# Patient Record
Sex: Male | Born: 1955 | Race: Black or African American | Hispanic: No | Marital: Married | Smoking: Former smoker
Health system: Southern US, Community
[De-identification: ages and names within clinical notes are randomized; demographics above are authoritative.]

## PROBLEM LIST (undated history)

## (undated) DIAGNOSIS — N400 Enlarged prostate without lower urinary tract symptoms: Secondary | ICD-10-CM

## (undated) DIAGNOSIS — E7800 Pure hypercholesterolemia, unspecified: Secondary | ICD-10-CM

## (undated) DIAGNOSIS — I10 Essential (primary) hypertension: Secondary | ICD-10-CM

## (undated) DIAGNOSIS — E119 Type 2 diabetes mellitus without complications: Secondary | ICD-10-CM

## (undated) DIAGNOSIS — I340 Nonrheumatic mitral (valve) insufficiency: Secondary | ICD-10-CM

## (undated) DIAGNOSIS — G709 Myoneural disorder, unspecified: Secondary | ICD-10-CM

## (undated) HISTORY — PX: APPENDECTOMY: SHX54

## (undated) HISTORY — DX: Type 2 diabetes mellitus without complications: E11.9

## (undated) HISTORY — DX: Myoneural disorder, unspecified: G70.9

## (undated) HISTORY — PX: GLAUCOMA SURGERY: SHX656

## (undated) HISTORY — PX: LEG SURGERY: SHX1003

---

## 2017-12-09 ENCOUNTER — Encounter: Payer: Self-pay | Admitting: Family Medicine

## 2017-12-16 ENCOUNTER — Other Ambulatory Visit: Payer: Self-pay

## 2017-12-16 ENCOUNTER — Encounter: Payer: Self-pay | Admitting: Nurse Practitioner

## 2017-12-16 ENCOUNTER — Ambulatory Visit (INDEPENDENT_AMBULATORY_CARE_PROVIDER_SITE_OTHER): Payer: Medicare HMO | Admitting: Nurse Practitioner

## 2017-12-16 VITALS — BP 139/88 | HR 70 | Temp 98.1°F | Ht 68.0 in | Wt 227.5 lb

## 2017-12-16 DIAGNOSIS — I10 Essential (primary) hypertension: Secondary | ICD-10-CM | POA: Diagnosis not present

## 2017-12-16 DIAGNOSIS — I152 Hypertension secondary to endocrine disorders: Secondary | ICD-10-CM | POA: Insufficient documentation

## 2017-12-16 DIAGNOSIS — E6609 Other obesity due to excess calories: Secondary | ICD-10-CM | POA: Diagnosis not present

## 2017-12-16 DIAGNOSIS — N401 Enlarged prostate with lower urinary tract symptoms: Secondary | ICD-10-CM

## 2017-12-16 DIAGNOSIS — E669 Obesity, unspecified: Secondary | ICD-10-CM | POA: Insufficient documentation

## 2017-12-16 DIAGNOSIS — E1159 Type 2 diabetes mellitus with other circulatory complications: Secondary | ICD-10-CM | POA: Insufficient documentation

## 2017-12-16 DIAGNOSIS — G709 Myoneural disorder, unspecified: Secondary | ICD-10-CM | POA: Diagnosis not present

## 2017-12-16 DIAGNOSIS — Z23 Encounter for immunization: Secondary | ICD-10-CM

## 2017-12-16 DIAGNOSIS — R351 Nocturia: Secondary | ICD-10-CM

## 2017-12-16 DIAGNOSIS — Z6834 Body mass index (BMI) 34.0-34.9, adult: Secondary | ICD-10-CM

## 2017-12-16 DIAGNOSIS — N400 Enlarged prostate without lower urinary tract symptoms: Secondary | ICD-10-CM | POA: Insufficient documentation

## 2017-12-16 MED ORDER — BACLOFEN 10 MG PO TABS: 10.0000 mg | ORAL_TABLET | Freq: Three times a day (TID) | ORAL | 3 refills | Status: DC

## 2017-12-16 MED ORDER — FINASTERIDE 5 MG PO TABS: 5.0000 mg | ORAL_TABLET | Freq: Every day | ORAL | 3 refills | Status: DC

## 2017-12-16 MED ORDER — TAMSULOSIN HCL 0.4 MG PO CAPS: 0.4000 mg | ORAL_CAPSULE | Freq: Every day | ORAL | 3 refills | Status: DC

## 2017-12-16 NOTE — Assessment & Plan Note (Signed)
Refills sent to pharmacy on Finasteride and Tamsulosin, which pt reports good symptom control with.  Has been out of medication for 4 weeks.  Monitor symptoms and refer to urology as needed.  Labs next visit.

## 2017-12-16 NOTE — Assessment & Plan Note (Signed)
Will have records sent form [place].  Continue Baclofen TID as needed for muscle spasms.  Return in 2-4 weeks for physical with lab work.

## 2017-12-16 NOTE — Assessment & Plan Note (Signed)
Discussed focus on exercise and diet regimen.  Exercise for 30 minutes 5 days a week.

## 2017-12-16 NOTE — Assessment & Plan Note (Signed)
Reports h/o BP medication use.  Not currently.  BP slightly elevated today.   Continue to monitor and initiate medication if continues above goal (<130/90).  Lab work at next visit.

## 2017-12-16 NOTE — Progress Notes (Signed)
Ryan Schmidt   New Patient Office Visit  Subjective:  Patient ID: Ryan Schmidt, male    DOB: [birth year]  Age: 62 y.o. MRN: [id number]  CC:  Chief Complaint  Patient presents with  . Establish Care    pt states he has lost all his medications on his way here from [place] about 4 weeks ago    HPI Ryan Schmidt is a 62yo African American male who presents for new patient visit to establish care.  Introduced to Publishing rights manager role and practice setting.  He reports he lost all of his medications on way here from [place] 4 weeks ago.  Of note he was on Tramadol and discussed policy of office with opioid use, he reports this was an old medication on his list and he has not taken Tramadol in "years".  Obtained care at [facility] clinic in [place], [place].  He reports his wife's aunt got sick and she came to assist her, then informed him she was going to stay here. He moved down here to be with his wife and left all his belongings behind.  BPH Has been off his BPH medications for 4 weeks, due to losing them in [place].  Has had increased symptoms at this time and requests refill.  Reports good control with medication regimen.  He reports h/o HTN, but states his provider in [place] took him of BP medications and initiated BPH meds which have helped his BP.  BPH status: stable Satisfied with current treatment?: yes Medication side effects: no Medication compliance: good compliance Duration: chronic Nocturia: 1-2x per night Urinary frequency:yes Incomplete voiding: no Urgency: yes Weak urinary stream: yes Straining to start stream: yes Dysuria: no Onset: gradual Severity: mild Alleviating factors:  Aggravating factors:  Treatments attempted:  AUA symptom score:   ACUTE MUSCLE DISORDER: He reports he has an acute muscle disorder, which his son also has.  His provider in [place] had him on Baclofen 10 MG three times, which he reports he did not always take three times a day as he does not like taking  pills.  He denies pain at this time.  Has signed form to have records sent to provider from [place].  Was diagnosed 20 or more years ago per his report.  Reports he is retired, continues to exercise a few times a week, and has no issues with function and ADL's.  Functional Status Survey: Is the patient deaf or have difficulty hearing?: No Does the patient have difficulty seeing, even when wearing glasses/contacts?: No Does the patient have difficulty concentrating, remembering, or making decisions?: No Does the patient have difficulty walking or climbing stairs?: No Does the patient have difficulty dressing or bathing?: No Does the patient have difficulty doing errands alone such as visiting a doctor's office or shopping?: No   Depression screen PHQ 2/9 12/16/2017  Decreased Interest 0  Down, Depressed, Hopeless 0  PHQ - 2 Score 0  Altered sleeping 2  Tired, decreased energy 1  Change in appetite 0  Feeling bad or failure about yourself  0  Trouble concentrating 0  Moving slowly or fidgety/restless 0  Suicidal thoughts 0  PHQ-9 Score 3  Difficult doing work/chores Not difficult at all   GAD 7 : Generalized Anxiety Score 12/16/2017  Nervous, Anxious, on Edge 1  Control/stop worrying 1  Worry too much - different things 2  Trouble relaxing 2  Restless 0  Easily annoyed or irritable 1  Afraid - awful might happen 0  Total GAD  7 Score 7  Anxiety Difficulty Not difficult at all    Past Medical History:  Diagnosis Date  . Neuromuscular disorder [facility](HCC)     Past Surgical History:  Procedure Laterality Date  . APPENDECTOMY    . LEG SURGERY     at age 62    Family History  Problem Relation Age of Onset  . Cancer Mother     Social History   Socioeconomic History  . Marital status: Married    Spouse name: Not on file  . Number of children: Not on file  . Years of education: Not on file  . Highest education level: Not on file  Occupational History  . Not on file  Social  Needs  . Financial resource strain: Not very hard  . Food insecurity:    Worry: Never true    Inability: Never true  . Transportation needs:    Medical: No    Non-medical: No  Tobacco Use  . Smoking status: Former Games developermoker  . Smokeless tobacco: Never Used  Substance and Sexual Activity  . Alcohol use: Not Currently  . Drug use: Not Currently  . Sexual activity: Yes  Lifestyle  . Physical activity:    Days per week: Not on file    Minutes per session: Not on file  . Stress: Not on file  Relationships  . Social connections:    Talks on phone: Not on file    Gets together: Not on file    Attends religious service: Not on file    Active member of club or organization: Not on file    Attends meetings of clubs or organizations: Not on file    Relationship status: Not on file  . Intimate partner violence:    Fear of current or ex partner: Not on file    Emotionally abused: Not on file    Physically abused: Not on file    Forced sexual activity: Not on file  Other Topics Concern  . Not on file  Social History Narrative  . Not on file    ROS Review of Systems  Constitutional: Negative for activity change, diaphoresis, fatigue and fever.  Respiratory: Negative for cough, chest tightness, shortness of breath and wheezing.   Cardiovascular: Negative for chest pain, palpitations and leg swelling.  Gastrointestinal: Negative for abdominal distention, abdominal pain, constipation, diarrhea, nausea and vomiting.  Endocrine: Negative for cold intolerance, heat intolerance, polydipsia, polyphagia and polyuria.  Musculoskeletal: Negative.   Skin: Negative.   Neurological: Negative for dizziness, syncope, weakness, light-headedness, numbness and headaches.  Psychiatric/Behavioral: Negative.     Objective:   Today's Vitals: BP 139/88   Pulse 70   Temp 98.1 F (36.7 C) (Oral)   Ht 5\' 8"  (1.727 m)   Wt 227 lb 8 oz (103.2 kg)   SpO2 97%   BMI 34.59 kg/m   Physical Exam    Constitutional: He is oriented to person, place, and time. He appears well-developed and well-nourished.  HENT:  Head: Normocephalic and atraumatic.  Right Ear: Hearing normal. No drainage.  Left Ear: Hearing normal. No drainage.  Mouth/Throat: Uvula is midline and mucous membranes are normal.  Eyes: Pupils are equal, round, and reactive to light. Conjunctivae, EOM and lids are normal. Right eye exhibits no discharge. Left eye exhibits no discharge.  Neck: Trachea normal and normal range of motion. Neck supple. No JVD present. Carotid bruit is not present. No thyromegaly present.  Cardiovascular: Normal rate, regular rhythm, S1 normal, S2 normal and  normal heart sounds. Exam reveals no gallop.  No murmur heard. Pulmonary/Chest: Effort normal and breath sounds normal.  Abdominal: Soft. Bowel sounds are normal. There is no splenomegaly or hepatomegaly.  Musculoskeletal: Normal range of motion.  Neurological: He is alert and oriented to person, place, and time. He has normal reflexes.  Skin: Skin is warm, dry and intact. Capillary refill takes less than 2 seconds. No rash noted.  Psychiatric: He has a normal mood and affect. His behavior is normal. Judgment and thought content normal.  Nursing note and vitals reviewed.   Assessment & Plan:   Problem List Items Addressed This Visit      Cardiovascular and Mediastinum   Essential hypertension    Reports h/o BP medication use.  Not currently.  BP slightly elevated today.   Continue to monitor and initiate medication if continues above goal (<130/90).  Lab work at next visit.        Nervous and Auditory   Neuromuscular disorder (HCC) - Primary    Will have records sent form [place].  Continue Baclofen TID as needed for muscle spasms.  Return in 2-4 weeks for physical with lab work.      Relevant Medications   baclofen (LIORESAL) 10 MG tablet     Genitourinary   BPH (benign prostatic hyperplasia)    Refills sent to pharmacy on  Finasteride and Tamsulosin, which pt reports good symptom control with.  Has been out of medication for 4 weeks.  Monitor symptoms and refer to urology as needed.  Labs next visit.      Relevant Medications   tamsulosin (FLOMAX) 0.4 MG CAPS capsule   finasteride (PROSCAR) 5 MG tablet     Other   Obesity    Discussed focus on exercise and diet regimen.  Exercise for 30 minutes 5 days a week.         Other Visit Diagnoses    Flu vaccine need          Outpatient Encounter Medications as of 12/16/2017  Medication Sig  . baclofen (LIORESAL) 10 MG tablet Take 1 tablet (10 mg total) by mouth 3 (three) times daily.  . finasteride (PROSCAR) 5 MG tablet Take 1 tablet (5 mg total) by mouth daily.  . tamsulosin (FLOMAX) 0.4 MG CAPS capsule Take 1 capsule (0.4 mg total) by mouth daily after supper.  . [DISCONTINUED] aspirin 81 MG tablet Take 81 mg by mouth daily.  . [DISCONTINUED] atorvastatin (LIPITOR) 10 MG tablet Take 10 mg by mouth daily.  . [DISCONTINUED] baclofen (LIORESAL) 10 MG tablet Take 10 mg by mouth 3 (three) times daily.  . [DISCONTINUED] finasteride (PROSCAR) 5 MG tablet Take 5 mg by mouth daily.  . [DISCONTINUED] tamsulosin (FLOMAX) 0.4 MG CAPS capsule Take 0.4 mg by mouth.  . [DISCONTINUED] traMADol (ULTRAM) 50 MG tablet Take by mouth every 6 (six) hours as needed.  . [DISCONTINUED] traMADol (ULTRAM-ER) 300 MG 24 hr tablet Take 300 mg by mouth daily.   No facility-administered encounter medications on file as of 12/16/2017.     Follow-up: Return in about 4 weeks (around 01/13/2018) for annual physical.   Marjie Skiff, NP

## 2017-12-16 NOTE — Patient Instructions (Signed)
Benign Prostatic Hyperplasia  Benign prostatic hyperplasia (BPH) is an enlarged prostate gland that is caused by the normal aging process and not by cancer. The prostate is a walnut-sized gland that is involved in the production of semen. It is located in front of the rectum and below the bladder. The bladder stores urine and the urethra is the tube that carries the urine out of the body. The prostate may get bigger as a man gets older.  An enlarged prostate can press on the urethra. This can make it harder to pass urine. The build-up of urine in the bladder can cause infection. Back pressure and infection may progress to bladder damage and kidney (renal) failure.  What are the causes?  This condition is part of a normal aging process. However, not all men develop problems from this condition. If the prostate enlarges away from the urethra, urine flow will not be blocked. If it enlarges toward the urethra and compresses it, there will be problems passing urine.  What increases the risk?  This condition is more likely to develop in men over the age of 50 years.  What are the signs or symptoms?  Symptoms of this condition include:  · Getting up often during the night to urinate.  · Needing to urinate frequently during the day.  · Difficulty starting urine flow.  · Decrease in size and strength of your urine stream.  · Leaking (dribbling) after urinating.  · Inability to pass urine. This needs immediate treatment.  · Inability to completely empty your bladder.  · Pain when you pass urine. This is more common if there is also an infection.  · Urinary tract infection (UTI).    How is this diagnosed?  This condition is diagnosed based on your medical history, a physical exam, and your symptoms. Tests will also be done, such as:  · A post-void bladder scan. This measures any amount of urine that may remain in your bladder after you finish urinating.  · A digital rectal exam. In a rectal exam, your health care provider  checks your prostate by putting a lubricated, gloved finger into your rectum to feel the back of your prostate gland. This exam detects the size of your gland and any abnormal lumps or growths.  · An exam of your urine (urinalysis).  · A prostate specific antigen (PSA) screening. This is a blood test used to screen for prostate cancer.  · An ultrasound. This test uses sound waves to electronically produce a picture of your prostate gland.    Your health care provider may refer you to a specialist in kidney and prostate diseases (urologist).  How is this treated?  Once symptoms begin, your health care provider will monitor your condition (active surveillance or watchful waiting). Treatment for this condition will depend on the severity of your condition. Treatment may include:  · Observation and yearly exams. This may be the only treatment needed if your condition and symptoms are mild.  · Medicines to relieve your symptoms, including:  ? Medicines to shrink the prostate.  ? Medicines to relax the muscle of the prostate.  · Surgery in severe cases. Surgery may include:  ? Prostatectomy. In this procedure, the prostate tissue is removed completely through an open incision or with a laparascope or robotics.  ? Transurethral resection of the prostate (TURP). In this procedure, a tool is inserted through the opening at the tip of the penis (urethra). It is used to cut away tissue of   the inner core of the prostate. The pieces are removed through the same opening of the penis. This removes the blockage.  ? Transurethral incision (TUIP). In this procedure, small cuts are made in the prostate. This lessens the prostate's pressure on the urethra.  ? Transurethral microwave thermotherapy (TUMT). This procedure uses microwaves to create heat. The heat destroys and removes a small amount of prostate tissue.  ? Transurethral needle ablation (TUNA). This procedure uses radio frequencies to destroy and remove a small amount of  prostate tissue.  ? Interstitial laser coagulation (ILC). This procedure uses a laser to destroy and remove a small amount of prostate tissue.  ? Transurethral electrovaporization (TUVP). This procedure uses electrodes to destroy and remove a small amount of prostate tissue.  ? Prostatic urethral lift. This procedure inserts an implant to push the lobes of the prostate away from the urethra.    Follow these instructions at home:  · Take over-the-counter and prescription medicines only as told by your health care provider.  · Monitor your symptoms for any changes. Contact your health care provider with any changes.  · Avoid drinking large amounts of liquid before going to bed or out in public.  · Avoid or reduce how much caffeine or alcohol you drink.  · Give yourself time when you urinate.  · Keep all follow-up visits as told by your health care provider. This is important.  Contact a health care provider if:  · You have unexplained back pain.  · Your symptoms do not get better with treatment.  · You develop side effects from the medicine you are taking.  · Your urine becomes very dark or has a bad smell.  · Your lower abdomen becomes distended and you have trouble passing your urine.  Get help right away if:  · You have a fever or chills.  · You suddenly cannot urinate.  · You feel lightheaded, or very dizzy, or you faint.  · There are large amounts of blood or clots in the urine.  · Your urinary problems become hard to manage.  · You develop moderate to severe low back or flank pain. The flank is the side of your body between the ribs and the hip.  These symptoms may represent a serious problem that is an emergency. Do not wait to see if the symptoms will go away. Get medical help right away. Call your local emergency services (911 in the [place] Do not drive yourself to the hospital.  Summary  · Benign prostatic hyperplasia (BPH) is an enlarged prostate that is caused by the normal aging process and not by  cancer.  · An enlarged prostate can press on the urethra. This can make it hard to pass urine.  · This condition is part of a normal aging process and is more likely to develop in men over the age of 50 years.  · Get help right away if you suddenly cannot urinate.  This information is not intended to replace advice given to you by your health care provider. Make sure you discuss any questions you have with your health care provider.  Document Released: 12/23/2004 Document Revised: 01/28/2016 Document Reviewed: 01/28/2016  Elsevier Interactive Patient Education © 2018 [employer]

## 2017-12-21 ENCOUNTER — Ambulatory Visit: Payer: Self-pay | Admitting: Nurse Practitioner

## 2018-01-13 ENCOUNTER — Encounter: Payer: Self-pay | Admitting: Nurse Practitioner

## 2018-01-15 ENCOUNTER — Encounter: Payer: Self-pay | Admitting: Nurse Practitioner

## 2018-01-20 ENCOUNTER — Encounter: Payer: Self-pay | Admitting: Nurse Practitioner

## 2018-01-20 ENCOUNTER — Ambulatory Visit (INDEPENDENT_AMBULATORY_CARE_PROVIDER_SITE_OTHER): Payer: Medicare HMO | Admitting: Nurse Practitioner

## 2018-01-20 ENCOUNTER — Other Ambulatory Visit: Payer: Self-pay

## 2018-01-20 VITALS — BP 138/76 | HR 71 | Temp 98.1°F | Ht 68.0 in | Wt 235.0 lb

## 2018-01-20 DIAGNOSIS — Z0000 Encounter for general adult medical examination without abnormal findings: Secondary | ICD-10-CM

## 2018-01-20 DIAGNOSIS — G709 Myoneural disorder, unspecified: Secondary | ICD-10-CM | POA: Diagnosis not present

## 2018-01-20 DIAGNOSIS — E6609 Other obesity due to excess calories: Secondary | ICD-10-CM

## 2018-01-20 DIAGNOSIS — I10 Essential (primary) hypertension: Secondary | ICD-10-CM | POA: Diagnosis not present

## 2018-01-20 DIAGNOSIS — R351 Nocturia: Secondary | ICD-10-CM

## 2018-01-20 DIAGNOSIS — E785 Hyperlipidemia, unspecified: Secondary | ICD-10-CM | POA: Insufficient documentation

## 2018-01-20 DIAGNOSIS — Z6834 Body mass index (BMI) 34.0-34.9, adult: Secondary | ICD-10-CM

## 2018-01-20 DIAGNOSIS — Z23 Encounter for immunization: Secondary | ICD-10-CM

## 2018-01-20 DIAGNOSIS — Z114 Encounter for screening for human immunodeficiency virus [HIV]: Secondary | ICD-10-CM

## 2018-01-20 DIAGNOSIS — N401 Enlarged prostate with lower urinary tract symptoms: Secondary | ICD-10-CM

## 2018-01-20 DIAGNOSIS — E782 Mixed hyperlipidemia: Secondary | ICD-10-CM | POA: Diagnosis not present

## 2018-01-20 DIAGNOSIS — Z1159 Encounter for screening for other viral diseases: Secondary | ICD-10-CM

## 2018-01-20 DIAGNOSIS — Z125 Encounter for screening for malignant neoplasm of prostate: Secondary | ICD-10-CM

## 2018-01-20 DIAGNOSIS — Z1329 Encounter for screening for other suspected endocrine disorder: Secondary | ICD-10-CM

## 2018-01-20 DIAGNOSIS — E1169 Type 2 diabetes mellitus with other specified complication: Secondary | ICD-10-CM | POA: Insufficient documentation

## 2018-01-20 MED ORDER — ZOSTER VAC RECOMB ADJUVANTED 50 MCG/0.5ML IM SUSR: 0.5000 mL | Freq: Once | INTRAMUSCULAR | 0 refills | Status: AC

## 2018-01-20 MED ORDER — TAMSULOSIN HCL 0.4 MG PO CAPS: 0.4000 mg | ORAL_CAPSULE | Freq: Every day | ORAL | 3 refills | Status: DC

## 2018-01-20 NOTE — Assessment & Plan Note (Signed)
Awaiting records from [place].  Continue Baclofen.  Thyroid screen today.

## 2018-01-20 NOTE — Progress Notes (Signed)
BP 138/76 (BP Location: Left Arm, Patient Position: Sitting)   Pulse 71   Temp 98.1 F (36.7 C) (Oral)   Ht 5\' 8"  (1.727 m)   Wt 235 lb (106.6 kg)   SpO2 96%   BMI 35.73 kg/m    Subjective:    Patient ID: Ryan Schmidt, male    DOB: 11/02/1955, 63 y.o.   MRN: [id number]  HPI: Ryan Schmidt is a 63 y.o. male presenting on 01/20/2018 for comprehensive medical examination. Current medical complaints include:none  He currently lives with: Interim Problems from his last visit: no   HYPERTENSION / HYPERLIPIDEMIA He is going to call [employer] to find out which BP medication he was taking for provider to order, as he does not recall. Satisfied with current treatment? yes Duration of hypertension: years BP monitoring frequency: not checking BP range:  BP medication side effects: no Duration of hyperlipidemia: years Cholesterol medication side effects: no Cholesterol supplements: none Medication compliance: good compliance Aspirin: no Recent stressors: yes Recurrent headaches: no Visual changes: no Palpitations: no Dyspnea: no Chest pain: no Lower extremity edema: no Dizzy/lightheaded: no   BPH BPH status: controlled Satisfied with current treatment?: yes Medication side effects: no Medication compliance: good compliance Duration: years Nocturia: 1/night Urinary frequency:no Incomplete voiding: no Urgency: no Weak urinary stream: no Straining to start stream: no Dysuria: no Severity: mild  NEUROMUSCULAR DISORDER: Chronic, ongoing.  Reports no issues as long as take Baclofen, takes one in morning and one at night which helps.  His son has same muscle disorder.     Functional Status Survey: Is the patient deaf or have difficulty hearing?: No Does the patient have difficulty seeing, even when wearing glasses/contacts?: No Does the patient have difficulty concentrating, remembering, or making decisions?: No Does the patient have difficulty walking or climbing stairs?:  No Does the patient have difficulty dressing or bathing?: No Does the patient have difficulty doing errands alone such as visiting a doctor's office or shopping?: No  Depression Screen Depression screen [facility] 2/9 01/20/2018 12/16/2017  Decreased Interest 1 0  Down, Depressed, Hopeless 0 0  PHQ - 2 Score 1 0  Altered sleeping 2 2  Tired, decreased energy 1 1  Change in appetite 0 0  Feeling bad or failure about yourself  0 0  Trouble concentrating 0 0  Moving slowly or fidgety/restless 0 0  Suicidal thoughts 0 0  PHQ-9 Score 4 3  Difficult doing work/chores Not difficult at all Not difficult at all    Advanced Directives <no information>  Past Medical History:  Past Medical History:  Diagnosis Date  . Neuromuscular disorder [facility])     Surgical History:  Past Surgical History:  Procedure Laterality Date  . APPENDECTOMY    . LEG SURGERY     at age 31    Medications:  Current Outpatient Medications on File Prior to Visit  Medication Sig  . atorvastatin (LIPITOR) 10 MG tablet Take 10 mg by mouth daily.  . baclofen (LIORESAL) 10 MG tablet Take 1 tablet (10 mg total) by mouth 3 (three) times daily.   No current facility-administered medications on file prior to visit.     Allergies:  No Known Allergies  Social History:  Social History   Socioeconomic History  . Marital status: Married    Spouse name: Not on file  . Number of children: Not on file  . Years of education: Not on file  . Highest education level: Not on file  Occupational History  .  Not on file  Social Needs  . Financial resource strain: Not very hard  . Food insecurity:    Worry: Never true    Inability: Never true  . Transportation needs:    Medical: No    Non-medical: No  Tobacco Use  . Smoking status: Former Games developer  . Smokeless tobacco: Never Used  Substance and Sexual Activity  . Alcohol use: Not Currently  . Drug use: Not Currently  . Sexual activity: Yes  Lifestyle  . Physical  activity:    Days per week: 0 days    Minutes per session: 0 min  . Stress: Only a little  Relationships  . Social connections:    Talks on phone: More than three times a week    Gets together: More than three times a week    Attends religious service: More than 4 times per year    Active member of club or organization: No    Attends meetings of clubs or organizations: Never    Relationship status: Married  . Intimate partner violence:    Fear of current or ex partner: No    Emotionally abused: No    Physically abused: No    Forced sexual activity: No  Other Topics Concern  . Not on file  Social History Narrative  . Not on file   Social History   Tobacco Use  Smoking Status Former Smoker  Smokeless Tobacco Never Used   Social History   Substance and Sexual Activity  Alcohol Use Not Currently    Family History:  Family History  Problem Relation Age of Onset  . Cancer Mother     Past medical history, surgical history, medications, allergies, family history and social history reviewed with patient today and changes made to appropriate areas of the chart.   Review of Systems - Negative All other ROS negative except what is listed above and in the HPI.      Objective:    BP 138/76 (BP Location: Left Arm, Patient Position: Sitting)   Pulse 71   Temp 98.1 F (36.7 C) (Oral)   Ht 5\' 8"  (1.727 m)   Wt 235 lb (106.6 kg)   SpO2 96%   BMI 35.73 kg/m   Wt Readings from Last 3 Encounters:  01/20/18 235 lb (106.6 kg)  12/16/17 227 lb 8 oz (103.2 kg)    Physical Exam Vitals signs and nursing note reviewed.  Constitutional:      General: He is awake.     Appearance: He is well-developed.  HENT:     Head: Normocephalic and atraumatic.     Right Ear: Hearing, tympanic membrane, ear canal and external ear normal. No drainage.     Left Ear: Hearing, tympanic membrane, ear canal and external ear normal. No drainage.     Nose: Nose normal.     Right Sinus: No maxillary  sinus tenderness or frontal sinus tenderness.     Left Sinus: No maxillary sinus tenderness or frontal sinus tenderness.     Mouth/Throat:     Lips: Pink.     Mouth: Mucous membranes are moist.     Pharynx: Uvula midline. No oropharyngeal exudate or posterior oropharyngeal erythema.  Eyes:     General: Lids are normal.        Right eye: No discharge.        Left eye: No discharge.     Conjunctiva/sclera: Conjunctivae normal.     Pupils: Pupils are equal, round, and reactive to light.  Neck:     Musculoskeletal: Normal range of motion and neck supple.     Thyroid: No thyromegaly.     Vascular: No carotid bruit or JVD.     Trachea: Trachea normal.  Cardiovascular:     Rate and Rhythm: Normal rate and regular rhythm.     Pulses: Normal pulses.     Heart sounds: Normal heart sounds, S1 normal and S2 normal. No murmur. No gallop.   Pulmonary:     Effort: Pulmonary effort is normal.     Breath sounds: Normal breath sounds.  Abdominal:     General: Bowel sounds are normal.     Palpations: Abdomen is soft. There is no hepatomegaly or splenomegaly.  Genitourinary:    Comments: Prostate exam deferred per patient request, will obtain PSA Musculoskeletal: Normal range of motion.     Comments: Normal strength BUE and BLE.  Skin:    General: Skin is warm and dry.     Capillary Refill: Capillary refill takes less than 2 seconds.     Findings: No rash.  Neurological:     Mental Status: He is alert and oriented to person, place, and time.     Cranial Nerves: Cranial nerves are intact.     Motor: Motor function is intact.     Coordination: Coordination is intact.     Gait: Gait is intact.     Deep Tendon Reflexes: Reflexes are normal and symmetric.  Psychiatric:        Attention and Perception: Attention normal.        Mood and Affect: Mood normal.        Speech: Speech normal.        Behavior: Behavior normal. Behavior is cooperative.        Thought Content: Thought content normal.         Cognition and Memory: Cognition normal.        Judgment: Judgment normal.    No results found for this or any previous visit.    Assessment & Plan:   Problem List Items Addressed This Visit      Cardiovascular and Mediastinum   Essential hypertension    Chronic, ongoing.  Initial BP elevated with improvement on recheck.  Encouraged checking BP at home three times a week.  Adjust dose as needed.  CMP and CBC today.      Relevant Medications   atorvastatin (LIPITOR) 10 MG tablet   Other Relevant Orders   CBC with Differential/Platelet   Comprehensive metabolic panel     Nervous and Auditory   Neuromuscular disorder (HCC)    Awaiting records from [place].  Continue Baclofen.  Thyroid screen today.        Genitourinary   BPH (benign prostatic hyperplasia)    Chronic, stable with Flomax.  Continue current regimen.  PSA today.      Relevant Medications   tamsulosin (FLOMAX) 0.4 MG CAPS capsule     Other   Obesity    Focus on returning to exercise regimen and maintaining healthy diet.      Mixed hyperlipidemia    Chronic, ongoing.  Continue Atorvastatin.  Lipid panel today.      Relevant Medications   atorvastatin (LIPITOR) 10 MG tablet   Other Relevant Orders   Comprehensive metabolic panel   Lipid Panel w/o Chol/HDL Ratio    Other Visit Diagnoses    Encounter for annual physical exam    -  Primary   Need for hepatitis C screening test  Relevant Orders   Hepatitis C antibody   Encounter for screening for HIV       Relevant Orders   HIV Antibody (routine testing w rflx)   Prostate cancer screening       Relevant Orders   PSA   Thyroid disorder screen       Relevant Orders   TSH   Flu vaccine need       Relevant Orders   Flu Vaccine QUAD 36+ mos IM (Completed)       Discussed aspirin prophylaxis for myocardial infarction prevention and decision was it was not indicated  LABORATORY TESTING:  Health maintenance labs ordered today as discussed  above.   The natural history of prostate cancer and ongoing controversy regarding screening and potential treatment outcomes of prostate cancer has been discussed with the patient. The meaning of a false positive PSA and a false negative PSA has been discussed. He indicates understanding of the limitations of this screening test and wishes to proceed with screening PSA testing.   IMMUNIZATIONS:   - Tdap: Tetanus vaccination status reviewed: he thinks he received 2-3 years ago (awaiting records from past PCP) - Influenza: ordered - Pneumovax: Refused - Prevnar: Refused - Zostavax vaccine: ordered  SCREENING: - Colonoscopy: Up to date , has last year Discussed with patient purpose of the colonoscopy is to detect colon cancer at curable precancerous or early stages   - AAA Screening: Not applicable  -Hearing Test: Not applicable  -Spirometry: Not applicable   PATIENT COUNSELING:    Sexuality: Discussed sexually transmitted diseases, partner selection, use of condoms, avoidance of unintended pregnancy  and contraceptive alternatives.   Advised to avoid cigarette smoking.  I discussed with the patient that most people either abstain from alcohol or drink within safe limits (<=14/week and <=4 drinks/occasion for males, <=7/weeks and <= 3 drinks/occasion for females) and that the risk for alcohol disorders and other health effects rises proportionally with the number of drinks per week and how often a drinker exceeds daily limits.  Discussed cessation/primary prevention of drug use and availability of treatment for abuse.   Diet: Encouraged to adjust caloric intake to maintain  or achieve ideal body weight, to reduce intake of dietary saturated fat and total fat, to limit sodium intake by avoiding high sodium foods and not adding table salt, and to maintain adequate dietary potassium and calcium preferably from fresh fruits, vegetables, and low-fat dairy products.    stressed the importance  of regular exercise  Injury prevention: Discussed safety belts, safety helmets, smoke detector, smoking near bedding or upholstery.   Dental health: Discussed importance of regular tooth brushing, flossing, and dental visits.   Follow up plan: NEXT PREVENTATIVE PHYSICAL DUE IN 1 YEAR. Return in about 3 months (around 04/21/2018) for HTN/HLD.

## 2018-01-20 NOTE — Assessment & Plan Note (Signed)
Chronic, ongoing.  Continue Atorvastatin.  Lipid panel today.

## 2018-01-20 NOTE — Patient Instructions (Signed)

## 2018-01-20 NOTE — Assessment & Plan Note (Signed)
Chronic, ongoing.  Initial BP elevated with improvement on recheck.  Encouraged checking BP at home three times a week.  Adjust dose as needed.  CMP and CBC today.

## 2018-01-20 NOTE — Assessment & Plan Note (Signed)
Chronic, stable with Flomax.  Continue current regimen.  PSA today.

## 2018-01-20 NOTE — Assessment & Plan Note (Signed)
Focus on returning to exercise regimen and maintaining healthy diet.

## 2018-01-21 ENCOUNTER — Other Ambulatory Visit: Payer: Self-pay | Admitting: Nurse Practitioner

## 2018-01-21 LAB — PSA: Prostate Specific Ag, Serum: 1 ng/mL (ref 0.0–4.0)

## 2018-01-21 LAB — LIPID PANEL W/O CHOL/HDL RATIO
Cholesterol, Total: 223 mg/dL — ABNORMAL HIGH (ref 100–199)
HDL: 41 mg/dL (ref >39)
LDL CALC: 160 mg/dL — AB (ref 0–99)
Triglycerides: 112 mg/dL (ref 0–149)
VLDL Cholesterol Cal: 22 mg/dL (ref 5–40)

## 2018-01-21 LAB — CBC WITH DIFFERENTIAL/PLATELET
Basophils Absolute: 0.1 10*3/uL (ref 0.0–0.2)
Basos: 1 %
EOS (ABSOLUTE): 0.1 10*3/uL (ref 0.0–0.4)
Eos: 2 %
Hematocrit: 41.6 % (ref 37.5–51.0)
Hemoglobin: 13.7 g/dL (ref 13.0–17.7)
Immature Grans (Abs): 0 10*3/uL (ref 0.0–0.1)
Immature Granulocytes: 0 %
Lymphocytes Absolute: 2.3 10*3/uL (ref 0.7–3.1)
Lymphs: 46 %
MCH: 31 pg (ref 26.6–33.0)
MCHC: 32.9 g/dL (ref 31.5–35.7)
MCV: 94 fL (ref 79–97)
Monocytes Absolute: 0.3 10*3/uL (ref 0.1–0.9)
Monocytes: 7 %
Neutrophils Absolute: 2.2 10*3/uL (ref 1.4–7.0)
Neutrophils: 44 %
Platelets: 213 10*3/uL (ref 150–450)
RBC: 4.42 x10E6/uL (ref 4.14–5.80)
RDW: 13.3 % (ref 11.6–15.4)
WBC: 5 10*3/uL (ref 3.4–10.8)

## 2018-01-21 LAB — TSH: TSH: 1.81 u[IU]/mL (ref 0.450–4.500)

## 2018-01-21 LAB — COMPREHENSIVE METABOLIC PANEL
ALT: 19 IU/L (ref 0–44)
AST: 19 IU/L (ref 0–40)
Albumin/Globulin Ratio: 1.4 (ref 1.2–2.2)
Albumin: 4.5 g/dL (ref 3.6–4.8)
Alkaline Phosphatase: 88 IU/L (ref 39–117)
BUN/Creatinine Ratio: 10 (ref 10–24)
BUN: 9 mg/dL (ref 8–27)
Bilirubin Total: 0.6 mg/dL (ref 0.0–1.2)
CO2: 24 mmol/L (ref 20–29)
Calcium: 9.7 mg/dL (ref 8.6–10.2)
Chloride: 100 mmol/L (ref 96–106)
Creatinine, Ser: 0.92 mg/dL (ref 0.76–1.27)
GFR calc Af Amer: 102 mL/min/{1.73_m2} (ref >59)
GFR calc non Af Amer: 88 mL/min/{1.73_m2} (ref >59)
Globulin, Total: 3.3 g/dL (ref 1.5–4.5)
Glucose: 107 mg/dL — ABNORMAL HIGH (ref 65–99)
POTASSIUM: 4.5 mmol/L (ref 3.5–5.2)
Sodium: 139 mmol/L (ref 134–144)
Total Protein: 7.8 g/dL (ref 6.0–8.5)

## 2018-01-21 LAB — HIV ANTIBODY (ROUTINE TESTING W REFLEX): HIV Screen 4th Generation wRfx: NONREACTIVE

## 2018-01-21 LAB — HEPATITIS C ANTIBODY: Hep C Virus Ab: <0.1 s/co ratio (ref 0.0–0.9)

## 2018-01-21 MED ORDER — ATORVASTATIN CALCIUM 20 MG PO TABS: 20.0000 mg | ORAL_TABLET | Freq: Every day | ORAL | 3 refills | Status: DC

## 2018-01-21 NOTE — Progress Notes (Signed)
Increase Atorvastatin to 20 MG based on recent labs.

## 2018-01-22 ENCOUNTER — Telehealth: Payer: Self-pay

## 2018-01-22 ENCOUNTER — Other Ambulatory Visit: Payer: Self-pay | Admitting: Nurse Practitioner

## 2018-01-22 MED ORDER — ATORVASTATIN CALCIUM 40 MG PO TABS: 40.0000 mg | ORAL_TABLET | Freq: Every day | ORAL | 3 refills | Status: DC

## 2018-01-22 MED ORDER — AMLODIPINE BESYLATE 5 MG PO TABS: 5.0000 mg | ORAL_TABLET | Freq: Every day | ORAL | 3 refills | Status: DC

## 2018-01-22 MED ORDER — BACLOFEN 10 MG PO TABS: 10.0000 mg | ORAL_TABLET | Freq: Three times a day (TID) | ORAL | 3 refills | Status: DC

## 2018-01-22 MED ORDER — TAMSULOSIN HCL 0.4 MG PO CAPS: 0.4000 mg | ORAL_CAPSULE | Freq: Every day | ORAL | 3 refills | Status: DC

## 2018-01-22 NOTE — Telephone Encounter (Signed)
Incoming fax from [facility] refill wanted for the following: Not all on his current med list Hydrochlorothiazide tamsulosin- sent yesterday Amlodipine  Atorvastatin - sent yesterday baclofen

## 2018-01-22 NOTE — Telephone Encounter (Signed)
I don't have doses for these.  Can you call [employer] and find out for detail.  Thanks.

## 2018-01-22 NOTE — Telephone Encounter (Signed)
HCTZ 25mg  Tamsulosin 0.4mg  Amlodipine 5mg  Atorvastatin 40mg   Baclofen 10mg 

## 2018-01-22 NOTE — Telephone Encounter (Signed)
Thank you very much.  All has been sent:)

## 2018-01-22 NOTE — Progress Notes (Signed)
Patient and [employer] called in updated medication list and medications that require refills. Amlodipine, Baclofen, Atorvastatin, and Flomax refills sent.

## 2018-01-28 ENCOUNTER — Other Ambulatory Visit: Payer: Self-pay | Admitting: Nurse Practitioner

## 2018-01-28 MED ORDER — HYDROCHLOROTHIAZIDE 25 MG PO TABS: 25.0000 mg | ORAL_TABLET | Freq: Every day | ORAL | 3 refills | Status: DC

## 2018-01-28 NOTE — Progress Notes (Signed)
[employer] notified office that all scripts were received, except for HCTZ 25 MG.  Will send script at this time.

## 2018-01-28 NOTE — Telephone Encounter (Signed)
[employer] called and said they received all the scripts except the HCTZ 25mg . I do not see this medication on patient's list. Please advise

## 2018-01-28 NOTE — Telephone Encounter (Signed)
Sent script

## 2018-02-01 NOTE — Telephone Encounter (Signed)
Please let him know not to take then, it was on report from the company and they stated he was taking this.

## 2018-02-01 NOTE — Telephone Encounter (Signed)
Medication: hydrochlorothiazide (HYDRODIURIL) 25 MG tablet Pt states his dr in [place] advised him not to take this med. Pt states he received in mailorder and this Rx was included.  Wanted you to let you know he is not supposed to take.

## 2018-02-02 NOTE — Telephone Encounter (Signed)
Message relayed to patient. Verbalized understanding and denied questions.   

## 2018-03-08 ENCOUNTER — Telehealth: Payer: Self-pay | Admitting: Nurse Practitioner

## 2018-03-08 NOTE — Telephone Encounter (Unsigned)
Copied from CRM [id number]. Topic: Medicare AWV >> Mar 08, 2018  4:52 PM Earlyne Iba wrote: Called to schedule Medicare Annual Wellness Visit with the Nurse Health Advisor. No answer, and no machine to leave a message.  If patient returns call, please schedule AWV with [facility] any date.  Patient is due for AWV-I now.  Thank you! For any questions please contact: Trixie Rude at [phone] or Skype [email or web address]

## 2018-04-21 ENCOUNTER — Ambulatory Visit (INDEPENDENT_AMBULATORY_CARE_PROVIDER_SITE_OTHER): Payer: Medicare HMO | Admitting: Nurse Practitioner

## 2018-04-21 ENCOUNTER — Encounter: Payer: Self-pay | Admitting: Nurse Practitioner

## 2018-04-21 ENCOUNTER — Other Ambulatory Visit: Payer: Self-pay

## 2018-04-21 VITALS — BP 154/110 | Ht 68.0 in | Wt 239.0 lb

## 2018-04-21 DIAGNOSIS — I10 Essential (primary) hypertension: Secondary | ICD-10-CM | POA: Diagnosis not present

## 2018-04-21 DIAGNOSIS — E782 Mixed hyperlipidemia: Secondary | ICD-10-CM | POA: Diagnosis not present

## 2018-04-21 MED ORDER — HYDROCHLOROTHIAZIDE 25 MG PO TABS: 25.0000 mg | ORAL_TABLET | Freq: Every day | ORAL | 3 refills | Status: DC

## 2018-04-21 MED ORDER — AMLODIPINE BESYLATE 5 MG PO TABS: 5.0000 mg | ORAL_TABLET | Freq: Every day | ORAL | 3 refills | Status: DC

## 2018-04-21 NOTE — Progress Notes (Signed)
BP (!) 154/110   Ht  (1.727 m)   Wt 239 lb (108.4 kg)   BMI 36.34 kg/m    Subjective:    Patient ID: Ryan Schmidt, male    DOB: 17-Apr-1955, 63 y.o.   MRN: [id number]  HPI: Ryan Schmidt is a 63 y.o. male  Chief Complaint  Patient presents with  . Follow-up  . Hypertension  . Hyperlipidemia    . This visit was completed via telephone due to the restrictions of the COVID-19 pandemic. All issues as above were discussed and addressed but no physical exam was performed. If it was felt that the patient should be evaluated in the office, they were directed there. The patient verbally consented to this visit. Patient was unable to complete an audio/visual visit due to Lack of equipment. Due to the catastrophic nature of the COVID-19 pandemic, this visit was done through audio contact only. . Location of the patient: home . Location of the provider: work . Those involved with this call:  . Provider: Aura Dials, DNP . CMA: Wilhemena Durie, CMA . Front Desk/Registration: Adela Ports  . Time spent on call: 15 minutes on the phone discussing health concerns. 10 minutes total spent in review of patient's record and preparation of their chart.   HYPERTENSION / HYPERLIPIDEMIA Currently taking Amlodipine 5 MG daily and HCTZ 25 MG for HTN (has not been taking, reports he does not have).  For HLD Lipitor.  His BP was elevated this morning, but had not taken medication yet this morning.  Has not been monitoring daily.  He denies CP, SOB, or palpitations. Satisfied with current treatment? yes Duration of hypertension: chronic BP monitoring frequency: rarely BP range: rarely checks BP BP medication side effects: no Past BP meds: none Duration of hyperlipidemia: chronic Cholesterol medication side effects: no Cholesterol supplements: none Medication compliance: good compliance Aspirin: no Recent stressors: no Recurrent headaches: no Visual changes: no Palpitations: no  Dyspnea: no Chest pain: no Lower extremity edema: no Dizzy/lightheaded: no  Relevant past medical, surgical, family and social history reviewed and updated as indicated. Interim medical history since our last visit reviewed. Allergies and medications reviewed and updated.  Review of Systems  Constitutional: Negative for activity change, diaphoresis, fatigue and fever.  Respiratory: Negative for cough, chest tightness, shortness of breath and wheezing.   Cardiovascular: Negative for chest pain, palpitations and leg swelling.  Gastrointestinal: Negative for abdominal distention, abdominal pain, constipation, diarrhea, nausea and vomiting.  Endocrine: Negative for cold intolerance, heat intolerance, polydipsia, polyphagia and polyuria.  Musculoskeletal: Negative.   Skin: Negative.   Neurological: Negative for dizziness, syncope, weakness, light-headedness, numbness and headaches.  Psychiatric/Behavioral: Negative.     Per HPI unless specifically indicated above     Objective:    BP (!) 154/110   Ht  (1.727 m)   Wt 239 lb (108.4 kg)   BMI 36.34 kg/m   Wt Readings from Last 3 Encounters:  04/21/18 239 lb (108.4 kg)  01/20/18 235 lb (106.6 kg)  12/16/17 227 lb 8 oz (103.2 kg)    Physical Exam   Unable to obtain due to no visual access.  Results for orders placed or performed in visit on 01/20/18  CBC with Differential/Platelet  Result Value Ref Range   WBC 5.0 3.4 - 10.8 x10E3/uL   RBC 4.42 4.14 - 5.80 x10E6/uL   Hemoglobin 13.7 13.0 - 17.7 g/dL   Hematocrit 85.0 27.7 - 51.0 %   MCV 94 79 - 97 fL  MCH 31.0 26.6 - 33.0 pg   MCHC 32.9 31.5 - 35.7 g/dL   RDW 17.0 01.7 - 49.4 %   Platelets 213 150 - 450 x10E3/uL   Neutrophils 44 Not Estab. %   Lymphs 46 Not Estab. %   Monocytes 7 Not Estab. %   Eos 2 Not Estab. %   Basos 1 Not Estab. %   Neutrophils Absolute 2.2 1.4 - 7.0 x10E3/uL   Lymphocytes Absolute 2.3 0.7 - 3.1 x10E3/uL   Monocytes Absolute 0.3 0.1 - 0.9  x10E3/uL   EOS (ABSOLUTE) 0.1 0.0 - 0.4 x10E3/uL   Basophils Absolute 0.1 0.0 - 0.2 x10E3/uL   Immature Granulocytes 0 Not Estab. %   Immature Grans (Abs) 0.0 0.0 - 0.1 x10E3/uL  Comprehensive metabolic panel  Result Value Ref Range   Glucose 107 (H) 65 - 99 mg/dL   BUN 9 8 - 27 mg/dL   Creatinine, Ser 4.96 0.76 - 1.27 mg/dL   GFR calc non Af Amer 88 >59 mL/min/1.73   GFR calc Af Amer 102 >59 mL/min/1.73   BUN/Creatinine Ratio 10 10 - 24   Sodium 139 134 - 144 mmol/L   Potassium 4.5 3.5 - 5.2 mmol/L   Chloride 100 96 - 106 mmol/L   CO2 24 20 - 29 mmol/L   Calcium 9.7 8.6 - 10.2 mg/dL   Total Protein 7.8 6.0 - 8.5 g/dL   Albumin 4.5 3.6 - 4.8 g/dL   Globulin, Total 3.3 1.5 - 4.5 g/dL   Albumin/Globulin Ratio 1.4 1.2 - 2.2   Bilirubin Total 0.6 0.0 - 1.2 mg/dL   Alkaline Phosphatase 88 39 - 117 IU/L   AST 19 0 - 40 IU/L   ALT 19 0 - 44 IU/L  PSA  Result Value Ref Range   Prostate Specific Ag, Serum 1.0 0.0 - 4.0 ng/mL  Lipid Panel w/o Chol/HDL Ratio  Result Value Ref Range   Cholesterol, Total 223 (H) 100 - 199 mg/dL   Triglycerides 759 0 - 149 mg/dL   HDL 41 >16 mg/dL   VLDL Cholesterol Cal 22 5 - 40 mg/dL   LDL Calculated 384 (H) 0 - 99 mg/dL  TSH  Result Value Ref Range   TSH 1.810 0.450 - 4.500 uIU/mL  Hepatitis C antibody  Result Value Ref Range   Hep C Virus Ab <0.1 0.0 - 0.9 s/co ratio  HIV Antibody (routine testing w rflx)  Result Value Ref Range   HIV Screen 4th Generation wRfx Non Reactive Non Reactive      Assessment & Plan:   Problem List Items Addressed This Visit      Cardiovascular and Mediastinum   Essential hypertension - Primary    Chronic, not at goal.  Has not been taking HCTZ.  Refill sent on HCTZ and Amlodipine.  Recommend checking BP 3 mornings a week and documenting for next appointment.  Return in 4 weeks with log and will obtain labs prior to visit.  Adjust meds as needed to make BP goal.      Relevant Medications   amLODipine (NORVASC)  5 MG tablet   hydrochlorothiazide (HYDRODIURIL) 25 MG tablet   Other Relevant Orders   Basic Metabolic Panel (BMET)     Other   Mixed hyperlipidemia    Chronic, ongoing.  Continue current medication and recheck lipid panel in 4 weeks.      Relevant Medications   amLODipine (NORVASC) 5 MG tablet   hydrochlorothiazide (HYDRODIURIL) 25 MG tablet   Other Relevant  Orders   Lipid Panel Piccolo, [place]affey[place]ived      I discussed the assessment and treatment plan with the patient. The patient was provided an opportunity to ask questions and all were answered. The patient agreed with the plan and demonstrated an understanding of the instructions.   The patient was advised to call back or seek an in-person evaluation if the symptoms worsen or if the condition fails to improve as anticipated.   Follow up plan: Return in about 4 weeks (around 05/19/2018) for HTN/HLD.

## 2018-04-21 NOTE — Assessment & Plan Note (Signed)
Chronic, ongoing.  Continue current medication and recheck lipid panel in 4 weeks.

## 2018-04-21 NOTE — Assessment & Plan Note (Signed)
Chronic, not at goal.  Has not been taking HCTZ.  Refill sent on HCTZ and Amlodipine.  Recommend checking BP 3 mornings a week and documenting for next appointment.  Return in 4 weeks with log and will obtain labs prior to visit.  Adjust meds as needed to make BP goal.

## 2018-04-21 NOTE — Patient Instructions (Signed)
DASH Eating Plan  DASH stands for "Dietary Approaches to Stop Hypertension." The DASH eating plan is a healthy eating plan that has been shown to reduce high blood pressure (hypertension). It may also reduce your risk for type 2 diabetes, heart disease, and stroke. The DASH eating plan may also help with weight loss.  What are tips for following this plan?    General guidelines   Avoid eating more than 2,300 mg (milligrams) of salt (sodium) a day. If you have hypertension, you may need to reduce your sodium intake to 1,500 mg a day.   Limit alcohol intake to no more than 1 drink a day for nonpregnant women and 2 drinks a day for men. One drink equals 12 oz of beer, 5 oz of wine, or 1 oz of hard liquor.   Work with your health care provider to maintain a healthy body weight or to lose weight. Ask what an ideal weight is for you.   Get at least 30 minutes of exercise that causes your heart to beat faster (aerobic exercise) most days of the week. Activities may include walking, swimming, or biking.   Work with your health care provider or diet and nutrition specialist (dietitian) to adjust your eating plan to your individual calorie needs.  Reading food labels     Check food labels for the amount of sodium per serving. Choose foods with less than 5 percent of the Daily Value of sodium. Generally, foods with less than 300 mg of sodium per serving fit into this eating plan.   To find whole grains, look for the word "whole" as the first word in the ingredient list.  Shopping   Buy products labeled as "low-sodium" or "no salt added."   Buy fresh foods. Avoid canned foods and premade or frozen meals.  Cooking   Avoid adding salt when cooking. Use salt-free seasonings or herbs instead of table salt or sea salt. Check with your health care provider or pharmacist before using salt substitutes.   Do not fry foods. Cook foods using healthy methods such as baking, boiling, grilling, and broiling instead.   Cook with  heart-healthy oils, such as olive, canola, soybean, or sunflower oil.  Meal planning   Eat a balanced diet that includes:  ? 5 or more servings of fruits and vegetables each day. At each meal, try to fill half of your plate with fruits and vegetables.  ? Up to 6-8 servings of whole grains each day.  ? Less than 6 oz of lean meat, poultry, or fish each day. A 3-oz serving of meat is about the same size as a deck of cards. One egg equals 1 oz.  ? 2 servings of low-fat dairy each day.  ? A serving of nuts, seeds, or beans 5 times each week.  ? Heart-healthy fats. Healthy fats called Omega-3 fatty acids are found in foods such as flaxseeds and coldwater fish, like sardines, salmon, and mackerel.   Limit how much you eat of the following:  ? Canned or prepackaged foods.  ? Food that is high in trans fat, such as fried foods.  ? Food that is high in saturated fat, such as fatty meat.  ? Sweets, desserts, sugary drinks, and other foods with added sugar.  ? Full-fat dairy products.   Do not salt foods before eating.   Try to eat at least 2 vegetarian meals each week.   Eat more home-cooked food and less restaurant, buffet, and fast food.     When eating at a restaurant, ask that your food be prepared with less salt or no salt, if possible.  What foods are recommended?  The items listed may not be a complete list. Talk with your dietitian about what dietary choices are best for you.  Grains  Whole-grain or whole-wheat bread. Whole-grain or whole-wheat pasta. Hai rice. Oatmeal. Quinoa. Bulgur. Whole-grain and low-sodium cereals. Pita bread. Low-fat, low-sodium crackers. Whole-wheat flour tortillas.  Vegetables  Fresh or frozen vegetables (raw, steamed, roasted, or grilled). Low-sodium or reduced-sodium tomato and vegetable juice. Low-sodium or reduced-sodium tomato sauce and tomato paste. Low-sodium or reduced-sodium canned vegetables.  Fruits  All fresh, dried, or frozen fruit. Canned fruit in natural juice (without  added sugar).  Meat and other protein foods  Skinless chicken or turkey. Ground chicken or turkey. Pork with fat trimmed off. Fish and seafood. Egg whites. Dried beans, peas, or lentils. Unsalted nuts, nut butters, and seeds. Unsalted canned beans. Lean cuts of beef with fat trimmed off. Low-sodium, lean deli meat.  Dairy  Low-fat (1%) or fat-free (skim) milk. Fat-free, low-fat, or reduced-fat cheeses. Nonfat, low-sodium ricotta or cottage cheese. Low-fat or nonfat yogurt. Low-fat, low-sodium cheese.  Fats and oils  Soft margarine without trans fats. Vegetable oil. Low-fat, reduced-fat, or light mayonnaise and salad dressings (reduced-sodium). Canola, safflower, olive, soybean, and sunflower oils. Avocado.  Seasoning and other foods  Herbs. Spices. Seasoning mixes without salt. Unsalted popcorn and pretzels. Fat-free sweets.  What foods are not recommended?  The items listed may not be a complete list. Talk with your dietitian about what dietary choices are best for you.  Grains  Baked goods made with fat, such as croissants, muffins, or some breads. Dry pasta or rice meal packs.  Vegetables  Creamed or fried vegetables. Vegetables in a cheese sauce. Regular canned vegetables (not low-sodium or reduced-sodium). Regular canned tomato sauce and paste (not low-sodium or reduced-sodium). Regular tomato and vegetable juice (not low-sodium or reduced-sodium). Pickles. Olives.  Fruits  Canned fruit in a light or heavy syrup. Fried fruit. Fruit in cream or butter sauce.  Meat and other protein foods  Fatty cuts of meat. Ribs. Fried meat. Bacon. Sausage. Bologna and other processed lunch meats. Salami. Fatback. Hotdogs. Bratwurst. Salted nuts and seeds. Canned beans with added salt. Canned or smoked fish. Whole eggs or egg yolks. Chicken or turkey with skin.  Dairy  Whole or 2% milk, cream, and half-and-half. Whole or full-fat cream cheese. Whole-fat or sweetened yogurt. Full-fat cheese. Nondairy creamers. Whipped toppings.  Processed cheese and cheese spreads.  Fats and oils  Butter. Stick margarine. Lard. Shortening. Ghee. Bacon fat. Tropical oils, such as coconut, palm kernel, or palm oil.  Seasoning and other foods  Salted popcorn and pretzels. Onion salt, garlic salt, seasoned salt, table salt, and sea salt. Worcestershire sauce. Tartar sauce. Barbecue sauce. Teriyaki sauce. Soy sauce, including reduced-sodium. Steak sauce. Canned and packaged gravies. Fish sauce. Oyster sauce. Cocktail sauce. Horseradish that you find on the shelf. Ketchup. Mustard. Meat flavorings and tenderizers. Bouillon cubes. Hot sauce and Tabasco sauce. Premade or packaged marinades. Premade or packaged taco seasonings. Relishes. Regular salad dressings.  Where to find more information:   National Heart, Lung, and Blood Institute: www.nhlbi.nih.gov   American Heart Association: www.heart.org  Summary   The DASH eating plan is a healthy eating plan that has been shown to reduce high blood pressure (hypertension). It may also reduce your risk for type 2 diabetes, heart disease, and stroke.   With the   DASH eating plan, you should limit salt (sodium) intake to 2,300 mg a day. If you have hypertension, you may need to reduce your sodium intake to 1,500 mg a day.   When on the DASH eating plan, aim to eat more fresh fruits and vegetables, whole grains, lean proteins, low-fat dairy, and heart-healthy fats.   Work with your health care provider or diet and nutrition specialist (dietitian) to adjust your eating plan to your individual calorie needs.  This information is not intended to replace advice given to you by your health care provider. Make sure you discuss any questions you have with your health care provider.  Document Released: 12/12/2010 Document Revised: 12/17/2015 Document Reviewed: 12/17/2015  Elsevier Interactive Patient Education  2019 [employer]

## 2018-05-24 ENCOUNTER — Encounter: Payer: Self-pay | Admitting: Nurse Practitioner

## 2018-05-24 ENCOUNTER — Ambulatory Visit (INDEPENDENT_AMBULATORY_CARE_PROVIDER_SITE_OTHER): Payer: Medicare HMO | Admitting: Nurse Practitioner

## 2018-05-24 ENCOUNTER — Other Ambulatory Visit: Payer: Self-pay

## 2018-05-24 ENCOUNTER — Telehealth: Payer: Self-pay | Admitting: Nurse Practitioner

## 2018-05-24 DIAGNOSIS — I10 Essential (primary) hypertension: Secondary | ICD-10-CM | POA: Diagnosis not present

## 2018-05-24 DIAGNOSIS — E782 Mixed hyperlipidemia: Secondary | ICD-10-CM | POA: Diagnosis not present

## 2018-05-24 DIAGNOSIS — R7301 Impaired fasting glucose: Secondary | ICD-10-CM | POA: Diagnosis not present

## 2018-05-24 NOTE — Patient Instructions (Signed)
DASH Eating Plan  DASH stands for "Dietary Approaches to Stop Hypertension." The DASH eating plan is a healthy eating plan that has been shown to reduce high blood pressure (hypertension). It may also reduce your risk for type 2 diabetes, heart disease, and stroke. The DASH eating plan may also help with weight loss.  What are tips for following this plan?    General guidelines   Avoid eating more than 2,300 mg (milligrams) of salt (sodium) a day. If you have hypertension, you may need to reduce your sodium intake to 1,500 mg a day.   Limit alcohol intake to no more than 1 drink a day for nonpregnant women and 2 drinks a day for men. One drink equals 12 oz of beer, 5 oz of wine, or 1 oz of hard liquor.   Work with your health care provider to maintain a healthy body weight or to lose weight. Ask what an ideal weight is for you.   Get at least 30 minutes of exercise that causes your heart to beat faster (aerobic exercise) most days of the week. Activities may include walking, swimming, or biking.   Work with your health care provider or diet and nutrition specialist (dietitian) to adjust your eating plan to your individual calorie needs.  Reading food labels     Check food labels for the amount of sodium per serving. Choose foods with less than 5 percent of the Daily Value of sodium. Generally, foods with less than 300 mg of sodium per serving fit into this eating plan.   To find whole grains, look for the word "whole" as the first word in the ingredient list.  Shopping   Buy products labeled as "low-sodium" or "no salt added."   Buy fresh foods. Avoid canned foods and premade or frozen meals.  Cooking   Avoid adding salt when cooking. Use salt-free seasonings or herbs instead of table salt or sea salt. Check with your health care provider or pharmacist before using salt substitutes.   Do not fry foods. Cook foods using healthy methods such as baking, boiling, grilling, and broiling instead.   Cook with  heart-healthy oils, such as olive, canola, soybean, or sunflower oil.  Meal planning   Eat a balanced diet that includes:  ? 5 or more servings of fruits and vegetables each day. At each meal, try to fill half of your plate with fruits and vegetables.  ? Up to 6-8 servings of whole grains each day.  ? Less than 6 oz of lean meat, poultry, or fish each day. A 3-oz serving of meat is about the same size as a deck of cards. One egg equals 1 oz.  ? 2 servings of low-fat dairy each day.  ? A serving of nuts, seeds, or beans 5 times each week.  ? Heart-healthy fats. Healthy fats called Omega-3 fatty acids are found in foods such as flaxseeds and coldwater fish, like sardines, salmon, and mackerel.   Limit how much you eat of the following:  ? Canned or prepackaged foods.  ? Food that is high in trans fat, such as fried foods.  ? Food that is high in saturated fat, such as fatty meat.  ? Sweets, desserts, sugary drinks, and other foods with added sugar.  ? Full-fat dairy products.   Do not salt foods before eating.   Try to eat at least 2 vegetarian meals each week.   Eat more home-cooked food and less restaurant, buffet, and fast food.     When eating at a restaurant, ask that your food be prepared with less salt or no salt, if possible.  What foods are recommended?  The items listed may not be a complete list. Talk with your dietitian about what dietary choices are best for you.  Grains  Whole-grain or whole-wheat bread. Whole-grain or whole-wheat pasta. Durflinger rice. Oatmeal. Quinoa. Bulgur. Whole-grain and low-sodium cereals. Pita bread. Low-fat, low-sodium crackers. Whole-wheat flour tortillas.  Vegetables  Fresh or frozen vegetables (raw, steamed, roasted, or grilled). Low-sodium or reduced-sodium tomato and vegetable juice. Low-sodium or reduced-sodium tomato sauce and tomato paste. Low-sodium or reduced-sodium canned vegetables.  Fruits  All fresh, dried, or frozen fruit. Canned fruit in natural juice (without  added sugar).  Meat and other protein foods  Skinless chicken or turkey. Ground chicken or turkey. Pork with fat trimmed off. Fish and seafood. Egg whites. Dried beans, peas, or lentils. Unsalted nuts, nut butters, and seeds. Unsalted canned beans. Lean cuts of beef with fat trimmed off. Low-sodium, lean deli meat.  Dairy  Low-fat (1%) or fat-free (skim) milk. Fat-free, low-fat, or reduced-fat cheeses. Nonfat, low-sodium ricotta or cottage cheese. Low-fat or nonfat yogurt. Low-fat, low-sodium cheese.  Fats and oils  Soft margarine without trans fats. Vegetable oil. Low-fat, reduced-fat, or light mayonnaise and salad dressings (reduced-sodium). Canola, safflower, olive, soybean, and sunflower oils. Avocado.  Seasoning and other foods  Herbs. Spices. Seasoning mixes without salt. Unsalted popcorn and pretzels. Fat-free sweets.  What foods are not recommended?  The items listed may not be a complete list. Talk with your dietitian about what dietary choices are best for you.  Grains  Baked goods made with fat, such as croissants, muffins, or some breads. Dry pasta or rice meal packs.  Vegetables  Creamed or fried vegetables. Vegetables in a cheese sauce. Regular canned vegetables (not low-sodium or reduced-sodium). Regular canned tomato sauce and paste (not low-sodium or reduced-sodium). Regular tomato and vegetable juice (not low-sodium or reduced-sodium). Pickles. Olives.  Fruits  Canned fruit in a light or heavy syrup. Fried fruit. Fruit in cream or butter sauce.  Meat and other protein foods  Fatty cuts of meat. Ribs. Fried meat. Bacon. Sausage. Bologna and other processed lunch meats. Salami. Fatback. Hotdogs. Bratwurst. Salted nuts and seeds. Canned beans with added salt. Canned or smoked fish. Whole eggs or egg yolks. Chicken or turkey with skin.  Dairy  Whole or 2% milk, cream, and half-and-half. Whole or full-fat cream cheese. Whole-fat or sweetened yogurt. Full-fat cheese. Nondairy creamers. Whipped toppings.  Processed cheese and cheese spreads.  Fats and oils  Butter. Stick margarine. Lard. Shortening. Ghee. Bacon fat. Tropical oils, such as coconut, palm kernel, or palm oil.  Seasoning and other foods  Salted popcorn and pretzels. Onion salt, garlic salt, seasoned salt, table salt, and sea salt. Worcestershire sauce. Tartar sauce. Barbecue sauce. Teriyaki sauce. Soy sauce, including reduced-sodium. Steak sauce. Canned and packaged gravies. Fish sauce. Oyster sauce. Cocktail sauce. Horseradish that you find on the shelf. Ketchup. Mustard. Meat flavorings and tenderizers. Bouillon cubes. Hot sauce and Tabasco sauce. Premade or packaged marinades. Premade or packaged taco seasonings. Relishes. Regular salad dressings.  Where to find more information:   National Heart, Lung, and Blood Institute: www.nhlbi.nih.gov   American Heart Association: www.heart.org  Summary   The DASH eating plan is a healthy eating plan that has been shown to reduce high blood pressure (hypertension). It may also reduce your risk for type 2 diabetes, heart disease, and stroke.   With the   DASH eating plan, you should limit salt (sodium) intake to 2,300 mg a day. If you have hypertension, you may need to reduce your sodium intake to 1,500 mg a day.   When on the DASH eating plan, aim to eat more fresh fruits and vegetables, whole grains, lean proteins, low-fat dairy, and heart-healthy fats.   Work with your health care provider or diet and nutrition specialist (dietitian) to adjust your eating plan to your individual calorie needs.  This information is not intended to replace advice given to you by your health care provider. Make sure you discuss any questions you have with your health care provider.  Document Released: 12/12/2010 Document Revised: 12/17/2015 Document Reviewed: 12/17/2015  Elsevier Interactive Patient Education  2019 [employer] Inc[employer]

## 2018-05-24 NOTE — Assessment & Plan Note (Signed)
Chronic, ongoing.  Continue current medication regimen, order for outpatient labs done.

## 2018-05-24 NOTE — Telephone Encounter (Signed)
Spoke with patient. He was concerned because he said he does not have a smart phone or computer only the land line.  I explained they would call on the home line.

## 2018-05-24 NOTE — Assessment & Plan Note (Signed)
Chronic, with borderline goal BPs.  Currently taking HCTZ 25 MG only.  Will continue this.  Recommend continue to monitor BP 3 mornings a week an document.  Return in 3 months for follow-up, if elevations noted or remain borderline will consider Lisinopril.  Obtain outpatient labs.  Patient agrees with plan of care.

## 2018-05-24 NOTE — Progress Notes (Signed)
There were no vitals taken for this visit.   Subjective:    Patient ID: Ryan Schmidt, male    DOB: June 23, 1955, 63 y.o.   MRN: [id number]  HPI: Ryan Schmidt is a 63 y.o. male  Chief Complaint  Patient presents with  . Hypertension    The first 3 BP reading is when he was taking both medications, the last 3 readings is without Amlodipine 04/24/18- BP:132 89 P:88      04/25/18- BP:140/87 P: 85      04/29/18 BP: 142/82 P: 81   05/17/18 BP: 131/112    05/18/18- BP 132/87 P:90   05/23/18- BP: 134/82 P: 75, patient states that when he was taking both medications he was getting dizzy, so he stopped the Amlodipine    . This visit was completed via telephone due to the restrictions of the COVID-19 pandemic. All issues as above were discussed and addressed but no physical exam was performed. If it was felt that the patient should be evaluated in the office, they were directed there. The patient verbally consented to this visit. Patient was unable to complete an audio/visual visit due to Lack of equipment. Due to the catastrophic nature of the COVID-19 pandemic, this visit was done through audio contact only. . Location of the patient: home . Location of the provider: home . Those involved with this call:  . Provider: Aura Dials, DNP . CMA: Tiffany Reel, CMA . Front Desk/Registration: Harriet Pho  . Time spent on call: 15 minutes on the phone discussing health concerns. 10 minutes total spent in review of patient's record and preparation of their chart.  I verified patient identity using two factors (patient name and date of birth). Patient consents verbally to being seen via telemedicine visit today.    HYPERTENSION / HYPERLIPIDEMIA Continues on Hydrochlorothiazide 25 MG daily and Atorvastatin 40 MG.  Reports he stopped taking Amlodipine because of episodes of nausea and dizziness with low BP.  Has taken Lisinopril in past with no ADR per patient report. Satisfied with current treatment? yes  Duration of hypertension: chronic BP monitoring frequency: daily BP range: 132/87 to 140/87 with one 131/112 BP medication side effects: no Duration of hyperlipidemia: chronic Cholesterol medication side effects: no Cholesterol supplements: none Medication compliance: good compliance Aspirin: no Recent stressors: no Recurrent headaches: no Visual changes: no Palpitations: no Dyspnea: no Chest pain: no Lower extremity edema: no Dizzy/lightheaded: yes, is better since stopping Amlodipine  Relevant past medical, surgical, family and social history reviewed and updated as indicated. Interim medical history since our last visit reviewed. Allergies and medications reviewed and updated.  Review of Systems  Constitutional: Negative for activity change, diaphoresis, fatigue and fever.  Respiratory: Negative for cough, chest tightness, shortness of breath and wheezing.   Cardiovascular: Negative for chest pain, palpitations and leg swelling.  Gastrointestinal: Negative for abdominal distention, abdominal pain, constipation, diarrhea, nausea and vomiting.  Endocrine: Negative for cold intolerance, heat intolerance, polydipsia, polyphagia and polyuria.  Musculoskeletal: Negative.   Skin: Negative.   Neurological: Negative for dizziness, syncope, weakness, light-headedness, numbness and headaches.  Psychiatric/Behavioral: Negative.     Per HPI unless specifically indicated above     Objective:    There were no vitals taken for this visit.  Wt Readings from Last 3 Encounters:  04/21/18 239 lb (108.4 kg)  01/20/18 235 lb (106.6 kg)  12/16/17 227 lb 8 oz (103.2 kg)    Physical Exam   Unable to perform physical exam due to telephone  visit only.  Results for orders placed or performed in visit on 01/20/18  CBC with Differential/Platelet  Result Value Ref Range   WBC 5.0 3.4 - 10.8 x10E3/uL   RBC 4.42 4.14 - 5.80 x10E6/uL   Hemoglobin 13.7 13.0 - 17.7 g/dL   Hematocrit 82.941.6 56.237.5 -  51.0 %   MCV 94 79 - 97 fL   MCH 31.0 26.6 - 33.0 pg   MCHC 32.9 31.5 - 35.7 g/dL   RDW 13.013.3 86.511.6 - 78.415.4 %   Platelets 213 150 - 450 x10E3/uL   Neutrophils 44 Not Estab. %   Lymphs 46 Not Estab. %   Monocytes 7 Not Estab. %   Eos 2 Not Estab. %   Basos 1 Not Estab. %   Neutrophils Absolute 2.2 1.4 - 7.0 x10E3/uL   Lymphocytes Absolute 2.3 0.7 - 3.1 x10E3/uL   Monocytes Absolute 0.3 0.1 - 0.9 x10E3/uL   EOS (ABSOLUTE) 0.1 0.0 - 0.4 x10E3/uL   Basophils Absolute 0.1 0.0 - 0.2 x10E3/uL   Immature Granulocytes 0 Not Estab. %   Immature Grans (Abs) 0.0 0.0 - 0.1 x10E3/uL  Comprehensive metabolic panel  Result Value Ref Range   Glucose 107 (H) 65 - 99 mg/dL   BUN 9 8 - 27 mg/dL   Creatinine, Ser 6.960.92 0.76 - 1.27 mg/dL   GFR calc non Af Amer 88 >59 mL/min/1.73   GFR calc Af Amer 102 >59 mL/min/1.73   BUN/Creatinine Ratio 10 10 - 24   Sodium 139 134 - 144 mmol/L   Potassium 4.5 3.5 - 5.2 mmol/L   Chloride 100 96 - 106 mmol/L   CO2 24 20 - 29 mmol/L   Calcium 9.7 8.6 - 10.2 mg/dL   Total Protein 7.8 6.0 - 8.5 g/dL   Albumin 4.5 3.6 - 4.8 g/dL   Globulin, Total 3.3 1.5 - 4.5 g/dL   Albumin/Globulin Ratio 1.4 1.2 - 2.2   Bilirubin Total 0.6 0.0 - 1.2 mg/dL   Alkaline Phosphatase 88 39 - 117 IU/L   AST 19 0 - 40 IU/L   ALT 19 0 - 44 IU/L  PSA  Result Value Ref Range   Prostate Specific Ag, Serum 1.0 0.0 - 4.0 ng/mL  Lipid Panel w/o Chol/HDL Ratio  Result Value Ref Range   Cholesterol, Total 223 (H) 100 - 199 mg/dL   Triglycerides 295112 0 - 149 mg/dL   HDL 41 >28>39 mg/dL   VLDL Cholesterol Cal 22 5 - 40 mg/dL   LDL Calculated 413160 (H) 0 - 99 mg/dL  TSH  Result Value Ref Range   TSH 1.810 0.450 - 4.500 uIU/mL  Hepatitis C antibody  Result Value Ref Range   Hep C Virus Ab <0.1 0.0 - 0.9 s/co ratio  HIV Antibody (routine testing w rflx)  Result Value Ref Range   HIV Screen 4th Generation wRfx Non Reactive Non Reactive      Assessment & Plan:   Problem List Items Addressed  This Visit      Cardiovascular and Mediastinum   Essential hypertension - Primary    Chronic, with borderline goal BPs.  Currently taking HCTZ 25 MG only.  Will continue this.  Recommend continue to monitor BP 3 mornings a week an document.  Return in 3 months for follow-up, if elevations noted or remain borderline will consider Lisinopril.  Obtain outpatient labs.  Patient agrees with plan of care.      Relevant Orders   Basic Metabolic Panel (BMET)  Other   Mixed hyperlipidemia    Chronic, ongoing.  Continue current medication regimen, order for outpatient labs done.      Relevant Orders   Lipid Panel Piccolo, Stanton    Other Visit Diagnoses    Elevated fasting glucose       Elevated on April labs, check A1C   Relevant Orders   Bayer DCA Hb A1c Waived      I discussed the assessment and treatment plan with the patient. The patient was provided an opportunity to ask questions and all were answered. The patient agreed with the plan and demonstrated an understanding of the instructions.   The patient was advised to call back or seek an in-person evaluation if the symptoms worsen or if the condition fails to improve as anticipated.   I provided 15 minutes of time during this encounter.  Follow up plan: Return in about 3 months (around 08/24/2018) for HTN/HLD.

## 2018-05-24 NOTE — Telephone Encounter (Signed)
Copied from CRM [id number]. Topic: General - Other >> May 24, 2018 12:09 PM Richarda Blade wrote: Reason for CRM: The patient has an appointment today but does not have the capabilities to do a virtual visit. The patient had transportation set up but cancelled it due to the appointment type. Please call this patient urgently.

## 2018-06-02 ENCOUNTER — Other Ambulatory Visit: Payer: Medicare HMO

## 2018-06-04 ENCOUNTER — Other Ambulatory Visit: Payer: Self-pay

## 2018-06-04 ENCOUNTER — Other Ambulatory Visit: Payer: Medicare HMO

## 2018-06-04 DIAGNOSIS — R7301 Impaired fasting glucose: Secondary | ICD-10-CM

## 2018-06-04 DIAGNOSIS — I10 Essential (primary) hypertension: Secondary | ICD-10-CM

## 2018-06-04 DIAGNOSIS — E782 Mixed hyperlipidemia: Secondary | ICD-10-CM

## 2018-06-04 LAB — LIPID PANEL PICCOLO, WAIVED
Chol/HDL Ratio Piccolo,Waive: 3.4 mg/dL
Cholesterol Piccolo, Waived: 118 mg/dL (ref <200)
HDL Chol Piccolo, Waived: 35 mg/dL — ABNORMAL LOW (ref >59)
LDL Chol Calc Piccolo Waived: 58 mg/dL (ref <100)
Triglycerides Piccolo,Waived: 126 mg/dL (ref <150)
VLDL Chol Calc Piccolo,Waive: 25 mg/dL (ref <30)

## 2018-06-04 LAB — BAYER DCA HB A1C WAIVED: HB A1C (BAYER DCA - WAIVED): 5.3 % (ref <7.0)

## 2018-06-05 LAB — BASIC METABOLIC PANEL
BUN/Creatinine Ratio: 11 (ref 10–24)
BUN: 11 mg/dL (ref 8–27)
CO2: 27 mmol/L (ref 20–29)
Calcium: 9.3 mg/dL (ref 8.6–10.2)
Chloride: 98 mmol/L (ref 96–106)
Creatinine, Ser: 1.01 mg/dL (ref 0.76–1.27)
GFR calc Af Amer: 90 mL/min/{1.73_m2} (ref >59)
GFR calc non Af Amer: 78 mL/min/{1.73_m2} (ref >59)
Glucose: 137 mg/dL — ABNORMAL HIGH (ref 65–99)
Potassium: 4.5 mmol/L (ref 3.5–5.2)
Sodium: 138 mmol/L (ref 134–144)

## 2018-06-07 NOTE — Progress Notes (Signed)
Normal test results noted.  Please call patient and make them aware of normal results and will continue to monitor at regular visits.  Have a great day.  Look forward to seeing you at your next visit.

## 2018-06-25 ENCOUNTER — Telehealth: Payer: Self-pay | Admitting: Nurse Practitioner

## 2018-06-25 ENCOUNTER — Other Ambulatory Visit: Payer: Self-pay | Admitting: Nurse Practitioner

## 2018-06-25 MED ORDER — HYDROCHLOROTHIAZIDE 25 MG PO TABS: 25.0000 mg | ORAL_TABLET | Freq: Every day | ORAL | 3 refills | Status: DC

## 2018-06-25 MED ORDER — ATORVASTATIN CALCIUM 40 MG PO TABS: 40.0000 mg | ORAL_TABLET | Freq: Every day | ORAL | 3 refills | Status: DC

## 2018-06-25 MED ORDER — TAMSULOSIN HCL 0.4 MG PO CAPS: 0.4000 mg | ORAL_CAPSULE | Freq: Every day | ORAL | 3 refills | Status: DC

## 2018-06-25 NOTE — Progress Notes (Signed)
Refills for HCTZ, Lipitor, and Flomax

## 2018-06-25 NOTE — Telephone Encounter (Signed)
Refills sent

## 2018-06-25 NOTE — Telephone Encounter (Signed)
Medication: atorvastatin (LIPITOR) 40 MG tablet [[id number]] + hydrochlorothiazide (HYDRODIURIL) 25 MG tablet [[id number]] + tamsulosin (FLOMAX) 0.4 MG CAPS capsule [[id number]]   Has the patient contacted their pharmacy? Yes  (Agent: If no, request that the patient contact the pharmacy for the refill.) (Agent: If yes, when and what did the pharmacy advise?)  Preferred Pharmacy (with phone number or street name): [facility], [place] [address] Phone: [phone] Fax: [phone]    Agent: Please be advised that RX refills may take up to 3 business days. We ask that you follow-up with your pharmacy.

## 2018-08-25 ENCOUNTER — Ambulatory Visit: Payer: Medicare HMO | Admitting: Nurse Practitioner

## 2018-08-26 ENCOUNTER — Ambulatory Visit (INDEPENDENT_AMBULATORY_CARE_PROVIDER_SITE_OTHER): Payer: Medicare HMO | Admitting: Nurse Practitioner

## 2018-08-26 ENCOUNTER — Encounter: Payer: Self-pay | Admitting: Nurse Practitioner

## 2018-08-26 ENCOUNTER — Other Ambulatory Visit: Payer: Self-pay

## 2018-08-26 VITALS — BP 127/87 | HR 88

## 2018-08-26 DIAGNOSIS — E782 Mixed hyperlipidemia: Secondary | ICD-10-CM

## 2018-08-26 DIAGNOSIS — I10 Essential (primary) hypertension: Secondary | ICD-10-CM

## 2018-08-26 NOTE — Progress Notes (Signed)
BP 127/87 Comment: pt reported  Pulse 88    Subjective:    Patient ID: Ryan Schmidt, male    DOB: [birth year], 63 y.o.   MRN: [id number]  HPI: Ryan Schmidt is a 63 y.o. male  Chief Complaint  Patient presents with  . Hyperlipidemia  . Hypertension    . This visit was completed via telephone due to the restrictions of the COVID-19 pandemic. All issues as above were discussed and addressed but no physical exam was performed. If it was felt that the patient should be evaluated in the office, they were directed there. The patient verbally consented to this visit. Patient was unable to complete an audio/visual visit due to Lack of equipment. Due to the catastrophic nature of the COVID-19 pandemic, this visit was done through audio contact only. . Location of the patient: home . Location of the provider: home . Those involved with this call:  . Provider: Marnee Guarneri, DNP . CMA: Yvonna Alanis, CMA . Front Desk/Registration: Jill Side  . Time spent on call: 15 minutes on the phone discussing health concerns. 10 minutes total spent in review of patient's record and preparation of their chart.  . I verified patient identity using two factors (patient name and date of birth). Patient consents verbally to being seen via telemedicine visit today.    HYPERTENSION / HYPERLIPIDEMIA Continues on HCTZ 25 MG daily and Atorvastatin 40 MG daily. Satisfied with current treatment? yes Duration of hypertension: chronic BP monitoring frequency: a few times a week BP range: 120-125/80's BP medication side effects: no Duration of hyperlipidemia: chronic Cholesterol medication side effects: no Cholesterol supplements: none Medication compliance: good compliance Aspirin: no Recent stressors: no Recurrent headaches: no Visual changes: no Palpitations: no Dyspnea: no Chest pain: no Lower extremity edema: no Dizzy/lightheaded: no  Relevant past medical, surgical, family and social history  reviewed and updated as indicated. Interim medical history since our last visit reviewed. Allergies and medications reviewed and updated.  Review of Systems  Constitutional: Negative for activity change, diaphoresis, fatigue and fever.  Respiratory: Negative for cough, chest tightness, shortness of breath and wheezing.   Cardiovascular: Negative for chest pain, palpitations and leg swelling.  Gastrointestinal: Negative for abdominal distention, abdominal pain, constipation, diarrhea, nausea and vomiting.  Neurological: Negative for dizziness, syncope, weakness, light-headedness, numbness and headaches.  Psychiatric/Behavioral: Negative.     Per HPI unless specifically indicated above     Objective:    BP 127/87 Comment: pt reported  Pulse 88   Wt Readings from Last 3 Encounters:  04/21/18 239 lb (108.4 kg)  01/20/18 235 lb (106.6 kg)  12/16/17 227 lb 8 oz (103.2 kg)    Physical Exam   Unable to perform due to telephone visit only.  Results for orders placed or performed in visit on 06/04/18  Lipid Panel Piccolo, [employer]  Result Value Ref Range   Cholesterol Piccolo, Waived 118 <200 mg/dL   HDL Chol Piccolo, Waived 35 (L) >59 mg/dL   Triglycerides Piccolo,Waived 126 <150 mg/dL   Chol/HDL Ratio Piccolo,Waive 3.4 mg/dL   LDL Chol Calc Piccolo Waived 58 <100 mg/dL   VLDL Chol Calc Piccolo,Waive 25 <30 mg/dL  Basic Metabolic Panel (BMET)  Result Value Ref Range   Glucose 137 (H) 65 - 99 mg/dL   BUN 11 8 - 27 mg/dL   Creatinine, Ser 1.01 0.76 - 1.27 mg/dL   GFR calc non Af Amer 78 >59 mL/min/1.73   GFR calc Af Amer 90 >59 mL/min/1.73  BUN/Creatinine Ratio 11 10 - 24   Sodium 138 134 - 144 mmol/L   Potassium 4.5 3.5 - 5.2 mmol/L   Chloride 98 96 - 106 mmol/L   CO2 27 20 - 29 mmol/L   Calcium 9.3 8.6 - 10.2 mg/dL  Bayer DCA Hb M5HA1c Waived  Result Value Ref Range   HB A1C (BAYER DCA - WAIVED) 5.3 <7.0 %      Assessment & Plan:   Problem List Items Addressed This Visit       Cardiovascular and Mediastinum   Essential hypertension - Primary    Chronic, stable with BP at goal on home readings. Continue current medication regimen and monitoring BP at home.  DASH diet recommended.  Return in 5 months for annual physical.        Other   Mixed hyperlipidemia    Chronic, stable.  Continue current medication regimen and adjust as needed.  Return in 5 months for annual physical.         I discussed the assessment and treatment plan with the patient. The patient was provided an opportunity to ask questions and all were answered. The patient agreed with the plan and demonstrated an understanding of the instructions.   The patient was advised to call back or seek an in-person evaluation if the symptoms worsen or if the condition fails to improve as anticipated.   I provided 15 minutes of time during this encounter.  Follow up plan: Return in about 5 months (around 01/26/2019) for Annual physical.

## 2018-08-26 NOTE — Patient Instructions (Signed)

## 2018-08-26 NOTE — Assessment & Plan Note (Signed)
Chronic, stable with BP at goal on home readings. Continue current medication regimen and monitoring BP at home.  DASH diet recommended.  Return in 5 months for annual physical.

## 2018-08-26 NOTE — Assessment & Plan Note (Signed)
Chronic, stable.  Continue current medication regimen and adjust as needed.  Return in 5 months for annual physical.

## 2018-09-06 ENCOUNTER — Ambulatory Visit: Payer: Self-pay | Admitting: *Deleted

## 2018-09-06 ENCOUNTER — Emergency Department
Admission: EM | Admit: 2018-09-06 | Discharge: 2018-09-06 | Disposition: A | Discharge status: Home / Self Care | Payer: Medicare HMO | Attending: Student | Admitting: Student

## 2018-09-06 ENCOUNTER — Emergency Department: Payer: Medicare HMO

## 2018-09-06 ENCOUNTER — Other Ambulatory Visit: Payer: Self-pay

## 2018-09-06 DIAGNOSIS — I10 Essential (primary) hypertension: Secondary | ICD-10-CM | POA: Insufficient documentation

## 2018-09-06 DIAGNOSIS — R0602 Shortness of breath: Secondary | ICD-10-CM | POA: Insufficient documentation

## 2018-09-06 DIAGNOSIS — R079 Chest pain, unspecified: Secondary | ICD-10-CM | POA: Diagnosis present

## 2018-09-06 DIAGNOSIS — Z79899 Other long term (current) drug therapy: Secondary | ICD-10-CM | POA: Diagnosis not present

## 2018-09-06 DIAGNOSIS — R0789 Other chest pain: Secondary | ICD-10-CM

## 2018-09-06 DIAGNOSIS — Z87891 Personal history of nicotine dependence: Secondary | ICD-10-CM | POA: Insufficient documentation

## 2018-09-06 HISTORY — DX: Pure hypercholesterolemia, unspecified: E78.00

## 2018-09-06 HISTORY — DX: Essential (primary) hypertension: I10

## 2018-09-06 LAB — TROPONIN I (HIGH SENSITIVITY)
Troponin I (High Sensitivity): 3 ng/L (ref <18)
Troponin I (High Sensitivity): 3 ng/L (ref <18)

## 2018-09-06 LAB — HEPATIC FUNCTION PANEL
ALT: 63 U/L — ABNORMAL HIGH (ref 0–44)
AST: 46 U/L — ABNORMAL HIGH (ref 15–41)
Albumin: 4.4 g/dL (ref 3.5–5.0)
Alkaline Phosphatase: 94 U/L (ref 38–126)
Bilirubin, Direct: <0.1 mg/dL (ref 0.0–0.2)
Total Bilirubin: 0.7 mg/dL (ref 0.3–1.2)
Total Protein: 8 g/dL (ref 6.5–8.1)

## 2018-09-06 LAB — CBC
HCT: 41.2 % (ref 39.0–52.0)
Hemoglobin: 14.3 g/dL (ref 13.0–17.0)
MCH: 32 pg (ref 26.0–34.0)
MCHC: 34.7 g/dL (ref 30.0–36.0)
MCV: 92.2 fL (ref 80.0–100.0)
Platelets: 219 10*3/uL (ref 150–400)
RBC: 4.47 MIL/uL (ref 4.22–5.81)
RDW: 11.9 % (ref 11.5–15.5)
WBC: 7.2 10*3/uL (ref 4.0–10.5)
nRBC: 0 % (ref 0.0–0.2)

## 2018-09-06 LAB — BASIC METABOLIC PANEL
Anion gap: 11 (ref 5–15)
BUN: 18 mg/dL (ref 8–23)
CO2: 27 mmol/L (ref 22–32)
Calcium: 9.2 mg/dL (ref 8.9–10.3)
Chloride: 97 mmol/L — ABNORMAL LOW (ref 98–111)
Creatinine, Ser: 1.02 mg/dL (ref 0.61–1.24)
GFR calc Af Amer: >60 mL/min (ref >60)
GFR calc non Af Amer: >60 mL/min (ref >60)
Glucose, Bld: 154 mg/dL — ABNORMAL HIGH (ref 70–99)
Potassium: 3.5 mmol/L (ref 3.5–5.1)
Sodium: 135 mmol/L (ref 135–145)

## 2018-09-06 LAB — LIPASE, BLOOD: Lipase: 31 U/L (ref 11–51)

## 2018-09-06 NOTE — ED Provider Notes (Signed)
[facility] Emergency Department Provider Note  ____________________________________________   First MD Initiated Contact with Patient 09/06/18 2156     (approximate)  I have reviewed the triage vital signs and the nursing notes.  History  Chief Complaint Chest Pain    HPI Ryan Schmidt is a 63 y.o. male with history of hypertension, hypercholesterolemia who presents to the emergency department for chest discomfort.  He reports mild central chest pressure that has been present and constant for 8 days.  Nothing seems to make it better or worse, but he does state he had 2 episodes on exertion in which he felt like he had some associated shortness of breath and elevation in his heart rate.  He denies any other symptoms currently.  No nausea, diaphoresis.  He has no history of DVT or PE.  No recent travel.  No family history of cardiac disease.  He does not smoke.         Past Medical Hx Past Medical History:  Diagnosis Date  . Hypercholesteremia   . Hypertension   . Neuromuscular disorder [facility])     Problem List Patient Active Problem List   Diagnosis Date Noted  . Mixed hyperlipidemia 01/20/2018  . Neuromuscular disorder ([place]) 12/16/2017  . Essential hypertension 12/16/2017  . BPH (benign prostatic hyperplasia) 12/16/2017  . Obesity 12/16/2017    Past Surgical Hx Past Surgical History:  Procedure Laterality Date  . APPENDECTOMY    . LEG SURGERY     at age 69    Medications Prior to Admission medications   Medication Sig Start Date End Date Taking? Authorizing Provider  atorvastatin (LIPITOR) 40 MG tablet Take 1 tablet (40 mg total) by mouth daily. 06/25/18   Cannady, Henrine Screws T, NP  baclofen (LIORESAL) 10 MG tablet Take 1 tablet (10 mg total) by mouth 3 (three) times daily. 01/22/18   Cannady, Henrine Screws T, NP  hydrochlorothiazide (HYDRODIURIL) 25 MG tablet Take 1 tablet (25 mg total) by mouth daily. 06/25/18   Cannady, Henrine Screws T, NP  tamsulosin  (FLOMAX) 0.4 MG CAPS capsule Take 1 capsule (0.4 mg total) by mouth daily after supper. 06/25/18   Marnee Guarneri T, NP    Allergies Ampicillin, Morphine and related, Penicillins, and Valium [diazepam]  Family Hx Family History  Problem Relation Age of Onset  . Cancer Mother     Social Hx Social History   Tobacco Use  . Smoking status: Former Research scientist (life sciences)  . Smokeless tobacco: Never Used  Substance Use Topics  . Alcohol use: Not Currently  . Drug use: Not Currently     Review of Systems  Constitutional: Negative for fever. Negative for chills. Eyes: Negative for visual changes. ENT: Negative for sore throat. Cardiovascular: + for chest pain. Respiratory: Negative for shortness of breath. Gastrointestinal: Negative for abdominal pain. Negative for nausea. Negative for vomiting. Genitourinary: Negative for dysuria. Musculoskeletal: Negative for leg swelling. Skin: Negative for rash. Neurological: Negative for for headaches.   Physical Exam  Vital Signs: ED Triage Vitals [09/06/18 1658]  Enc Vitals Group     BP 139/82     Pulse Rate 95     Resp 18     Temp 98.8 F (37.1 C)     Temp Source Oral     SpO2 100 %     Weight 260 lb (117.9 kg)     Height 5\' 9"  (1.753 m)     Head Circumference      Peak Flow  Pain Score 4     Pain Loc      Pain Edu?      Excl. in GC?     Constitutional: Alert and oriented.  Eyes: Conjunctivae clear. Sclera anicteric. Head: Normocephalic. Atraumatic. Nose: No congestion. No rhinorrhea. Mouth/Throat: Mucous membranes are moist.  Neck: No stridor.   Cardiovascular: Normal rate, regular rhythm. No murmurs. Extremities well perfused. Respiratory: Normal respiratory effort.  Lungs CTAB. Gastrointestinal: Soft and non-tender. No distention.  Musculoskeletal: No lower extremity edema. Neurologic:  Normal speech and language. No gross focal neurologic deficits are appreciated.  Skin: Skin is warm, dry and intact. No rash noted.  Psychiatric: Mood and affect are appropriate for situation.  EKG  Personally reviewed.   Rate: 98 Rhythm: sinus Axis: normal Intervals: WNL No acute ischemic changes No STEMI    Radiology  CXR: IMPRESSION: No active cardiopulmonary disease.   Procedures  Procedure(s) performed (including critical care):  Procedures   Initial Impression / Assessment and Plan / ED Course  63 y.o. male who presents to the ED for chest discomfort x8 days, as above.  Ddx: ACS, musculoskeletal, gastritis, pancreatitis.  Do not suspect PE given no tachycardia, hypoxia, or other risk factors.  Plan: Labs, EKG, imaging.  Labs without actionable derangements, negative lipase.  Negative high-sensitivity troponin.  EKG without acute ischemic changes.  Low risk HEART score.  Discussed results with patient.  Given negative work-up, will plan for discharge.  Advised PCP and cardiology follow-up, which patient is agreeable to.  Discussed return precautions.    Final Clinical Impression(s) / ED Diagnosis  Final diagnoses:  Chest pressure       Note:  This document was prepared using Dragon voice recognition software and may include unintentional dictation errors.   Miguel AschoffMonks, Mitch Arquette L., MD 09/07/18 [phone]

## 2018-09-06 NOTE — ED Triage Notes (Signed)
Chest pain for last 8 days, worse with exertion and causes pt to become SOB with exertion. Pt called PCP today and instructed to come to ER for workup. Pt alert and oriented X4, cooperative, RR even and unlabored, color WNL. Pt in NAD.

## 2018-09-06 NOTE — Telephone Encounter (Signed)
Patient notified, states that he does no have a ride, that he will call 911. Informed him to let them know he was not in  distress, that he needed to be evaluated but did not have another way to get there.

## 2018-09-06 NOTE — Telephone Encounter (Signed)
In this scenario I would recommend he go to ER today for assessment.  If he aspirated there is concern for aspiration pneumonia.  He needs assessment today to r/o aspiration pneumonia, especially if having intermittent elevations in HR too.  Thank you.

## 2018-09-06 NOTE — Discharge Instructions (Signed)
Thank you for letting us take care of you in the emergency department today.  ° °Please continue to take your regular, prescribed medications.  ° °Please follow up with: °- Your primary care doctor to review your ER visit and follow up on your symptoms.  ° °Please return to the ER for any new or worsening symptoms.  ° °

## 2018-09-06 NOTE — Telephone Encounter (Signed)
Shortness of breath for 8 days, after eating a meal and  something and went down wrong. The shortness of breath has not increased. He denies chest pain, dizziness or fever. No shortness of breath at rest, HR sometimes up to 120's.  His b/p is /normal at 117/87. He stated he has not been around anyone that tested positive for covid-19. No other symptoms of covid-19.  No wheezing, discolored nail beds, swelling in extremities or respiratory illnesses. Requesting an appointment in 3 days. Appointment scheduled per protocol. Advised to call 911 for increase in shortness of breath or any other symptoms mentioned above. Also to get tested for covid-19. He voiced understanding. Routing to [facility] for review and any other recommendation.   Reason for Disposition . [1] MODERATE longstanding difficulty breathing (e.g., speaks in phrases, SOB even at rest, pulse 100-120) AND [2] SAME as normal    Not short of breath at rest only with exertion.  Answer Assessment - Initial Assessment Questions 1. RESPIRATORY STATUS: "Describe your breathing?" (e.g., wheezing, shortness of breath, unable to speak, severe coughing)      Shortness of breath 2. ONSET: "When did this breathing problem begin?"      Started about 8 days 3. PATTERN "Does the difficult breathing come and go, or has it been constant since it started?"      Comes and goes 4. SEVERITY: "How bad is your breathing?" (e.g., mild, moderate, severe)    - MILD: No SOB at rest, mild SOB with walking, speaks normally in sentences, can lay down, no retractions, pulse < 100.    - MODERATE: SOB at rest, SOB with minimal exertion and prefers to sit, cannot lie down flat, speaks in phrases, mild retractions, audible wheezing, pulse 100-120.    - SEVERE: Very SOB at rest, speaks in single words, struggling to breathe, sitting hunched forward, retractions, pulse > 120      Mild to moderate 5. RECURRENT SYMPTOM: "Have you had difficulty breathing before?" If so, ask:  "When was the last time?" and "What happened that time?"      no 6. CARDIAC HISTORY: "Do you have any history of heart disease?" (e.g., heart attack, angina, bypass surgery, angioplasty)      Hx of HTN 7. LUNG HISTORY: "Do you have any history of lung disease?"  (e.g., pulmonary embolus, asthma, emphysema)     no 8. CAUSE: "What do you think is causing the breathing problem?"      Not sure  9. OTHER SYMPTOMS: "Do you have any other symptoms? (e.g., dizziness, runny nose, cough, chest pain, fever)     no 10. PREGNANCY: "Is there any chance you are pregnant?" "When was your last menstrual period?"       n/a 11. TRAVEL: "Have you traveled out of the country in the last month?" (e.g., travel history, exposures)       no  Protocols used: BREATHING DIFFICULTY-A-AH

## 2018-09-06 NOTE — Telephone Encounter (Signed)
FYI

## 2018-09-09 ENCOUNTER — Ambulatory Visit: Payer: Self-pay | Admitting: Nurse Practitioner

## 2018-09-10 ENCOUNTER — Other Ambulatory Visit: Payer: Self-pay

## 2018-09-10 ENCOUNTER — Ambulatory Visit (INDEPENDENT_AMBULATORY_CARE_PROVIDER_SITE_OTHER): Payer: Medicare HMO | Admitting: Nurse Practitioner

## 2018-09-10 ENCOUNTER — Encounter: Payer: Self-pay | Admitting: Nurse Practitioner

## 2018-09-10 DIAGNOSIS — I10 Essential (primary) hypertension: Secondary | ICD-10-CM | POA: Diagnosis not present

## 2018-09-10 NOTE — Patient Instructions (Signed)

## 2018-09-10 NOTE — Assessment & Plan Note (Signed)
Chronic, with stable BP readings at home.  No further chest pressure or complaints.  Continue current medication regimen.  DASH diet recommended at home.  Collaborated with Dr. Ubaldo Glassing, cardiology.  Return in 8 weeks for follow-up.

## 2018-09-10 NOTE — Progress Notes (Signed)
BP 116/76 Comment: pt reported   Pulse 85    Subjective:    Patient ID: Ryan Schmidt, male    DOB: 06/12/1955, 63 y.o.   MRN: [id number]  HPI: Ryan Schmidt is a 63 y.o. male  Chief Complaint  Patient presents with   ER Follow Up     This visit was completed via telephone due to the restrictions of the COVID-19 pandemic. All issues as above were discussed and addressed but no physical exam was performed. If it was felt that the patient should be evaluated in the office, they were directed there. The patient verbally consented to this visit. Patient was unable to complete an audio/visual visit due to telephone only. Due to the catastrophic nature of the COVID-19 pandemic, this visit was done through audio contact only.  Location of the patient: home  Location of the provider: home  Those involved with this call:   Provider: Aura DialsJolene Shelita Steptoe, DNP  CMA: Wilhemena DurieBrittany Russell, CMA  Front Desk/Registration: Harriet PhoJoliza Johnson   Time spent on call: 15 minutes on the phone discussing health concerns. 10 minutes total spent in review of patient's record and preparation of their chart.   I verified patient identity using two factors (patient name and date of birth). Patient consents verbally to being seen via telemedicine visit today.   ER FOLLOW UP Seen in ER on 09/06/2018 for chest pressure that had been present for 8 days, with 2 episodes of SOB on exertion and elevation HR.  Has underlying HTN, takes daily HCTZ for this, and HLD, takes daily Lipitor.  CXR in ER showed no activecardiopulmonary disease.  He had a negative high-sensitivity tropnonin and EKG without ischemic changes.  Is to follow-up with PCP and Dr. Lady GaryFath with cardiology.  He feels he was just nervous and scared, that is why he feels he had chest pressure.  States he was nervous and scared after swallowing a piece of chicken the wrong way, which is why he thinks he had the pressure and anxiety.  Since ER has been going for his walks  and had no SOB.  He states Dr. America BrownFath's office is supposed to call him back and make an appointment, he has called them.  Denies CP, pressure, palpitations, SOB, fever, diaphoresis.   Time since discharge: 4 days Hospital/facility: [facility] Diagnosis: chest pressure Procedures/tests: blood work, EKG, CXR Consultants: none New medications: none Discharge instructions:  Follow-up with PCP and cardiology (Dr. Lady GaryFath) Status: better  Relevant past medical, surgical, family and social history reviewed and updated as indicated. Interim medical history since our last visit reviewed. Allergies and medications reviewed and updated.  Review of Systems  Constitutional: Negative for activity change, diaphoresis, fatigue and fever.  Respiratory: Negative for cough, chest tightness, shortness of breath and wheezing.   Cardiovascular: Negative for chest pain, palpitations and leg swelling.  Gastrointestinal: Negative for abdominal distention, abdominal pain, constipation, diarrhea, nausea and vomiting.  Neurological: Negative for dizziness, syncope, weakness, light-headedness, numbness and headaches.  Psychiatric/Behavioral: Negative.     Per HPI unless specifically indicated above     Objective:    BP 116/76 Comment: pt reported   Pulse 85   Wt Readings from Last 3 Encounters:  09/06/18 260 lb (117.9 kg)  04/21/18 239 lb (108.4 kg)  01/20/18 235 lb (106.6 kg)    Physical Exam   Unable to perform due to telephone visit only.  Results for orders placed or performed during the hospital encounter of 09/06/18  Basic metabolic panel  Result Value Ref Range   Sodium 135 135 - 145 mmol/L   Potassium 3.5 3.5 - 5.1 mmol/L   Chloride 97 (L) 98 - 111 mmol/L   CO2 27 22 - 32 mmol/L   Glucose, Bld 154 (H) 70 - 99 mg/dL   BUN 18 8 - 23 mg/dL   Creatinine, Ser 1.02 0.61 - 1.24 mg/dL   Calcium 9.2 8.9 - 10.3 mg/dL   GFR calc non Af Amer >60 >60 mL/min   GFR calc Af Amer >60 >60 mL/min   Anion gap 11 5 -  15  CBC  Result Value Ref Range   WBC 7.2 4.0 - 10.5 K/uL   RBC 4.47 4.22 - 5.81 MIL/uL   Hemoglobin 14.3 13.0 - 17.0 g/dL   HCT 41.2 39.0 - 52.0 %   MCV 92.2 80.0 - 100.0 fL   MCH 32.0 26.0 - 34.0 pg   MCHC 34.7 30.0 - 36.0 g/dL   RDW 11.9 11.5 - 15.5 %   Platelets 219 150 - 400 K/uL   nRBC 0.0 0.0 - 0.2 %  Hepatic function panel  Result Value Ref Range   Total Protein 8.0 6.5 - 8.1 g/dL   Albumin 4.4 3.5 - 5.0 g/dL   AST 46 (H) 15 - 41 U/L   ALT 63 (H) 0 - 44 U/L   Alkaline Phosphatase 94 38 - 126 U/L   Total Bilirubin 0.7 0.3 - 1.2 mg/dL   Bilirubin, Direct <0.1 0.0 - 0.2 mg/dL   Indirect Bilirubin NOT CALCULATED 0.3 - 0.9 mg/dL  Lipase, blood  Result Value Ref Range   Lipase 31 11 - 51 U/L  Troponin I (High Sensitivity)  Result Value Ref Range   Troponin I (High Sensitivity) 3 <18 ng/L  Troponin I (High Sensitivity)  Result Value Ref Range   Troponin I (High Sensitivity) 3 <18 ng/L      Assessment & Plan:   Problem List Items Addressed This Visit      Cardiovascular and Mediastinum   Essential hypertension    Chronic, with stable BP readings at home.  No further chest pressure or complaints.  Continue current medication regimen.  DASH diet recommended at home.  Collaborated with Dr. Ubaldo Glassing, cardiology.  Return in 8 weeks for follow-up.          Follow up plan: Return in about 8 weeks (around 11/05/2018) for HTN follow-up in office.

## 2018-10-12 ENCOUNTER — Other Ambulatory Visit: Payer: Self-pay | Admitting: Nurse Practitioner

## 2018-10-12 NOTE — Telephone Encounter (Signed)
Requested medication (s) are due for refill today: yes  Requested medication (s) are on the active medication list: yes  Last refill:  01/29/2018  Future visit scheduled: yes  Notes to clinic:  Refill cannot be delegated    Requested Prescriptions  Pending Prescriptions Disp Refills   baclofen (LIORESAL) 10 MG tablet [Pharmacy Med Name: BACLOFEN 10 MG Tablet] 270 tablet     Sig: TAKE 1 TABLET THREE TIMES DAILY     Not Delegated - Analgesics:  Muscle Relaxants Failed - 10/12/2018 12:58 PM      Failed - This refill cannot be delegated      Passed - Valid encounter within last 6 months    Recent Outpatient Visits          1 month ago Essential hypertension   [place], Barbaraann Faster, NP   1 month ago Essential hypertension   [place], [place] T, NP   4 months ago Essential hypertension   [place], [place] T, NP   5 months ago Essential hypertension   [place], Henrine Screws T, NP   8 months ago Encounter for annual physical exam   [place], Barbaraann Faster, NP      Future Appointments            In 3 weeks Cannady, Barbaraann Faster, NP [facility] [place]   In 3 months [place], Barbaraann Faster, NP [facility]

## 2018-10-20 ENCOUNTER — Ambulatory Visit (INDEPENDENT_AMBULATORY_CARE_PROVIDER_SITE_OTHER): Payer: Medicare HMO

## 2018-10-20 VITALS — BP 124/78 | HR 102

## 2018-10-20 DIAGNOSIS — Z0000 Encounter for general adult medical examination without abnormal findings: Secondary | ICD-10-CM

## 2018-10-20 NOTE — Progress Notes (Signed)
Subjective:   Ryan Schmidt is a 63 y.o. male who presents for an Initial Medicare Annual Wellness Visit.  This visit is being conducted via phone call  - after an attmept to do on video chat - due to the COVID-19 pandemic. This patient has given me verbal consent via phone to conduct this visit, patient states they are participating from their home address. Some vital signs may be absent or patient reported.   Patient identification: identified by name, DOB, and current address.    Review of Systems   Cardiac Risk Factors include: advanced age (>5355men, 44>65 women)    Objective:    Today's Vitals   10/20/18 1431  BP: 124/78  Pulse: (!) 102   There is no height or weight on file to calculate BMI.  Advanced Directives 10/20/2018 09/06/2018  Does Patient Have a Medical Advance Directive? No No  Would patient like information on creating a medical advance directive? Yes (MAU/Ambulatory/Procedural Areas - Information given) -    Current Medications (verified) Outpatient Encounter Medications as of 10/20/2018  Medication Sig  . amLODipine (NORVASC) 5 MG tablet   . atorvastatin (LIPITOR) 40 MG tablet Take 1 tablet (40 mg total) by mouth daily.  . baclofen (LIORESAL) 10 MG tablet TAKE 1 TABLET THREE TIMES DAILY  . tamsulosin (FLOMAX) 0.4 MG CAPS capsule Take 1 capsule (0.4 mg total) by mouth daily after supper.  . [DISCONTINUED] finasteride (PROSCAR) 5 MG tablet Take by mouth.  . [DISCONTINUED] hydrochlorothiazide (HYDRODIURIL) 25 MG tablet Take 1 tablet (25 mg total) by mouth daily.   No facility-administered encounter medications on file as of 10/20/2018.     Allergies (verified) Ampicillin, Morphine and related, Penicillins, and Valium [diazepam]   History: Past Medical History:  Diagnosis Date  . Hypercholesteremia   . Hypertension   . Neuromuscular disorder [facility](HCC)    Past Surgical History:  Procedure Laterality Date  . APPENDECTOMY    . LEG SURGERY     at age 63    Family History  Problem Relation Age of Onset  . Cancer Mother    Social History   Socioeconomic History  . Marital status: Married    Spouse name: Not on file  . Number of children: Not on file  . Years of education: Not on file  . Highest education level: Not on file  Occupational History  . Not on file  Social Needs  . Financial resource strain: Not very hard  . Food insecurity    Worry: Never true    Inability: Never true  . Transportation needs    Medical: No    Non-medical: No  Tobacco Use  . Smoking status: Former Games developermoker  . Smokeless tobacco: Never Used  . Tobacco comment: quit 30 years ago   Substance and Sexual Activity  . Alcohol use: Not Currently  . Drug use: Not Currently  . Sexual activity: Yes  Lifestyle  . Physical activity    Days per week: 0 days    Minutes per session: 0 min  . Stress: Only a little  Relationships  . Social connections    Talks on phone: More than three times a week    Gets together: More than three times a week    Attends religious service: More than 4 times per year    Active member of club or organization: No    Attends meetings of clubs or organizations: Never    Relationship status: Married  Other Topics Concern  . Not  on file  Social History Narrative  . Not on file   Tobacco Counseling Counseling given: Not Answered Comment: quit 30 years ago    Clinical Intake:  Pre-visit preparation completed: Yes  Pain : No/denies pain     Nutritional Risks: None Diabetes: No  How often do you need to have someone help you when you read instructions, pamphlets, or other written materials from your doctor or pharmacy?: 1 - Never  Interpreter Needed?: No  Information entered by :: Tiffany Hill,LPN  Activities of Daily Living In your present state of health, do you have any difficulty performing the following activities: 10/20/2018 01/20/2018  Hearing? N N  Comment no hearing aids -  Vision? N N  Comment reading  glasses -  Difficulty concentrating or making decisions? N N  Walking or climbing stairs? Y N  Dressing or bathing? N N  Doing errands, shopping? Y N  Comment friend helps, Pensions consultant and eating ? N -  Using the Toilet? N -  In the past six months, have you accidently leaked urine? N -  Do you have problems with loss of bowel control? N -  Managing your Medications? N -  Managing your Finances? N -  Housekeeping or managing your Housekeeping? N -     Immunizations and Health Maintenance Immunization History  Administered Date(s) Administered  . Influenza,inj,Quad PF,6+ Mos 01/20/2018  . Influenza-Unspecified 01/20/2018   Health Maintenance Due  Topic Date Due  . INFLUENZA VACCINE  08/07/2018    Patient Care Team: Venita Lick, NP as PCP - General (Nurse Practitioner)  Indicate any recent Medical Services you may have received from other than [facility] providers in the past year (date may be approximate).    Assessment:   This is a routine wellness examination for Ryan Schmidt.  Hearing/Vision screen No exam data present  Dietary issues and exercise activities discussed: Current Exercise Habits: The patient does not participate in regular exercise at present;Home exercise routine, Type of exercise: walking(push ups), Time (Minutes): 30, Frequency (Times/Week): 3, Weekly Exercise (Minutes/Week): 90, Intensity: Mild, Exercise limited by: None identified  Goals   None    Depression Screen PHQ 2/9 Scores 10/20/2018 01/20/2018 12/16/2017  PHQ - 2 Score 0 1 0  PHQ- 9 Score - 4 3    Fall Risk Fall Risk  10/20/2018 04/21/2018  Falls in the past year? 0 0  Number falls in past yr: 0 -  Injury with Fall? 0 -  Follow up - Falls evaluation completed    [place]:  Any stairs in or around the home? Yes  If so, are there any without handrails? No   Home free of loose throw rugs in walkways, pet beds, electrical  cords, etc? Yes  Adequate lighting in your home to reduce risk of falls? Yes   ASSISTIVE DEVICES UTILIZED TO PREVENT FALLS:  Life alert? No  Use of a cane, walker or w/c? No  Grab bars in the bathroom? No  Shower chair or bench in shower? No  Elevated toilet seat or a handicapped toilet? No    TIMED UP AND GO:  Unable to perform    Cognitive Function:        Screening Tests Health Maintenance  Topic Date Due  . INFLUENZA VACCINE  08/07/2018  . TETANUS/TDAP  06/02/2019 (Originally 05/12/1974)  . COLONOSCOPY  04/20/2024  . Hepatitis C Screening  Completed  . HIV Screening  Completed  Qualifies for Shingles Vaccine? Yes  Zostavax completed n/a. Due for Shingrix. Education has been provided regarding the importance of this vaccine. Pt has been advised to call insurance company to determine out of pocket expense. Advised may also receive vaccine at local pharmacy or Health Dept. Verbalized acceptance and understanding.  Tdap: Discussed need for TD/TDAP vaccine, patient verbalized understanding that this is not covered as a preventative with there insurance and to call the office if he develops any new skin injuries, ie: cuts, scrapes, bug bites, or open wounds.  Flu Vaccine: declined   Pneumococcal Vaccine: not indicated   Cancer Screenings:  Colorectal Screening: Completed 04/20/2017  Repeat every 8 years; done in [place]  Lung Cancer Screening: (Low Dose CT Chest recommended if Age 72-80 years, 30 pack-year currently smoking OR have quit w/in 15years.) does not qualify.    Additional Screening:  Hepatitis C Screening: does qualify; Completed 01/20/2018  Vision Screening: Recommended annual ophthalmology exams for early detection of glaucoma and other disorders of the eye. Is the patient up to date with their annual eye exam?  No   Dental Screening: Recommended annual dental exams for proper oral hygiene  Community Resource Referral:  CRR required this visit?  No         Plan:  I have personally reviewed and addressed the Medicare Annual Wellness questionnaire and have noted the following in the patient's chart:  A. Medical and social history B. Use of alcohol, tobacco or illicit drugs  C. Current medications and supplements D. Functional ability and status E.  Nutritional status F.  Physical activity G. Advance directives H. List of other physicians I.  Hospitalizations, surgeries, and ER visits in previous 12 months J.  Vitals K. Screenings such as hearing and vision if needed, cognitive and depression L. Referrals and appointments   In addition, I have reviewed and discussed with patient certain preventive protocols, quality metrics, and best practice recommendations. A written personalized care plan for preventive services as well as general preventive health recommendations were provided to patient.   Signed,    Collene Schlichter, LPN   [id number]  Nurse Health Advisor   Nurse Notes: none

## 2018-10-20 NOTE — Patient Instructions (Signed)
Mr. Ryan Schmidt , Thank you for taking time to come for your Medicare Wellness Visit. I appreciate your ongoing commitment to your health goals. Please review the following plan we discussed and let me know if I can assist you in the future.   Screening recommendations/referrals: Colonoscopy: up to date Recommended yearly ophthalmology/optometry visit for glaucoma screening and checkup Recommended yearly dental visit for hygiene and checkup  Vaccinations: Influenza vaccine: to receive at next office visit  Pneumococcal vaccine: due at age 60 Tdap vaccine: due now  Shingles vaccine: shingrix eligible     Advanced directives: please pick up a copy of this information next time you are in the office   Conditions/risks identified: continue exercising at least 3 times a week for about 30 minutes  Next appointment: Follow up in one year for your annual wellness visit.   Preventive Care 40-64 Years, Male Preventive care refers to lifestyle choices and visits with your health care provider that can promote health and wellness. What does preventive care include?  A yearly physical exam. This is also called an annual well check.  Dental exams once or twice a year.  Routine eye exams. Ask your health care provider how often you should have your eyes checked.  Personal lifestyle choices, including:  Daily care of your teeth and gums.  Regular physical activity.  Eating a healthy diet.  Avoiding tobacco and drug use.  Limiting alcohol use.  Practicing safe sex.  Taking low-dose aspirin every day starting at age 73. What happens during an annual well check? The services and screenings done by your health care provider during your annual well check will depend on your age, overall health, lifestyle risk factors, and family history of disease. Counseling  Your health care provider may ask you questions about your:  Alcohol use.  Tobacco use.  Drug use.  Emotional well-being.  Home  and relationship well-being.  Sexual activity.  Eating habits.  Work and work Statistician. Screening  You may have the following tests or measurements:  Height, weight, and BMI.  Blood pressure.  Lipid and cholesterol levels. These may be checked every 5 years, or more frequently if you are over 40 years old.  Skin check.  Lung cancer screening. You may have this screening every year starting at age 27 if you have a 30-pack-year history of smoking and currently smoke or have quit within the past 15 years.  Fecal occult blood test (FOBT) of the stool. You may have this test every year starting at age 7.  Flexible sigmoidoscopy or colonoscopy. You may have a sigmoidoscopy every 5 years or a colonoscopy every 10 years starting at age 20.  Prostate cancer screening. Recommendations will vary depending on your family history and other risks.  Hepatitis C blood test.  Hepatitis B blood test.  Sexually transmitted disease (STD) testing.  Diabetes screening. This is done by checking your blood sugar (glucose) after you have not eaten for a while (fasting). You may have this done every 1-3 years. Discuss your test results, treatment options, and if necessary, the need for more tests with your health care provider. Vaccines  Your health care provider may recommend certain vaccines, such as:  Influenza vaccine. This is recommended every year.  Tetanus, diphtheria, and acellular pertussis (Tdap, Td) vaccine. You may need a Td booster every 10 years.  Zoster vaccine. You may need this after age 66.  Pneumococcal 13-valent conjugate (PCV13) vaccine. You may need this if you have certain conditions and  have not been vaccinated.  Pneumococcal polysaccharide (PPSV23) vaccine. You may need one or two doses if you smoke cigarettes or if you have certain conditions. Talk to your health care provider about which screenings and vaccines you need and how often you need them. This information  is not intended to replace advice given to you by your health care provider. Make sure you discuss any questions you have with your health care provider. Document Released: 01/19/2015 Document Revised: 09/12/2015 Document Reviewed: 10/24/2014 Elsevier Interactive Patient Education  2017 [employer].  Fall Prevention in the Home Falls can cause injuries. They can happen to people of all ages. There are many things you can do to make your home safe and to help prevent falls. What can I do on the outside of my home?  Regularly fix the edges of walkways and driveways and fix any cracks.  Remove anything that might make you trip as you walk through a door, such as a raised step or threshold.  Trim any bushes or trees on the path to your home.  Use bright outdoor lighting.  Clear any walking paths of anything that might make someone trip, such as rocks or tools.  Regularly check to see if handrails are loose or broken. Make sure that both sides of any steps have handrails.  Any raised decks and porches should have guardrails on the edges.  Have any leaves, snow, or ice cleared regularly.  Use sand or salt on walking paths during winter.  Clean up any spills in your garage right away. This includes oil or grease spills. What can I do in the bathroom?  Use night lights.  Install grab bars by the toilet and in the tub and shower. Do not use towel bars as grab bars.  Use non-skid mats or decals in the tub or shower.  If you need to sit down in the shower, use a plastic, non-slip stool.  Keep the floor dry. Clean up any water that spills on the floor as soon as it happens.  Remove soap buildup in the tub or shower regularly.  Attach bath mats securely with double-sided non-slip rug tape.  Do not have throw rugs and other things on the floor that can make you trip. What can I do in the bedroom?  Use night lights.  Make sure that you have a light by your bed that is easy to  reach.  Do not use any sheets or blankets that are too big for your bed. They should not hang down onto the floor.  Have a firm chair that has side arms. You can use this for support while you get dressed.  Do not have throw rugs and other things on the floor that can make you trip. What can I do in the kitchen?  Clean up any spills right away.  Avoid walking on wet floors.  Keep items that you use a lot in easy-to-reach places.  If you need to reach something above you, use a strong step stool that has a grab bar.  Keep electrical cords out of the way.  Do not use floor polish or wax that makes floors slippery. If you must use wax, use non-skid floor wax.  Do not have throw rugs and other things on the floor that can make you trip. What can I do with my stairs?  Do not leave any items on the stairs.  Make sure that there are handrails on both sides of the stairs and  use them. Fix handrails that are broken or loose. Make sure that handrails are as long as the stairways.  Check any carpeting to make sure that it is firmly attached to the stairs. Fix any carpet that is loose or worn.  Avoid having throw rugs at the top or bottom of the stairs. If you do have throw rugs, attach them to the floor with carpet tape.  Make sure that you have a light switch at the top of the stairs and the bottom of the stairs. If you do not have them, ask someone to add them for you. What else can I do to help prevent falls?  Wear shoes that:  Do not have high heels.  Have rubber bottoms.  Are comfortable and fit you well.  Are closed at the toe. Do not wear sandals.  If you use a stepladder:  Make sure that it is fully opened. Do not climb a closed stepladder.  Make sure that both sides of the stepladder are locked into place.  Ask someone to hold it for you, if possible.  Clearly mark and make sure that you can see:  Any grab bars or handrails.  First and last steps.  Where the  edge of each step is.  Use tools that help you move around (mobility aids) if they are needed. These include:  Canes.  Walkers.  Scooters.  Crutches.  Turn on the lights when you go into a dark area. Replace any light bulbs as soon as they burn out.  Set up your furniture so you have a clear path. Avoid moving your furniture around.  If any of your floors are uneven, fix them.  If there are any pets around you, be aware of where they are.  Review your medicines with your doctor. Some medicines can make you feel dizzy. This can increase your chance of falling. Ask your doctor what other things that you can do to help prevent falls. This information is not intended to replace advice given to you by your health care provider. Make sure you discuss any questions you have with your health care provider. Document Released: 10/19/2008 Document Revised: 05/31/2015 Document Reviewed: 01/27/2014 Elsevier Interactive Patient Education  2017 [employer]

## 2018-11-05 ENCOUNTER — Ambulatory Visit (INDEPENDENT_AMBULATORY_CARE_PROVIDER_SITE_OTHER): Payer: Medicare HMO | Admitting: Nurse Practitioner

## 2018-11-05 ENCOUNTER — Other Ambulatory Visit: Payer: Self-pay

## 2018-11-05 ENCOUNTER — Encounter: Payer: Self-pay | Admitting: Nurse Practitioner

## 2018-11-05 VITALS — BP 138/80 | HR 72 | Temp 97.5°F | Ht 69.5 in | Wt 261.0 lb

## 2018-11-05 DIAGNOSIS — N529 Male erectile dysfunction, unspecified: Secondary | ICD-10-CM | POA: Diagnosis not present

## 2018-11-05 DIAGNOSIS — Z23 Encounter for immunization: Secondary | ICD-10-CM

## 2018-11-05 DIAGNOSIS — I10 Essential (primary) hypertension: Secondary | ICD-10-CM | POA: Diagnosis not present

## 2018-11-05 NOTE — Progress Notes (Signed)
BP 138/80   Pulse 72   Temp (!) 97.5 F (36.4 C) (Oral)   Ht 5' 9.5" (1.765 m)   Wt 261 lb (118.4 kg)   SpO2 96%   BMI 37.99 kg/m    Subjective:    Patient ID: Ryan Schmidt, male    DOB: April 06, 1955, 63 y.o.   MRN: [id number]  HPI: Ryan Schmidt is a 63 y.o. male  Chief Complaint  Patient presents with  . Hypertension   HYPERTENSION Continues on Amlodipine and Atorvastatin. Hypertension status: stable  Satisfied with current treatment? yes Duration of hypertension: chronic BP monitoring frequency:  a few times a week BP range: 118-126/76-84 BP medication side effects:  no Medication compliance: good compliance Aspirin: no Recurrent headaches: no Visual changes: no Palpitations: no Dyspnea: no Chest pain: no Lower extremity edema: no Dizzy/lightheaded: no   ERECTILE DYSFUNCTION: Can obtain an erection, but is unable to maintain it.  Is unable to ejaculate as can not maintain erection.  Has been ongoing for 6 months.  His brother took Cialis and patient is worried that this caused his death, is fearful of taking this.  He reports his brother was "sick and could keep nothing down", went into surgery.  Would like testosterone checked, discussed that if low may need to come in for repeat first thing in morning and if continues to be low would send to urology.    Relevant past medical, surgical, family and social history reviewed and updated as indicated. Interim medical history since our last visit reviewed. Allergies and medications reviewed and updated.  Review of Systems  Constitutional: Negative for activity change, diaphoresis, fatigue and fever.  Respiratory: Negative for cough, chest tightness, shortness of breath and wheezing.   Cardiovascular: Negative for chest pain, palpitations and leg swelling.  Gastrointestinal: Negative for abdominal distention, abdominal pain, constipation, diarrhea, nausea and vomiting.  Endocrine: Negative for cold intolerance.   Neurological: Negative for dizziness, syncope, weakness, light-headedness, numbness and headaches.  Psychiatric/Behavioral: Negative.     Per HPI unless specifically indicated above     Objective:    BP 138/80   Pulse 72   Temp (!) 97.5 F (36.4 C) (Oral)   Ht 5' 9.5" (1.765 m)   Wt 261 lb (118.4 kg)   SpO2 96%   BMI 37.99 kg/m   Wt Readings from Last 3 Encounters:  11/05/18 261 lb (118.4 kg)  09/06/18 260 lb (117.9 kg)  04/21/18 239 lb (108.4 kg)    Physical Exam Vitals signs and nursing note reviewed.  Constitutional:      General: He is awake. He is not in acute distress.    Appearance: He is well-developed. He is obese. He is not ill-appearing.  HENT:     Head: Normocephalic and atraumatic.     Right Ear: Hearing normal. No drainage.     Left Ear: Hearing normal. No drainage.  Eyes:     General: Lids are normal.        Right eye: No discharge.        Left eye: No discharge.     Conjunctiva/sclera: Conjunctivae normal.     Pupils: Pupils are equal, round, and reactive to light.  Neck:     Musculoskeletal: Normal range of motion and neck supple.     Vascular: No carotid bruit.  Cardiovascular:     Rate and Rhythm: Normal rate and regular rhythm.     Heart sounds: Normal heart sounds, S1 normal and S2 normal. No murmur. No  gallop.   Pulmonary:     Effort: Pulmonary effort is normal. No accessory muscle usage or respiratory distress.     Breath sounds: Normal breath sounds.  Abdominal:     General: Bowel sounds are normal.     Palpations: Abdomen is soft.  Musculoskeletal: Normal range of motion.     Right lower leg: No edema.     Left lower leg: No edema.  Skin:    General: Skin is warm and dry.  Neurological:     Mental Status: He is alert and oriented to person, place, and time.     Deep Tendon Reflexes: Reflexes are normal and symmetric.  Psychiatric:        Mood and Affect: Mood normal.        Behavior: Behavior normal. Behavior is cooperative.         Thought Content: Thought content normal.        Judgment: Judgment normal.     Results for orders placed or performed during the hospital encounter of 42/70/62  Basic metabolic panel  Result Value Ref Range   Sodium 135 135 - 145 mmol/L   Potassium 3.5 3.5 - 5.1 mmol/L   Chloride 97 (L) 98 - 111 mmol/L   CO2 27 22 - 32 mmol/L   Glucose, Bld 154 (H) 70 - 99 mg/dL   BUN 18 8 - 23 mg/dL   Creatinine, Ser 1.02 0.61 - 1.24 mg/dL   Calcium 9.2 8.9 - 10.3 mg/dL   GFR calc non Af Amer >60 >60 mL/min   GFR calc Af Amer >60 >60 mL/min   Anion gap 11 5 - 15  CBC  Result Value Ref Range   WBC 7.2 4.0 - 10.5 K/uL   RBC 4.47 4.22 - 5.81 MIL/uL   Hemoglobin 14.3 13.0 - 17.0 g/dL   HCT 41.2 39.0 - 52.0 %   MCV 92.2 80.0 - 100.0 fL   MCH 32.0 26.0 - 34.0 pg   MCHC 34.7 30.0 - 36.0 g/dL   RDW 11.9 11.5 - 15.5 %   Platelets 219 150 - 400 K/uL   nRBC 0.0 0.0 - 0.2 %  Hepatic function panel  Result Value Ref Range   Total Protein 8.0 6.5 - 8.1 g/dL   Albumin 4.4 3.5 - 5.0 g/dL   AST 46 (H) 15 - 41 U/L   ALT 63 (H) 0 - 44 U/L   Alkaline Phosphatase 94 38 - 126 U/L   Total Bilirubin 0.7 0.3 - 1.2 mg/dL   Bilirubin, Direct <0.1 0.0 - 0.2 mg/dL   Indirect Bilirubin NOT CALCULATED 0.3 - 0.9 mg/dL  Lipase, blood  Result Value Ref Range   Lipase 31 11 - 51 U/L  Troponin I (High Sensitivity)  Result Value Ref Range   Troponin I (High Sensitivity) 3 <18 ng/L  Troponin I (High Sensitivity)  Result Value Ref Range   Troponin I (High Sensitivity) 3 <18 ng/L      Assessment & Plan:   Problem List Items Addressed This Visit      Cardiovascular and Mediastinum   Essential hypertension - Primary    Chronic, with stable BP readings at home and in office. Continue current medication regimen.  DASH diet recommended at home.  Collaborate with Dr. Ubaldo Glassing, cardiology.  Return in 3 months for annual physical.        Relevant Orders   Comp Met (CMET)     Other   Erectile dysfunction    Will  obtain testosterone level today per patient request and if low plan to repeat with early morning.  If ongoing low levels plan on urology referral.  Patient does not wish to take ED medication at this time due to concerns about side effects.      Relevant Orders   Testosterone, Free, Total, SHBG    Other Visit Diagnoses    Flu vaccine need       Relevant Orders   Flu Vaccine QUAD 36+ mos IM (Completed)       Follow up plan: Return in about 3 months (around 02/05/2019) for Annual physical.

## 2018-11-05 NOTE — Assessment & Plan Note (Signed)
Will obtain testosterone level today per patient request and if low plan to repeat with early morning.  If ongoing low levels plan on urology referral.  Patient does not wish to take ED medication at this time due to concerns about side effects.

## 2018-11-05 NOTE — Patient Instructions (Addendum)
Voltaren cream  Look into these medications: Viagra, Cialis  Arthritis Arthritis means joint pain. It can also mean joint disease. A joint is a place where bones come together. There are more than 100 types of arthritis. What are the causes? This condition may be caused by:  Wear and tear of a joint. This is the most common cause.  A lot of acid in the blood, which leads to pain in the joint (gout).  Pain and swelling (inflammation) in a joint.  Infection of a joint.  Injuries in the joint.  A reaction to medicines (allergy). In some cases, the cause may not be known. What are the signs or symptoms? Symptoms of this condition include:  Redness at a joint.  Swelling at a joint.  Stiffness at a joint.  Warmth coming from the joint.  A fever.  A feeling of being sick. How is this treated? This condition may be treated with:  Treating the cause, if it is known.  Rest.  Raising (elevating) the joint.  Putting cold or hot packs on the joint.  Medicines to treat symptoms and reduce pain and swelling.  Shots of medicines (cortisone) into the joint. You may also be told to make changes in your life, such as doing exercises and losing weight. Follow these instructions at home: Medicines  Take over-the-counter and prescription medicines only as told by your doctor.  Do not take aspirin for pain if your doctor says that you may have gout. Activity  Rest your joint if your doctor tells you to.  Avoid activities that make the pain worse.  Exercise your joint regularly as told by your doctor. Try doing exercises like: ? Swimming. ? Water aerobics. ? Biking. ? Walking. Managing pain, stiffness, and swelling      If told, put ice on the affected area. ? Put ice in a plastic bag. ? Place a towel between your skin and the bag. ? Leave the ice on for 20 minutes, 2-3 times per day.  If your joint is swollen, raise (elevate) it above the level of your heart if  told by your doctor.  If your joint feels stiff in the morning, try taking a warm shower.  If told, put heat on the affected area. Do this as often as told by your doctor. Use the heat source that your doctor recommends, such as a moist heat pack or a heating pad. If you have diabetes, do not apply heat without asking your doctor. To apply heat: ? Place a towel between your skin and the heat source. ? Leave the heat on for 20-30 minutes. ? Remove the heat if your skin turns bright red. This is very important if you are unable to feel pain, heat, or cold. You may have a greater risk of getting burned. General instructions  Do not use any products that contain nicotine or tobacco, such as cigarettes, e-cigarettes, and chewing tobacco. If you need help quitting, ask your doctor.  Keep all follow-up visits as told by your doctor. This is important. Contact a doctor if:  The pain gets worse.  You have a fever. Get help right away if:  You have very bad pain in your joint.  You have swelling in your joint.  Your joint is red.  Many joints become painful and swollen.  You have very bad back pain.  Your leg is very weak.  You cannot control your pee (urine) or poop (stool). Summary  Arthritis means joint pain. It  can also mean joint disease. A joint is a place where bones come together.  The most common cause of this condition is wear and tear of a joint.  Symptoms of this condition include redness, swelling, or stiffness of the joint.  This condition is treated with rest, raising the joint, medicines, and putting cold or hot packs on the joint.  Follow your doctor's instructions about medicines, activity, exercises, and other home care treatments. This information is not intended to replace advice given to you by your health care provider. Make sure you discuss any questions you have with your health care provider. Document Released: 03/19/2009 Document Revised: 11/30/2017  Document Reviewed: 11/30/2017 Elsevier Patient Education  2020 [employer].

## 2018-11-05 NOTE — Assessment & Plan Note (Addendum)
Chronic, with stable BP readings at home and in office. Continue current medication regimen.  DASH diet recommended at home.  Collaborate with Dr. Ubaldo Glassing, cardiology.  Return in 3 months for annual physical.

## 2018-11-07 LAB — COMPREHENSIVE METABOLIC PANEL
ALT: 38 IU/L (ref 0–44)
AST: 33 IU/L (ref 0–40)
Albumin/Globulin Ratio: 1.5 (ref 1.2–2.2)
Albumin: 4.6 g/dL (ref 3.8–4.8)
Alkaline Phosphatase: 110 IU/L (ref 39–117)
BUN/Creatinine Ratio: 18 (ref 10–24)
BUN: 18 mg/dL (ref 8–27)
Bilirubin Total: 0.6 mg/dL (ref 0.0–1.2)
CO2: 22 mmol/L (ref 20–29)
Calcium: 9.4 mg/dL (ref 8.6–10.2)
Chloride: 100 mmol/L (ref 96–106)
Creatinine, Ser: 1 mg/dL (ref 0.76–1.27)
GFR calc Af Amer: 92 mL/min/{1.73_m2} (ref >59)
GFR calc non Af Amer: 79 mL/min/{1.73_m2} (ref >59)
Globulin, Total: 3 g/dL (ref 1.5–4.5)
Glucose: 151 mg/dL — ABNORMAL HIGH (ref 65–99)
Potassium: 4.1 mmol/L (ref 3.5–5.2)
Sodium: 138 mmol/L (ref 134–144)
Total Protein: 7.6 g/dL (ref 6.0–8.5)

## 2018-11-07 LAB — TESTOSTERONE, FREE, TOTAL, SHBG
Sex Hormone Binding: 75.1 nmol/L (ref 19.3–76.4)
Testosterone, Free: 8.8 pg/mL (ref 6.6–18.1)
Testosterone: 472 ng/dL (ref 264–916)

## 2018-12-03 ENCOUNTER — Telehealth: Payer: Self-pay

## 2018-12-03 NOTE — Telephone Encounter (Signed)
Copied from [place] [id number]. Topic: Medical Record Request - Other >> Dec 01, 2018  3:05 PM Lennox Solders wrote: Patient Name/DOB/MRN #: Ryan Schmidt/July 07, 1955 Marchia Bond [id number] Requestor Name/Agency: Rowe Robert Call Back #: [phone] Information Requested: records from 10-20-2018   Route to [place] for [place] clinics. For all other clinics, route to the clinic's PEC Pool.

## 2018-12-03 NOTE — Telephone Encounter (Deleted)
Copied from CRM #[id number]. Topic: Medical Record Request - Other >> Dec 01, 2018  3:05 PM Robinson, Norma J wrote: Patient Name/DOB/MRN #: Ryan Schmidt/11/27/1955 /mrn [id number] Requestor Name/Agency: ryan humana Call Back #: [phone] Information Requested: records from 10-20-2018   Route to [facility] HIM Pool for [place] clinics. For all other clinics, route to the clinic's PEC Pool. 

## 2018-12-04 NOTE — Telephone Encounter (Signed)
Please see not below for record request by [facility] for 10/20/18 Medicate [facility]

## 2018-12-06 ENCOUNTER — Telehealth: Payer: Self-pay

## 2018-12-06 NOTE — Telephone Encounter (Signed)
Copied from CRM #[id number]. Topic: Medical Record Request - Other >> Dec 01, 2018  3:05 PM Robinson, Norma J wrote: Patient Name/DOB/MRN #: Bazil Common/11/07/1955 /mrn [id number] Requestor Name/Agency: ryan humana Call Back #: [phone] Information Requested: records from 10-20-2018   Route to [facility] HIM Pool for [place] clinics. For all other clinics, route to the clinic's PEC Pool. 

## 2018-12-07 NOTE — Telephone Encounter (Signed)
[facility], spoke with a reprsensative, they did not know anything about the record request, could not help me. Will close this message and see if someone else calls.

## 2019-01-13 ENCOUNTER — Telehealth: Payer: Self-pay | Admitting: Nurse Practitioner

## 2019-01-13 NOTE — Telephone Encounter (Signed)
error 

## 2019-01-14 ENCOUNTER — Other Ambulatory Visit: Payer: Self-pay

## 2019-01-14 MED ORDER — TAMSULOSIN HCL 0.4 MG PO CAPS: 0.4000 mg | ORAL_CAPSULE | Freq: Every day | ORAL | 3 refills | Status: DC

## 2019-01-14 MED ORDER — ATORVASTATIN CALCIUM 40 MG PO TABS: 40.0000 mg | ORAL_TABLET | Freq: Every day | ORAL | 3 refills | Status: DC

## 2019-01-14 MED ORDER — BACLOFEN 10 MG PO TABS: 10.0000 mg | ORAL_TABLET | Freq: Three times a day (TID) | ORAL | 2 refills | Status: DC

## 2019-01-14 NOTE — Telephone Encounter (Signed)
New pharmacy, [facility]. Patient last seen 11/05/18 and has a f/up 02/08/19

## 2019-01-24 ENCOUNTER — Other Ambulatory Visit: Payer: Self-pay | Admitting: Nurse Practitioner

## 2019-01-24 ENCOUNTER — Telehealth: Payer: Self-pay

## 2019-01-24 MED ORDER — HYDROCHLOROTHIAZIDE 25 MG PO TABS: 25.0000 mg | ORAL_TABLET | Freq: Every day | ORAL | 3 refills | Status: DC

## 2019-01-24 NOTE — Telephone Encounter (Signed)
Rx sent 

## 2019-01-24 NOTE — Telephone Encounter (Signed)
Fax from pharmacy. New Rx Request for Hydrochlorothiazide.

## 2019-01-25 ENCOUNTER — Encounter: Payer: Self-pay | Admitting: Nurse Practitioner

## 2019-02-07 ENCOUNTER — Telehealth: Payer: Self-pay | Admitting: Nurse Practitioner

## 2019-02-07 MED ORDER — HYDROCHLOROTHIAZIDE 25 MG PO TABS: 25.0000 mg | ORAL_TABLET | Freq: Every day | ORAL | 3 refills | Status: DC

## 2019-02-07 NOTE — Telephone Encounter (Signed)
Patient notified and verbalized understanding. 

## 2019-02-07 NOTE — Telephone Encounter (Signed)
Routing to provider to send medication to [facility].

## 2019-02-07 NOTE — Telephone Encounter (Signed)
Pt is calling into to follow up on refill request for hydrochlorothiazide (HYDRODIURIL) 25 MG tablet, pt says that medication should have went to [facility] instead, pt says that he has been without his medication for 7 days.   Please assist     Pharmacy: [facility] [place] [place] - [phone]

## 2019-02-08 ENCOUNTER — Other Ambulatory Visit: Payer: Self-pay | Admitting: Nurse Practitioner

## 2019-02-08 ENCOUNTER — Other Ambulatory Visit: Payer: Self-pay

## 2019-02-08 ENCOUNTER — Ambulatory Visit (INDEPENDENT_AMBULATORY_CARE_PROVIDER_SITE_OTHER): Payer: Medicare Other | Admitting: Nurse Practitioner

## 2019-02-08 ENCOUNTER — Encounter: Payer: Self-pay | Admitting: Nurse Practitioner

## 2019-02-08 VITALS — BP 129/72 | HR 73 | Temp 97.9°F | Ht 68.5 in | Wt 267.0 lb

## 2019-02-08 DIAGNOSIS — Z23 Encounter for immunization: Secondary | ICD-10-CM | POA: Diagnosis not present

## 2019-02-08 DIAGNOSIS — Z0000 Encounter for general adult medical examination without abnormal findings: Secondary | ICD-10-CM

## 2019-02-08 DIAGNOSIS — E782 Mixed hyperlipidemia: Secondary | ICD-10-CM

## 2019-02-08 DIAGNOSIS — Z6841 Body Mass Index (BMI) 40.0 and over, adult: Secondary | ICD-10-CM

## 2019-02-08 DIAGNOSIS — G709 Myoneural disorder, unspecified: Secondary | ICD-10-CM | POA: Diagnosis not present

## 2019-02-08 DIAGNOSIS — R351 Nocturia: Secondary | ICD-10-CM

## 2019-02-08 DIAGNOSIS — E559 Vitamin D deficiency, unspecified: Secondary | ICD-10-CM

## 2019-02-08 DIAGNOSIS — N401 Enlarged prostate with lower urinary tract symptoms: Secondary | ICD-10-CM

## 2019-02-08 DIAGNOSIS — I10 Essential (primary) hypertension: Secondary | ICD-10-CM | POA: Diagnosis not present

## 2019-02-08 MED ORDER — HYDROCHLOROTHIAZIDE 25 MG PO TABS: 25.0000 mg | ORAL_TABLET | Freq: Every day | ORAL | 0 refills | Status: DC

## 2019-02-08 NOTE — Assessment & Plan Note (Signed)
Chronic, stable with Flomax.  Continue current regimen and adjust as needed. PSA today. 

## 2019-02-08 NOTE — Progress Notes (Signed)
BP 129/72   Pulse 73   Temp 97.9 F (36.6 C) (Oral)   Ht 5' 8.5" (1.74 m)   Wt 267 lb (121.1 kg)   SpO2 95%   BMI 40.01 kg/m    Subjective:    Patient ID: Ryan Schmidt, male    DOB: 06-30-1955, 64 y.o.   MRN: [id number]  HPI: Ryan Schmidt is a 64 y.o. male presenting on 02/08/2019 for comprehensive medical examination. Current medical complaints include:none  He currently lives with: wife Interim Problems from his last visit: no   HYPERTENSION / HYPERLIPIDEMIA Continues on HCTZ.  Has not taken medication in 9 days, ran out of refills.  Followed by cardiology with recent echo showing EF 50% and moderate mitral valve regurgitation and stress test normal.  Last cardiology visit 01/12/2019.  Has not been to eye doctor in 10 years. Satisfied with current treatment? yes Duration of hypertension: years BP monitoring frequency: three times a week BP range: 120/70 range at home BP medication side effects: no Duration of hyperlipidemia: years Cholesterol medication side effects: no Cholesterol supplements: none Medication compliance: good compliance Aspirin: no Recent stressors: yes Recurrent headaches: no Visual changes: no Palpitations: no Dyspnea: no Chest pain: no Lower extremity edema: no Dizzy/lightheaded: no  The ASCVD Risk score Mikey Bussing DC Jr., et al., 2013) failed to calculate for the following reasons:   The valid total cholesterol range is 130 to 320 mg/dL  BPH Continues on Tamsulosin. BPH status: controlled Satisfied with current treatment?: yes Medication side effects: no Medication compliance: good compliance Duration: years Nocturia: 1/night Urinary frequency:no Incomplete voiding: no Urgency: no Weak urinary stream: no Straining to start stream: no Dysuria: no Severity: mild  NEUROMUSCULAR DISORDER: Chronic, ongoing.  Reports no issues as long as take Baclofen, takes these only as needed (often takes night time dose).  His son's have same muscle  disorder. His cardiologist told him to keep walking, has been walking, but gets 2-3 blocks and then legs start to tremor and lock up some.  He has tried taking Baclofen for his prior to walk and it still happens.  Has seen neurology before and reports they "placed electroshock thing".  Does not wish to return physical therapy and or neurology at this time.     Functional Status Survey: Is the patient deaf or have difficulty hearing?: No Does the patient have difficulty seeing, even when wearing glasses/contacts?: No Does the patient have difficulty concentrating, remembering, or making decisions?: No Does the patient have difficulty walking or climbing stairs?: No Does the patient have difficulty dressing or bathing?: No Does the patient have difficulty doing errands alone such as visiting a doctor's office or shopping?: No  FALL RISK: Fall Risk  02/08/2019 10/20/2018 04/21/2018  Falls in the past year? 1 0 0  Number falls in past yr: 0 0 -  Injury with Fall? 0 0 -  Follow up Falls evaluation completed - Falls evaluation completed    Depression Screen Depression screen [facility] 2/9 02/08/2019 10/20/2018 01/20/2018 12/16/2017  Decreased Interest 0 0 1 0  Down, Depressed, Hopeless 1 0 0 0  PHQ - 2 Score 1 0 1 0  Altered sleeping - - 2 2  Tired, decreased energy - - 1 1  Change in appetite - - 0 0  Feeling bad or failure about yourself  - - 0 0  Trouble concentrating - - 0 0  Moving slowly or fidgety/restless - - 0 0  Suicidal thoughts - - 0 0  PHQ-9 Score - - 4 3  Difficult doing work/chores - - Not difficult at all Not difficult at all    Advanced Directives <no information>  Past Medical History:  Past Medical History:  Diagnosis Date  . Hypercholesteremia   . Hypertension   . Neuromuscular disorder [facility])     Surgical History:  Past Surgical History:  Procedure Laterality Date  . APPENDECTOMY    . LEG SURGERY     at age 58    Medications:  Current Outpatient Medications on  File Prior to Visit  Medication Sig  . atorvastatin (LIPITOR) 40 MG tablet Take 1 tablet (40 mg total) by mouth daily.  . baclofen (LIORESAL) 10 MG tablet Take 1 tablet (10 mg total) by mouth 3 (three) times daily.  . hydrochlorothiazide (HYDRODIURIL) 25 MG tablet Take 1 tablet (25 mg total) by mouth daily.  . tamsulosin (FLOMAX) 0.4 MG CAPS capsule Take 1 capsule (0.4 mg total) by mouth daily after supper.   No current facility-administered medications on file prior to visit.    Allergies:  Allergies  Allergen Reactions  . Ampicillin   . Morphine And Related   . Penicillins Other (See Comments)    jittery  . Valium [Diazepam] Other (See Comments)    Nervous, jittery    Social History:  Social History   Socioeconomic History  . Marital status: Married    Spouse name: Not on file  . Number of children: Not on file  . Years of education: Not on file  . Highest education level: Not on file  Occupational History  . Not on file  Tobacco Use  . Smoking status: Former Research scientist (life sciences)  . Smokeless tobacco: Never Used  . Tobacco comment: quit 30 years ago   Substance and Sexual Activity  . Alcohol use: Not Currently  . Drug use: Not Currently  . Sexual activity: Yes  Other Topics Concern  . Not on file  Social History Narrative  . Not on file   Social Determinants of Health   Financial Resource Strain:   . Difficulty of Paying Living Expenses: Not on file  Food Insecurity:   . Worried About Charity fundraiser in the Last Year: Not on file  . Ran Out of Food in the Last Year: Not on file  Transportation Needs:   . Lack of Transportation (Medical): Not on file  . Lack of Transportation (Non-Medical): Not on file  Physical Activity:   . Days of Exercise per Week: Not on file  . Minutes of Exercise per Session: Not on file  Stress:   . Feeling of Stress : Not on file  Social Connections:   . Frequency of Communication with Friends and Family: Not on file  . Frequency of  Social Gatherings with Friends and Family: Not on file  . Attends Religious Services: Not on file  . Active Member of Clubs or Organizations: Not on file  . Attends Archivist Meetings: Not on file  . Marital Status: Not on file  Intimate Partner Violence:   . Fear of Current or Ex-Partner: Not on file  . Emotionally Abused: Not on file  . Physically Abused: Not on file  . Sexually Abused: Not on file   Social History   Tobacco Use  Smoking Status Former Smoker  Smokeless Tobacco Never Used  Tobacco Comment   quit 30 years ago    Social History   Substance and Sexual Activity  Alcohol Use Not Currently  Family History:  Family History  Problem Relation Age of Onset  . Cancer Mother     Past medical history, surgical history, medications, allergies, family history and social history reviewed with patient today and changes made to appropriate areas of the chart.   Review of Systems - negative All other ROS negative except what is listed above and in the HPI.      Objective:    BP 129/72   Pulse 73   Temp 97.9 F (36.6 C) (Oral)   Ht 5' 8.5" (1.74 m)   Wt 267 lb (121.1 kg)   SpO2 95%   BMI 40.01 kg/m   Wt Readings from Last 3 Encounters:  02/08/19 267 lb (121.1 kg)  11/05/18 261 lb (118.4 kg)  09/06/18 260 lb (117.9 kg)    Physical Exam Vitals and nursing note reviewed.  Constitutional:      General: He is awake. He is not in acute distress.    Appearance: He is well-developed. He is obese. He is not ill-appearing.  HENT:     Head: Normocephalic and atraumatic.     Right Ear: Hearing, tympanic membrane, ear canal and external ear normal. No drainage.     Left Ear: Hearing, tympanic membrane, ear canal and external ear normal. No drainage.     Nose: Nose normal.     Mouth/Throat:     Pharynx: Uvula midline.  Eyes:     General: Lids are normal.        Right eye: No discharge.        Left eye: No discharge.     Extraocular Movements:  Extraocular movements intact.     Conjunctiva/sclera: Conjunctivae normal.     Pupils: Pupils are equal, round, and reactive to light.     Visual Fields: Right eye visual fields normal and left eye visual fields normal.  Neck:     Thyroid: No thyromegaly.     Vascular: No carotid bruit or JVD.     Trachea: Trachea normal.  Cardiovascular:     Rate and Rhythm: Normal rate and regular rhythm.     Heart sounds: Normal heart sounds, S1 normal and S2 normal. No murmur. No gallop.   Pulmonary:     Effort: Pulmonary effort is normal. No accessory muscle usage or respiratory distress.     Breath sounds: Normal breath sounds.  Abdominal:     General: Bowel sounds are normal.     Palpations: Abdomen is soft. There is no hepatomegaly or splenomegaly.     Tenderness: There is no abdominal tenderness.  Genitourinary:    Comments: Deferred per patient request. Musculoskeletal:        General: Normal range of motion.     Cervical back: Normal range of motion and neck supple.     Right lower leg: No edema.     Left lower leg: No edema.  Lymphadenopathy:     Head:     Right side of head: No submental, submandibular, tonsillar, preauricular or posterior auricular adenopathy.     Left side of head: No submental, submandibular, tonsillar, preauricular or posterior auricular adenopathy.     Cervical: No cervical adenopathy.  Skin:    General: Skin is warm and dry.     Capillary Refill: Capillary refill takes less than 2 seconds.     Findings: No rash.  Neurological:     Mental Status: He is alert and oriented to person, place, and time.     Cranial Nerves: Cranial nerves  are intact.     Gait: Gait is intact.     Deep Tendon Reflexes: Reflexes are normal and symmetric.     Reflex Scores:      Brachioradialis reflexes are 2+ on the right side and 2+ on the left side.      Patellar reflexes are 2+ on the right side and 2+ on the left side. Psychiatric:        Attention and Perception: Attention  normal.        Mood and Affect: Mood normal.        Speech: Speech normal.        Behavior: Behavior normal. Behavior is cooperative.        Thought Content: Thought content normal.        Cognition and Memory: Cognition normal.        Judgment: Judgment normal.     Results for orders placed or performed in visit on 11/05/18  Testosterone, Free, Total, SHBG  Result Value Ref Range   Testosterone 472 264 - 916 ng/dL   Testosterone, Free 8.8 6.6 - 18.1 pg/mL   Sex Hormone Binding 75.1 19.3 - 76.4 nmol/L  Comp Met (CMET)  Result Value Ref Range   Glucose 151 (H) 65 - 99 mg/dL   BUN 18 8 - 27 mg/dL   Creatinine, Ser 1.00 0.76 - 1.27 mg/dL   GFR calc non Af Amer 79 >59 mL/min/1.73   GFR calc Af Amer 92 >59 mL/min/1.73   BUN/Creatinine Ratio 18 10 - 24   Sodium 138 134 - 144 mmol/L   Potassium 4.1 3.5 - 5.2 mmol/L   Chloride 100 96 - 106 mmol/L   CO2 22 20 - 29 mmol/L   Calcium 9.4 8.6 - 10.2 mg/dL   Total Protein 7.6 6.0 - 8.5 g/dL   Albumin 4.6 3.8 - 4.8 g/dL   Globulin, Total 3.0 1.5 - 4.5 g/dL   Albumin/Globulin Ratio 1.5 1.2 - 2.2   Bilirubin Total 0.6 0.0 - 1.2 mg/dL   Alkaline Phosphatase 110 39 - 117 IU/L   AST 33 0 - 40 IU/L   ALT 38 0 - 44 IU/L      Assessment & Plan:   Problem List Items Addressed This Visit      Cardiovascular and Mediastinum   Essential hypertension    Chronic, with stable BP readings at home and in office. Continue current medication regimen and adjust as needed.  DASH diet recommended at home.  Continue to collaborate with Dr. Ubaldo Glassing, cardiology.  Return in 6 months.      Relevant Orders   CBC with Differential/Platelet   Comprehensive metabolic panel   TSH     Nervous and Auditory   Neuromuscular disorder (HCC)    Chronic, ongoing.  Have not obtained records from [place] on this.   Continue Baclofen, recommend he take this as ordered to prevent issues while walking.  He declines referral to neurology or PT at this time.  Thyroid screen  today.        Genitourinary   BPH (benign prostatic hyperplasia)    Chronic, stable with Flomax.  Continue current regimen and adjust as needed.  PSA today.      Relevant Orders   PSA     Other   Obesity    Recommend continued focus on healthy diet choices and regular physical activity (30 minutes 5 days a week).  Focus on small goals at a time.  Continue diet focus and daily walks.  Mixed hyperlipidemia    Chronic, stable.  Continue current medication regimen and adjust as needed.  Return in 6 months.      Relevant Orders   Comprehensive metabolic panel   Lipid Panel w/o Chol/HDL Ratio    Other Visit Diagnoses    Annual physical exam    -  Primary   Check annual labs to include TSH, Lipid, CMP, CBC, PSA   Vitamin D deficiency       Reports history of low level, will check this today and start supplement as needed.   Relevant Orders   VITAMIN D 25 Hydroxy (Vit-D Deficiency, Fractures)       Discussed aspirin prophylaxis for myocardial infarction prevention and decision was it was not indicated  LABORATORY TESTING:  Health maintenance labs ordered today as discussed above.   The natural history of prostate cancer and ongoing controversy regarding screening and potential treatment outcomes of prostate cancer has been discussed with the patient. The meaning of a false positive PSA and a false negative PSA has been discussed. He indicates understanding of the limitations of this screening test and wishes to proceed with screening PSA testing.   IMMUNIZATIONS:   - Tdap: Tetanus vaccination status reviewed: provided today - Influenza: Up to date - Pneumovax: Not applicable - Prevnar: Not applicable - Zostavax vaccine: Refused  SCREENING: - Colonoscopy: Up to date  Discussed with patient purpose of the colonoscopy is to detect colon cancer at curable precancerous or early stages   - AAA Screening: Not applicable  -Hearing Test: Not applicable  -Spirometry: Not  applicable   PATIENT COUNSELING:    Sexuality: Discussed sexually transmitted diseases, partner selection, use of condoms, avoidance of unintended pregnancy  and contraceptive alternatives.   Advised to avoid cigarette smoking.  I discussed with the patient that most people either abstain from alcohol or drink within safe limits (<=14/week and <=4 drinks/occasion for males, <=7/weeks and <= 3 drinks/occasion for females) and that the risk for alcohol disorders and other health effects rises proportionally with the number of drinks per week and how often a drinker exceeds daily limits.  Discussed cessation/primary prevention of drug use and availability of treatment for abuse.   Diet: Encouraged to adjust caloric intake to maintain  or achieve ideal body weight, to reduce intake of dietary saturated fat and total fat, to limit sodium intake by avoiding high sodium foods and not adding table salt, and to maintain adequate dietary potassium and calcium preferably from fresh fruits, vegetables, and low-fat dairy products.    stressed the importance of regular exercise  Injury prevention: Discussed safety belts, safety helmets, smoke detector, smoking near bedding or upholstery.   Dental health: Discussed importance of regular tooth brushing, flossing, and dental visits.   Follow up plan: NEXT PREVENTATIVE PHYSICAL DUE IN 1 YEAR. Return in about 6 months (around 08/08/2019) for HTN/HLD.

## 2019-02-08 NOTE — Patient Instructions (Signed)
DASH Eating Plan DASH stands for "Dietary Approaches to Stop Hypertension." The DASH eating plan is a healthy eating plan that has been shown to reduce high blood pressure (hypertension). It may also reduce your risk for type 2 diabetes, heart disease, and stroke. The DASH eating plan may also help with weight loss. What are tips for following this plan?  General guidelines  Avoid eating more than 2,300 mg (milligrams) of salt (sodium) a day. If you have hypertension, you may need to reduce your sodium intake to 1,500 mg a day.  Limit alcohol intake to no more than 1 drink a day for nonpregnant women and 2 drinks a day for men. One drink equals 12 oz of beer, 5 oz of wine, or 1 oz of hard liquor.  Work with your health care provider to maintain a healthy body weight or to lose weight. Ask what an ideal weight is for you.  Get at least 30 minutes of exercise that causes your heart to beat faster (aerobic exercise) most days of the week. Activities may include walking, swimming, or biking.  Work with your health care provider or diet and nutrition specialist (dietitian) to adjust your eating plan to your individual calorie needs. Reading food labels   Check food labels for the amount of sodium per serving. Choose foods with less than 5 percent of the Daily Value of sodium. Generally, foods with less than 300 mg of sodium per serving fit into this eating plan.  To find whole grains, look for the word "whole" as the first word in the ingredient list. Shopping  Buy products labeled as "low-sodium" or "no salt added."  Buy fresh foods. Avoid canned foods and premade or frozen meals. Cooking  Avoid adding salt when cooking. Use salt-free seasonings or herbs instead of table salt or sea salt. Check with your health care provider or pharmacist before using salt substitutes.  Do not fry foods. Cook foods using healthy methods such as baking, boiling, grilling, and broiling instead.  Cook with  heart-healthy oils, such as olive, canola, soybean, or sunflower oil. Meal planning  Eat a balanced diet that includes: ? 5 or more servings of fruits and vegetables each day. At each meal, try to fill half of your plate with fruits and vegetables. ? Up to 6-8 servings of whole grains each day. ? Less than 6 oz of lean meat, poultry, or fish each day. A 3-oz serving of meat is about the same size as a deck of cards. One egg equals 1 oz. ? 2 servings of low-fat dairy each day. ? A serving of nuts, seeds, or beans 5 times each week. ? Heart-healthy fats. Healthy fats called Omega-3 fatty acids are found in foods such as flaxseeds and coldwater fish, like sardines, salmon, and mackerel.  Limit how much you eat of the following: ? Canned or prepackaged foods. ? Food that is high in trans fat, such as fried foods. ? Food that is high in saturated fat, such as fatty meat. ? Sweets, desserts, sugary drinks, and other foods with added sugar. ? Full-fat dairy products.  Do not salt foods before eating.  Try to eat at least 2 vegetarian meals each week.  Eat more home-cooked food and less restaurant, buffet, and fast food.  When eating at a restaurant, ask that your food be prepared with less salt or no salt, if possible. What foods are recommended? The items listed may not be a complete list. Talk with your dietitian about   what dietary choices are best for you. Grains Whole-grain or whole-wheat bread. Whole-grain or whole-wheat pasta. Tavano rice. Oatmeal. Quinoa. Bulgur. Whole-grain and low-sodium cereals. Pita bread. Low-fat, low-sodium crackers. Whole-wheat flour tortillas. Vegetables Fresh or frozen vegetables (raw, steamed, roasted, or grilled). Low-sodium or reduced-sodium tomato and vegetable juice. Low-sodium or reduced-sodium tomato sauce and tomato paste. Low-sodium or reduced-sodium canned vegetables. Fruits All fresh, dried, or frozen fruit. Canned fruit in natural juice (without  added sugar). Meat and other protein foods Skinless chicken or turkey. Ground chicken or turkey. Pork with fat trimmed off. Fish and seafood. Egg whites. Dried beans, peas, or lentils. Unsalted nuts, nut butters, and seeds. Unsalted canned beans. Lean cuts of beef with fat trimmed off. Low-sodium, lean deli meat. Dairy Low-fat (1%) or fat-free (skim) milk. Fat-free, low-fat, or reduced-fat cheeses. Nonfat, low-sodium ricotta or cottage cheese. Low-fat or nonfat yogurt. Low-fat, low-sodium cheese. Fats and oils Soft margarine without trans fats. Vegetable oil. Low-fat, reduced-fat, or light mayonnaise and salad dressings (reduced-sodium). Canola, safflower, olive, soybean, and sunflower oils. Avocado. Seasoning and other foods Herbs. Spices. Seasoning mixes without salt. Unsalted popcorn and pretzels. Fat-free sweets. What foods are not recommended? The items listed may not be a complete list. Talk with your dietitian about what dietary choices are best for you. Grains Baked goods made with fat, such as croissants, muffins, or some breads. Dry pasta or rice meal packs. Vegetables Creamed or fried vegetables. Vegetables in a cheese sauce. Regular canned vegetables (not low-sodium or reduced-sodium). Regular canned tomato sauce and paste (not low-sodium or reduced-sodium). Regular tomato and vegetable juice (not low-sodium or reduced-sodium). Pickles. Olives. Fruits Canned fruit in a light or heavy syrup. Fried fruit. Fruit in cream or butter sauce. Meat and other protein foods Fatty cuts of meat. Ribs. Fried meat. Bacon. Sausage. Bologna and other processed lunch meats. Salami. Fatback. Hotdogs. Bratwurst. Salted nuts and seeds. Canned beans with added salt. Canned or smoked fish. Whole eggs or egg yolks. Chicken or turkey with skin. Dairy Whole or 2% milk, cream, and half-and-half. Whole or full-fat cream cheese. Whole-fat or sweetened yogurt. Full-fat cheese. Nondairy creamers. Whipped toppings.  Processed cheese and cheese spreads. Fats and oils Butter. Stick margarine. Lard. Shortening. Ghee. Bacon fat. Tropical oils, such as coconut, palm kernel, or palm oil. Seasoning and other foods Salted popcorn and pretzels. Onion salt, garlic salt, seasoned salt, table salt, and sea salt. Worcestershire sauce. Tartar sauce. Barbecue sauce. Teriyaki sauce. Soy sauce, including reduced-sodium. Steak sauce. Canned and packaged gravies. Fish sauce. Oyster sauce. Cocktail sauce. Horseradish that you find on the shelf. Ketchup. Mustard. Meat flavorings and tenderizers. Bouillon cubes. Hot sauce and Tabasco sauce. Premade or packaged marinades. Premade or packaged taco seasonings. Relishes. Regular salad dressings. Where to find more information:  National Heart, Lung, and Blood Institute: www.nhlbi.nih.gov  American Heart Association: www.heart.org Summary  The DASH eating plan is a healthy eating plan that has been shown to reduce high blood pressure (hypertension). It may also reduce your risk for type 2 diabetes, heart disease, and stroke.  With the DASH eating plan, you should limit salt (sodium) intake to 2,300 mg a day. If you have hypertension, you may need to reduce your sodium intake to 1,500 mg a day.  When on the DASH eating plan, aim to eat more fresh fruits and vegetables, whole grains, lean proteins, low-fat dairy, and heart-healthy fats.  Work with your health care provider or diet and nutrition specialist (dietitian) to adjust your eating plan to your   individual calorie needs. This information is not intended to replace advice given to you by your health care provider. Make sure you discuss any questions you have with your health care provider. Document Revised: 12/05/2016 Document Reviewed: 12/17/2015 Elsevier Patient Education  2020 Elsevier Inc.  

## 2019-02-08 NOTE — Assessment & Plan Note (Signed)
Chronic, stable.  Continue current medication regimen and adjust as needed.  Return in 6 months. 

## 2019-02-08 NOTE — Telephone Encounter (Signed)
Pt stated that he need a few pills sent to [facility]  On [address] .  He is completely out and can not wait for mail order  Please advise  Best number  [phone]

## 2019-02-08 NOTE — Telephone Encounter (Signed)
Sent 20 tablet to [facility].

## 2019-02-08 NOTE — Assessment & Plan Note (Signed)
Recommend continued focus on healthy diet choices and regular physical activity (30 minutes 5 days a week).  Focus on small goals at a time.  Continue diet focus and daily walks.

## 2019-02-08 NOTE — Telephone Encounter (Signed)
Routing to provider for a short supply to be sent to local pharmacy.

## 2019-02-08 NOTE — Assessment & Plan Note (Signed)
Chronic, ongoing.  Have not obtained records from [place] on this.   Continue Baclofen, recommend he take this as ordered to prevent issues while walking.  He declines referral to neurology or PT at this time.  Thyroid screen today.

## 2019-02-08 NOTE — Telephone Encounter (Signed)
Patient notified and verbalized understanding. 

## 2019-02-08 NOTE — Assessment & Plan Note (Signed)
Chronic, with stable BP readings at home and in office. Continue current medication regimen and adjust as needed.  DASH diet recommended at home.  Continue to collaborate with Dr. Lady Gary, cardiology.  Return in 6 months.

## 2019-02-09 ENCOUNTER — Other Ambulatory Visit: Payer: Self-pay | Admitting: Nurse Practitioner

## 2019-02-09 DIAGNOSIS — E559 Vitamin D deficiency, unspecified: Secondary | ICD-10-CM

## 2019-02-09 LAB — COMPREHENSIVE METABOLIC PANEL
ALT: 40 IU/L (ref 0–44)
AST: 29 IU/L (ref 0–40)
Albumin/Globulin Ratio: 1.5 (ref 1.2–2.2)
Albumin: 4 g/dL (ref 3.8–4.8)
Alkaline Phosphatase: 96 IU/L (ref 39–117)
BUN/Creatinine Ratio: 14 (ref 10–24)
BUN: 13 mg/dL (ref 8–27)
Bilirubin Total: 0.6 mg/dL (ref 0.0–1.2)
CO2: 23 mmol/L (ref 20–29)
Calcium: 8.4 mg/dL — ABNORMAL LOW (ref 8.6–10.2)
Chloride: 107 mmol/L — ABNORMAL HIGH (ref 96–106)
Creatinine, Ser: 0.95 mg/dL (ref 0.76–1.27)
GFR calc Af Amer: 97 mL/min/{1.73_m2} (ref >59)
GFR calc non Af Amer: 84 mL/min/{1.73_m2} (ref >59)
Globulin, Total: 2.6 g/dL (ref 1.5–4.5)
Glucose: 138 mg/dL — ABNORMAL HIGH (ref 65–99)
Potassium: 4.6 mmol/L (ref 3.5–5.2)
Sodium: 142 mmol/L (ref 134–144)
Total Protein: 6.6 g/dL (ref 6.0–8.5)

## 2019-02-09 LAB — CBC WITH DIFFERENTIAL/PLATELET
Basophils Absolute: 0 10*3/uL (ref 0.0–0.2)
Basos: 1 %
EOS (ABSOLUTE): 0.2 10*3/uL (ref 0.0–0.4)
Eos: 3 %
Hematocrit: 38.1 % (ref 37.5–51.0)
Hemoglobin: 13.2 g/dL (ref 13.0–17.7)
Immature Grans (Abs): 0 10*3/uL (ref 0.0–0.1)
Immature Granulocytes: 0 %
Lymphocytes Absolute: 2.5 10*3/uL (ref 0.7–3.1)
Lymphs: 50 %
MCH: 32.7 pg (ref 26.6–33.0)
MCHC: 34.6 g/dL (ref 31.5–35.7)
MCV: 94 fL (ref 79–97)
Monocytes Absolute: 0.4 10*3/uL (ref 0.1–0.9)
Monocytes: 8 %
Neutrophils Absolute: 2 10*3/uL (ref 1.4–7.0)
Neutrophils: 38 %
Platelets: 204 10*3/uL (ref 150–450)
RBC: 4.04 x10E6/uL — ABNORMAL LOW (ref 4.14–5.80)
RDW: 12.3 % (ref 11.6–15.4)
WBC: 5.1 10*3/uL (ref 3.4–10.8)

## 2019-02-09 LAB — TSH: TSH: 2.11 u[IU]/mL (ref 0.450–4.500)

## 2019-02-09 LAB — LIPID PANEL W/O CHOL/HDL RATIO
Cholesterol, Total: 111 mg/dL (ref 100–199)
HDL: 35 mg/dL — ABNORMAL LOW (ref >39)
LDL Chol Calc (NIH): 59 mg/dL (ref 0–99)
Triglycerides: 83 mg/dL (ref 0–149)
VLDL Cholesterol Cal: 17 mg/dL (ref 5–40)

## 2019-02-09 LAB — PSA: Prostate Specific Ag, Serum: 0.7 ng/mL (ref 0.0–4.0)

## 2019-02-09 LAB — VITAMIN D 25 HYDROXY (VIT D DEFICIENCY, FRACTURES): Vit D, 25-Hydroxy: 9.9 ng/mL — ABNORMAL LOW (ref 30.0–100.0)

## 2019-02-09 MED ORDER — CHOLECALCIFEROL 1.25 MG (50000 UT) PO TABS: 1.0000 | ORAL_TABLET | ORAL | 0 refills | Status: DC

## 2019-02-09 NOTE — Progress Notes (Signed)
Good afternoon.  Please let Ryan Schmidt know that overall labs look good, including kidney, liver, thyroid, and prostate results.  Cholesterol levels are at goal, continue Atorvastatin.  His Vitamin D level is really low though at 9.9, goal is > 30.  I am going to send in a higher dose of Vitamin D3 I want him to take weekly for 8 weeks.  Then at 8 weeks I need him to schedule lab visit only and we will recheck this level to determine whether we need to continue weekly dose or can go to daily lower dose.  Vitamin D is good for bone and overall health.  If any questions let me know.  Have a great day.

## 2019-02-15 NOTE — Progress Notes (Signed)
Letter sent.

## 2019-03-18 ENCOUNTER — Encounter: Payer: Self-pay | Admitting: Nurse Practitioner

## 2019-03-25 ENCOUNTER — Ambulatory Visit: Payer: Medicare Other | Attending: Internal Medicine

## 2019-03-25 DIAGNOSIS — Z23 Encounter for immunization: Secondary | ICD-10-CM

## 2019-03-25 NOTE — Progress Notes (Signed)
   Covid-19 Vaccination Clinic  Name:  Ryan Schmidt    MRN: [id number] DOB: 1955/09/29  03/25/2019  [name] was observed post Covid-19 immunization for 30 minutes based on pre-vaccination screening without incident. He was provided with Vaccine Information Sheet and instruction to access the V-Safe system.   [name] was instructed to call 911 with any severe reactions post vaccine: Marland Kitchen Difficulty breathing  . Swelling of face and throat  . A fast heartbeat  . A bad rash all over body  . Dizziness and weakness   Immunizations Administered    Name Date Dose VIS Date Route   Pfizer COVID-19 Vaccine 03/25/2019  5:57 PM 0.3 mL 12/17/2018 Intramuscular   Manufacturer: [employer], [employer]   Lot: [id number]   NDC: [address]-9

## 2019-04-15 ENCOUNTER — Ambulatory Visit: Payer: Medicare HMO

## 2019-04-15 ENCOUNTER — Ambulatory Visit: Payer: Medicare Other | Attending: Internal Medicine

## 2019-04-15 DIAGNOSIS — Z23 Encounter for immunization: Secondary | ICD-10-CM

## 2019-04-15 NOTE — Progress Notes (Signed)
   Covid-19 Vaccination Clinic  Name:  Ryan Schmidt    MRN: [id number] DOB: July 03, 1955  04/15/2019  Ryan Schmidt was observed post Covid-19 immunization for 15 minutes without incident. He was provided with Vaccine Information Sheet and instruction to access the V-Safe system.   Ryan Schmidt was instructed to call 911 with any severe reactions post vaccine: Marland Kitchen Difficulty breathing  . Swelling of face and throat  . A fast heartbeat  . A bad rash all over body  . Dizziness and weakness   Immunizations Administered    Name Date Dose VIS Date Route   Pfizer COVID-19 Vaccine 04/15/2019  4:12 PM 0.3 mL 12/17/2018 Intramuscular   Manufacturer: [employer], [employer]   Lot: [id number]   NDC: [address]-2

## 2019-04-22 ENCOUNTER — Ambulatory Visit: Payer: Medicare Other

## 2019-08-08 ENCOUNTER — Ambulatory Visit: Payer: Medicare Other | Admitting: Nurse Practitioner

## 2019-08-12 ENCOUNTER — Ambulatory Visit: Payer: Medicare Other | Admitting: Nurse Practitioner

## 2019-08-15 DIAGNOSIS — I340 Nonrheumatic mitral (valve) insufficiency: Secondary | ICD-10-CM | POA: Insufficient documentation

## 2019-08-15 DIAGNOSIS — E782 Mixed hyperlipidemia: Secondary | ICD-10-CM | POA: Diagnosis not present

## 2019-08-15 DIAGNOSIS — I10 Essential (primary) hypertension: Secondary | ICD-10-CM | POA: Diagnosis not present

## 2019-10-21 ENCOUNTER — Other Ambulatory Visit: Payer: Self-pay | Admitting: Nurse Practitioner

## 2019-10-21 ENCOUNTER — Ambulatory Visit (INDEPENDENT_AMBULATORY_CARE_PROVIDER_SITE_OTHER): Payer: Medicare Other

## 2019-10-21 VITALS — Ht 69.0 in | Wt 256.0 lb

## 2019-10-21 DIAGNOSIS — Z0000 Encounter for general adult medical examination without abnormal findings: Secondary | ICD-10-CM

## 2019-10-21 NOTE — Progress Notes (Signed)
I connected with Ryan Schmidt today by telephone and verified that I am speaking with the correct person using two identifiers. Location patient: home Location provider: work Persons participating in the virtual visit: Ryan Schmidt, Elisha Ponder LPN.   I discussed the limitations, risks, security and privacy concerns of performing an evaluation and management service by telephone and the availability of in person appointments. I also discussed with the patient that there may be a patient responsible charge related to this service. The patient expressed understanding and verbally consented to this telephonic visit.    Interactive audio and video telecommunications were attempted between this provider and patient, however failed, due to patient having technical difficulties OR patient did not have access to video capability.  We continued and completed visit with audio only.     Vital signs may be patient reported or missing.  Subjective:   Ryan Schmidt is a 64 y.o. male who presents for Medicare Annual/Subsequent preventive examination.  Review of Systems     Cardiac Risk Factors include: advanced age (>4men, >9 women);dyslipidemia;hypertension;male gender;obesity (BMI >30kg/m2);sedentary lifestyle     Objective:    Today's Vitals   10/21/19 1426 10/21/19 1427  Weight: 256 lb (116.1 kg)   Height: 5\' 9"  (1.753 m)   PainSc:  6    Body mass index is 37.8 kg/m.  Advanced Directives 10/21/2019 10/20/2018 09/06/2018  Does Patient Have a Medical Advance Directive? No No No  Would patient like information on creating a medical advance directive? - Yes (MAU/Ambulatory/Procedural Areas - Information given) -    Current Medications (verified) Outpatient Encounter Medications as of 10/21/2019  Medication Sig  . atorvastatin (LIPITOR) 40 MG tablet Take 1 tablet (40 mg total) by mouth daily.  . baclofen (LIORESAL) 10 MG tablet Take 1 tablet (10 mg total) by mouth 3 (three) times daily.    . hydrochlorothiazide (HYDRODIURIL) 25 MG tablet Take 1 tablet (25 mg total) by mouth daily.  . tamsulosin (FLOMAX) 0.4 MG CAPS capsule Take 1 capsule (0.4 mg total) by mouth daily after supper.  . Cholecalciferol 1.25 MG (50000 UT) TABS Take 1 tablet by mouth once a week. For 8 weeks and then stop.  Return to office for lab draw. (Patient not taking: Reported on 10/21/2019)   No facility-administered encounter medications on file as of 10/21/2019.    Allergies (verified) Ampicillin, Morphine and related, Penicillins, and Valium [diazepam]   History: Past Medical History:  Diagnosis Date  . Hypercholesteremia   . Hypertension   . Neuromuscular disorder [facility])    Past Surgical History:  Procedure Laterality Date  . APPENDECTOMY    . LEG SURGERY     at age 2   Family History  Problem Relation Age of Onset  . Cancer Mother    Social History   Socioeconomic History  . Marital status: Married    Spouse name: Not on file  . Number of children: Not on file  . Years of education: Not on file  . Highest education level: Not on file  Occupational History  . Occupation: disability  Tobacco Use  . Smoking status: Former 17  . Smokeless tobacco: Never Used  . Tobacco comment: quit 30 years ago   Vaping Use  . Vaping Use: Never used  Substance and Sexual Activity  . Alcohol use: Not Currently  . Drug use: Not Currently  . Sexual activity: Yes  Other Topics Concern  . Not on file  Social History Narrative  . Not on file  Social Determinants of Health   Financial Resource Strain: Low Risk   . Difficulty of Paying Living Expenses: Not hard at all  Food Insecurity: No Food Insecurity  . Worried About Programme researcher, broadcasting/film/videounning Out of Food in the Last Year: Never true  . Ran Out of Food in the Last Year: Never true  Transportation Needs: No Transportation Needs  . Lack of Transportation (Medical): No  . Lack of Transportation (Non-Medical): No  Physical Activity: Inactive  . Days of  Exercise per Week: 0 days  . Minutes of Exercise per Session: 0 min  Stress: No Stress Concern Present  . Feeling of Stress : Only a little  Social Connections:   . Frequency of Communication with Friends and Family: Not on file  . Frequency of Social Gatherings with Friends and Family: Not on file  . Attends Religious Services: Not on file  . Active Member of Clubs or Organizations: Not on file  . Attends BankerClub or Organization Meetings: Not on file  . Marital Status: Not on file    Tobacco Counseling Counseling given: Not Answered Comment: quit 30 years ago    Clinical Intake:  Pre-visit preparation completed: Yes  Pain : 0-10 Pain Score: 6  Pain Type: Acute pain Pain Location: Knee Pain Orientation: Left Pain Descriptors / Indicators: Aching Pain Onset: More than a month ago Pain Frequency: Constant Pain Relieving Factors: nothing really helps  Pain Relieving Factors: nothing really helps  Nutritional Status: BMI > 30  Obese Nutritional Risks: None Diabetes: No  How often do you need to have someone help you when you read instructions, pamphlets, or other written materials from your doctor or pharmacy?: 1 - Never What is the last grade level you completed in school?: GED  Diabetic? no  Interpreter Needed?: No  Information entered by :: NAllen LPN   Activities of Daily Living In your present state of health, do you have any difficulty performing the following activities: 10/21/2019 02/08/2019  Hearing? N N  Vision? N N  Difficulty concentrating or making decisions? N N  Walking or climbing stairs? Y N  Comment due to knee -  Dressing or bathing? N N  Doing errands, shopping? N N  Preparing Food and eating ? N -  Using the Toilet? N -  In the past six months, have you accidently leaked urine? N -  Do you have problems with loss of bowel control? N -  Managing your Medications? N -  Managing your Finances? N -  Housekeeping or managing your Housekeeping? N -   Some recent data might be hidden    Patient Care Team: Marjie Skiffannady, Jolene T, NP as PCP - General (Nurse Practitioner)  Indicate any recent Medical Services you may have received from other than [facility] providers in the past year (date may be approximate).     Assessment:   This is a routine wellness examination for Ryan Schmidt.  Hearing/Vision screen  Hearing Screening   125Hz  250Hz  500Hz  1000Hz  2000Hz  3000Hz  4000Hz  6000Hz  8000Hz   Right ear:           Left ear:           Vision Screening Comments: No regular eye exams  Dietary issues and exercise activities discussed: Current Exercise Habits: The patient does not participate in regular exercise at present  Goals    . Patient Stated     10/21/2019, wants knee to get better      Depression Screen [facility] 2[facility] Scores 10/21/2019 02/08/2019 10/20/2018 01/20/2018  12/16/2017  PHQ - 2 Score 0 1 0 1 0  PHQ- 9 Score - - - 4 3    Fall Risk Fall Risk  10/21/2019 02/08/2019 10/20/2018 04/21/2018  Falls in the past year? 1 1 0 0  Comment fell at the store, tripped on the pavement - - -  Number falls in past yr: 0 0 0 -  Injury with Fall? 0 0 0 -  Risk for fall due to : Medication side effect;History of fall(s) - - -  Follow up Falls evaluation completed;Education provided;Falls prevention discussed Falls evaluation completed - Falls evaluation completed    Any stairs in or around the home? No  If so, are there any without handrails? n/a Home free of loose throw rugs in walkways, pet beds, electrical cords, etc? Yes  Adequate lighting in your home to reduce risk of falls? Yes   ASSISTIVE DEVICES UTILIZED TO PREVENT FALLS:  Life alert? No  Use of a cane, walker or w/c? No  Grab bars in the bathroom? No  Shower chair or bench in shower? No  Elevated toilet seat or a handicapped toilet? No   TIMED UP AND GO:  Was the test performed? No .     Cognitive Function:     6CIT Screen 10/21/2019  What Year? 0 points  What month? 0 points  What  time? 0 points  Count back from 20 0 points  Months in reverse 4 points  Repeat phrase 2 points  Total Score 6    Immunizations Immunization History  Administered Date(s) Administered  . Influenza,inj,Quad PF,6+ Mos 01/20/2018, 11/05/2018  . Influenza-Unspecified 01/20/2018  . PFIZER SARS-COV-2 Vaccination 03/25/2019, 04/15/2019  . Tdap 02/08/2019    TDAP status: Up to date Flu Vaccine status: Up to date Pneumococcal vaccine status: Up to date Covid-19 vaccine status: Completed vaccines  Qualifies for Shingles Vaccine? Yes   Zostavax completed No   Shingrix Completed?: No.    Education has been provided regarding the importance of this vaccine. Patient has been advised to call insurance company to determine out of pocket expense if they have not yet received this vaccine. Advised may also receive vaccine at local pharmacy or Health Dept. Verbalized acceptance and understanding.  Screening Tests Health Maintenance  Topic Date Due  . PNA vac Low Risk Adult (1 of 2 - PCV13) Never done  . INFLUENZA VACCINE  08/07/2019  . COLONOSCOPY  04/20/2024  . TETANUS/TDAP  02/07/2029  . COVID-19 Vaccine  Completed  . Hepatitis C Screening  Completed  . HIV Screening  Completed    Health Maintenance  Health Maintenance Due  Topic Date Due  . PNA vac Low Risk Adult (1 of 2 - PCV13) Never done  . INFLUENZA VACCINE  08/07/2019    Colorectal cancer screening: Completed 04/20/2017. Repeat every 7 years  Lung Cancer Screening: (Low Dose CT Chest recommended if Age 13-80 years, 30 pack-year currently smoking OR have quit w/in 15years.) does not qualify.   Lung Cancer Screening Referral: no  Additional Screening:  Hepatitis C Screening: does qualify; Completed 01/20/2018  Vision Screening: Recommended annual ophthalmology exams for early detection of glaucoma and other disorders of the eye. Is the patient up to date with their annual eye exam?  No  Who is the provider or what is the  name of the office in which the patient attends annual eye exams? none If pt is not established with a provider, would they like to be referred to a provider to  establish care? No .   Dental Screening: Recommended annual dental exams for proper oral hygiene  Community Resource Referral / Chronic Care Management: CRR required this visit?  No   CCM required this visit?  No      Plan:     I have personally reviewed and noted the following in the patient's chart:   . Medical and social history . Use of alcohol, tobacco or illicit drugs  . Current medications and supplements . Functional ability and status . Nutritional status . Physical activity . Advanced directives . List of other physicians . Hospitalizations, surgeries, and ER visits in previous 12 months . Vitals . Screenings to include cognitive, depression, and falls . Referrals and appointments  In addition, I have reviewed and discussed with patient certain preventive protocols, quality metrics, and best practice recommendations. A written personalized care plan for preventive services as well as general preventive health recommendations were provided to patient.     Barb Merino, LPN   [id number]   Nurse Notes:

## 2019-10-21 NOTE — Patient Instructions (Signed)
Ryan Schmidt , Thank you for taking time to come for your Medicare Wellness Visit. I appreciate your ongoing commitment to your health goals. Please review the following plan we discussed and let me know if I can assist you in the future.   Screening recommendations/referrals: Colonoscopy: completed 04/20/2017 Recommended yearly ophthalmology/optometry visit for glaucoma screening and checkup Recommended yearly dental visit for hygiene and checkup  Vaccinations: Influenza vaccine: due Pneumococcal vaccine: n/a Tdap vaccine: completed 02/08/2019 Shingles vaccine: discussed   Covid-19:  04/15/2019, 03/25/2019  Advanced directives: Advance directive discussed with you today.   Conditions/risks identified: none  Next appointment: Follow up in one year for your annual wellness visit.   Preventive Care 12 Years and Older, Male Preventive care refers to lifestyle choices and visits with your health care provider that can promote health and wellness. What does preventive care include?  A yearly physical exam. This is also called an annual well check.  Dental exams once or twice a year.  Routine eye exams. Ask your health care provider how often you should have your eyes checked.  Personal lifestyle choices, including:  Daily care of your teeth and gums.  Regular physical activity.  Eating a healthy diet.  Avoiding tobacco and drug use.  Limiting alcohol use.  Practicing safe sex.  Taking low doses of aspirin every day.  Taking vitamin and mineral supplements as recommended by your health care provider. What happens during an annual well check? The services and screenings done by your health care provider during your annual well check will depend on your age, overall health, lifestyle risk factors, and family history of disease. Counseling  Your health care provider may ask you questions about your:  Alcohol use.  Tobacco use.  Drug use.  Emotional well-being.  Home and  relationship well-being.  Sexual activity.  Eating habits.  History of falls.  Memory and ability to understand (cognition).  Work and work Astronomer. Screening  You may have the following tests or measurements:  Height, weight, and BMI.  Blood pressure.  Lipid and cholesterol levels. These may be checked every 5 years, or more frequently if you are over 48 years old.  Skin check.  Lung cancer screening. You may have this screening every year starting at age 16 if you have a 30-pack-year history of smoking and currently smoke or have quit within the past 15 years.  Fecal occult blood test (FOBT) of the stool. You may have this test every year starting at age 5.  Flexible sigmoidoscopy or colonoscopy. You may have a sigmoidoscopy every 5 years or a colonoscopy every 10 years starting at age 62.  Prostate cancer screening. Recommendations will vary depending on your family history and other risks.  Hepatitis C blood test.  Hepatitis B blood test.  Sexually transmitted disease (STD) testing.  Diabetes screening. This is done by checking your blood sugar (glucose) after you have not eaten for a while (fasting). You may have this done every 1-3 years.  Abdominal aortic aneurysm (AAA) screening. You may need this if you are a current or former smoker.  Osteoporosis. You may be screened starting at age 3 if you are at high risk. Talk with your health care provider about your test results, treatment options, and if necessary, the need for more tests. Vaccines  Your health care provider may recommend certain vaccines, such as:  Influenza vaccine. This is recommended every year.  Tetanus, diphtheria, and acellular pertussis (Tdap, Td) vaccine. You may need a Td  booster every 10 years.  Zoster vaccine. You may need this after age 53.  Pneumococcal 13-valent conjugate (PCV13) vaccine. One dose is recommended after age 27.  Pneumococcal polysaccharide (PPSV23) vaccine. One  dose is recommended after age 21. Talk to your health care provider about which screenings and vaccines you need and how often you need them. This information is not intended to replace advice given to you by your health care provider. Make sure you discuss any questions you have with your health care provider. Document Released: 01/19/2015 Document Revised: 09/12/2015 Document Reviewed: 10/24/2014 Elsevier Interactive Patient Education  2017 [place] Prevention in the Home Falls can cause injuries. They can happen to people of all ages. There are many things you can do to make your home safe and to help prevent falls. What can I do on the outside of my home?  Regularly fix the edges of walkways and driveways and fix any cracks.  Remove anything that might make you trip as you walk through a door, such as a raised step or threshold.  Trim any bushes or trees on the path to your home.  Use bright outdoor lighting.  Clear any walking paths of anything that might make someone trip, such as rocks or tools.  Regularly check to see if handrails are loose or broken. Make sure that both sides of any steps have handrails.  Any raised decks and porches should have guardrails on the edges.  Have any leaves, snow, or ice cleared regularly.  Use sand or salt on walking paths during winter.  Clean up any spills in your garage right away. This includes oil or grease spills. What can I do in the bathroom?  Use night lights.  Install grab bars by the toilet and in the tub and shower. Do not use towel bars as grab bars.  Use non-skid mats or decals in the tub or shower.  If you need to sit down in the shower, use a plastic, non-slip stool.  Keep the floor dry. Clean up any water that spills on the floor as soon as it happens.  Remove soap buildup in the tub or shower regularly.  Attach bath mats securely with double-sided non-slip rug tape.  Do not have throw rugs and other  things on the floor that can make you trip. What can I do in the bedroom?  Use night lights.  Make sure that you have a light by your bed that is easy to reach.  Do not use any sheets or blankets that are too big for your bed. They should not hang down onto the floor.  Have a firm chair that has side arms. You can use this for support while you get dressed.  Do not have throw rugs and other things on the floor that can make you trip. What can I do in the kitchen?  Clean up any spills right away.  Avoid walking on wet floors.  Keep items that you use a lot in easy-to-reach places.  If you need to reach something above you, use a strong step stool that has a grab bar.  Keep electrical cords out of the way.  Do not use floor polish or wax that makes floors slippery. If you must use wax, use non-skid floor wax.  Do not have throw rugs and other things on the floor that can make you trip. What can I do with my stairs?  Do not leave any items on the stairs.  Make sure that there are handrails on both sides of the stairs and use them. Fix handrails that are broken or loose. Make sure that handrails are as long as the stairways.  Check any carpeting to make sure that it is firmly attached to the stairs. Fix any carpet that is loose or worn.  Avoid having throw rugs at the top or bottom of the stairs. If you do have throw rugs, attach them to the floor with carpet tape.  Make sure that you have a light switch at the top of the stairs and the bottom of the stairs. If you do not have them, ask someone to add them for you. What else can I do to help prevent falls?  Wear shoes that:  Do not have high heels.  Have rubber bottoms.  Are comfortable and fit you well.  Are closed at the toe. Do not wear sandals.  If you use a stepladder:  Make sure that it is fully opened. Do not climb a closed stepladder.  Make sure that both sides of the stepladder are locked into place.  Ask  someone to hold it for you, if possible.  Clearly mark and make sure that you can see:  Any grab bars or handrails.  First and last steps.  Where the edge of each step is.  Use tools that help you move around (mobility aids) if they are needed. These include:  Canes.  Walkers.  Scooters.  Crutches.  Turn on the lights when you go into a dark area. Replace any light bulbs as soon as they burn out.  Set up your furniture so you have a clear path. Avoid moving your furniture around.  If any of your floors are uneven, fix them.  If there are any pets around you, be aware of where they are.  Review your medicines with your doctor. Some medicines can make you feel dizzy. This can increase your chance of falling. Ask your doctor what other things that you can do to help prevent falls. This information is not intended to replace advice given to you by your health care provider. Make sure you discuss any questions you have with your health care provider. Document Released: 10/19/2008 Document Revised: 05/31/2015 Document Reviewed: 01/27/2014 Elsevier Interactive Patient Education  2017 [employer].

## 2019-10-22 NOTE — Telephone Encounter (Signed)
Requested medications are due for refill today yes  Requested medications are on the active medication list yes  Last refill 8/21  Last visit 2/21  Future visit scheduled 10/2020  Notes to clinic After appt 2/21 OV note states return in 6 months, the upcoming appt is not scheduled until 11/2020 and two appts have been canceled. Please assess

## 2019-12-12 DIAGNOSIS — M25562 Pain in left knee: Secondary | ICD-10-CM | POA: Diagnosis not present

## 2019-12-12 DIAGNOSIS — M1612 Unilateral primary osteoarthritis, left hip: Secondary | ICD-10-CM | POA: Diagnosis not present

## 2019-12-12 DIAGNOSIS — M25552 Pain in left hip: Secondary | ICD-10-CM | POA: Diagnosis not present

## 2019-12-12 DIAGNOSIS — M1732 Unilateral post-traumatic osteoarthritis, left knee: Secondary | ICD-10-CM | POA: Diagnosis not present

## 2019-12-26 ENCOUNTER — Other Ambulatory Visit: Payer: Self-pay | Admitting: Orthopedic Surgery

## 2019-12-26 DIAGNOSIS — M1732 Unilateral post-traumatic osteoarthritis, left knee: Secondary | ICD-10-CM

## 2019-12-26 DIAGNOSIS — G709 Myoneural disorder, unspecified: Secondary | ICD-10-CM | POA: Diagnosis not present

## 2020-01-02 ENCOUNTER — Ambulatory Visit
Admission: RE | Admit: 2020-01-02 | Discharge: 2020-01-02 | Disposition: A | Discharge status: Home / Self Care | Payer: Medicare Other | Source: Ambulatory Visit | Attending: Orthopedic Surgery | Admitting: Orthopedic Surgery

## 2020-01-02 ENCOUNTER — Other Ambulatory Visit: Payer: Self-pay

## 2020-01-02 DIAGNOSIS — Z01818 Encounter for other preprocedural examination: Secondary | ICD-10-CM | POA: Diagnosis not present

## 2020-01-02 DIAGNOSIS — M1732 Unilateral post-traumatic osteoarthritis, left knee: Secondary | ICD-10-CM | POA: Insufficient documentation

## 2020-01-02 DIAGNOSIS — M1712 Unilateral primary osteoarthritis, left knee: Secondary | ICD-10-CM | POA: Diagnosis not present

## 2020-01-02 DIAGNOSIS — G8929 Other chronic pain: Secondary | ICD-10-CM | POA: Diagnosis not present

## 2020-01-02 DIAGNOSIS — M2342 Loose body in knee, left knee: Secondary | ICD-10-CM | POA: Diagnosis not present

## 2020-01-24 DIAGNOSIS — H18413 Arcus senilis, bilateral: Secondary | ICD-10-CM | POA: Diagnosis not present

## 2020-01-24 DIAGNOSIS — H2513 Age-related nuclear cataract, bilateral: Secondary | ICD-10-CM | POA: Diagnosis not present

## 2020-01-24 DIAGNOSIS — H25043 Posterior subcapsular polar age-related cataract, bilateral: Secondary | ICD-10-CM | POA: Diagnosis not present

## 2020-01-24 DIAGNOSIS — H25013 Cortical age-related cataract, bilateral: Secondary | ICD-10-CM | POA: Diagnosis not present

## 2020-01-24 DIAGNOSIS — H2511 Age-related nuclear cataract, right eye: Secondary | ICD-10-CM | POA: Diagnosis not present

## 2020-02-13 DIAGNOSIS — H2511 Age-related nuclear cataract, right eye: Secondary | ICD-10-CM | POA: Diagnosis not present

## 2020-02-14 DIAGNOSIS — H2512 Age-related nuclear cataract, left eye: Secondary | ICD-10-CM | POA: Diagnosis not present

## 2020-02-27 ENCOUNTER — Other Ambulatory Visit: Payer: Self-pay | Admitting: Nurse Practitioner

## 2020-02-27 DIAGNOSIS — I10 Essential (primary) hypertension: Secondary | ICD-10-CM | POA: Diagnosis not present

## 2020-02-27 DIAGNOSIS — I340 Nonrheumatic mitral (valve) insufficiency: Secondary | ICD-10-CM | POA: Diagnosis not present

## 2020-02-27 DIAGNOSIS — E782 Mixed hyperlipidemia: Secondary | ICD-10-CM | POA: Diagnosis not present

## 2020-02-27 DIAGNOSIS — Z23 Encounter for immunization: Secondary | ICD-10-CM | POA: Diagnosis not present

## 2020-03-05 DIAGNOSIS — H2512 Age-related nuclear cataract, left eye: Secondary | ICD-10-CM | POA: Diagnosis not present

## 2020-04-08 ENCOUNTER — Other Ambulatory Visit: Payer: Self-pay | Admitting: Nurse Practitioner

## 2020-04-19 ENCOUNTER — Telehealth: Payer: Self-pay

## 2020-04-19 NOTE — Telephone Encounter (Signed)
Copied from CRM [id number]. Topic: General - Other >> Apr 19, 2020  2:17 PM Tamela Oddi wrote: Reason for CRM: Patient would like the nurse to call him regarding possible poison oak.  He stated he has been using some OTC medication but would like her advice as to whether he should schedule an appt. To come in.  Please advise and call patient to discuss at [phone]

## 2020-04-19 NOTE — Telephone Encounter (Signed)
Patient notified and verbalized understanding. 

## 2020-04-19 NOTE — Telephone Encounter (Signed)
Please let him know if over the counter hydrocortisone is not improving the rash, then I would recommend he be seen.  We are not in office tomorrow, I would recommend he head to urgent care today or tomorrow for area to be assessed if not improving.  If ongoing issues after this then recommend he follow-up with me next week.

## 2020-04-22 ENCOUNTER — Emergency Department
Admission: EM | Admit: 2020-04-22 | Discharge: 2020-04-22 | Disposition: A | Discharge status: Home / Self Care | Payer: Medicare Other | Attending: Emergency Medicine | Admitting: Emergency Medicine

## 2020-04-22 DIAGNOSIS — L237 Allergic contact dermatitis due to plants, except food: Secondary | ICD-10-CM | POA: Insufficient documentation

## 2020-04-22 DIAGNOSIS — I10 Essential (primary) hypertension: Secondary | ICD-10-CM | POA: Insufficient documentation

## 2020-04-22 DIAGNOSIS — Z79899 Other long term (current) drug therapy: Secondary | ICD-10-CM | POA: Insufficient documentation

## 2020-04-22 DIAGNOSIS — R21 Rash and other nonspecific skin eruption: Secondary | ICD-10-CM | POA: Diagnosis present

## 2020-04-22 DIAGNOSIS — Z87891 Personal history of nicotine dependence: Secondary | ICD-10-CM | POA: Insufficient documentation

## 2020-04-22 MED ORDER — PREDNISONE 10 MG PO TABS: ORAL_TABLET | ORAL | 0 refills | Status: DC

## 2020-04-22 MED ORDER — SULFAMETHOXAZOLE-TRIMETHOPRIM 800-160 MG PO TABS: 1.0000 | ORAL_TABLET | Freq: Two times a day (BID) | ORAL | 0 refills | Status: AC

## 2020-04-22 MED ORDER — SULFAMETHOXAZOLE-TRIMETHOPRIM 800-160 MG PO TABS
1.0000 | ORAL_TABLET | Freq: Once | ORAL | Status: AC
  Administered 2020-04-22: 1 via ORAL
  Filled 2020-04-22: qty 1

## 2020-04-22 MED ORDER — HYDROXYZINE HCL 25 MG PO TABS
25.0000 mg | ORAL_TABLET | Freq: Once | ORAL | Status: AC
  Administered 2020-04-22: 25 mg via ORAL
  Filled 2020-04-22: qty 1

## 2020-04-22 MED ORDER — DEXAMETHASONE SODIUM PHOSPHATE 10 MG/ML IJ SOLN
10.0000 mg | Freq: Once | INTRAMUSCULAR | Status: AC
  Administered 2020-04-22: 10 mg via INTRAMUSCULAR
  Filled 2020-04-22: qty 1

## 2020-04-22 MED ORDER — HYDROXYZINE HCL 10 MG PO TABS: 10.0000 mg | ORAL_TABLET | Freq: Three times a day (TID) | ORAL | 0 refills | Status: DC | PRN

## 2020-04-22 NOTE — ED Provider Notes (Signed)
[facility] Emergency Department Provider Note  ____________________________________________   Event Date/Time   First MD Initiated Contact with Patient 04/22/20 2154     (approximate)  I have reviewed the triage vital signs and the nursing notes.   HISTORY  Chief Complaint Allergic Reaction   HPI Ryan Schmidt is a 65 y.o. male who presents to the emergency department for evaluation of rash.  Patient states that approximately 1 week ago, he noticed a few bumps raised up on the left arm just distal to the elbow after he had been working outside.  He assumed that they were mosquito bites, as he was unaware of any specific plants that he was in.  Unfortunately, since that time he has had progression of rash from the left arm, has spread to the entire arm, and a large portion of the left side of his trunk.  Since this began, the left arm rash has now turned into blistering, and has multiple weeping open areas.  The patient has been applying Benadryl calamine lotion and tea tree oil to the area, but reports severe itching, and no improvement with the topicals.  He denies any fevers, shortness of breath, or other severe allergy symptoms.  He denies knowing of any allergies.     Past Medical History:  Diagnosis Date  . Hypercholesteremia   . Hypertension   . Neuromuscular disorder [facility])     Patient Active Problem List   Diagnosis Date Noted  . Vitamin D deficiency 02/09/2019  . Erectile dysfunction 11/05/2018  . Mixed hyperlipidemia 01/20/2018  . Neuromuscular disorder (HCC) 12/16/2017  . Essential hypertension 12/16/2017  . BPH (benign prostatic hyperplasia) 12/16/2017  . Obesity 12/16/2017    Past Surgical History:  Procedure Laterality Date  . APPENDECTOMY    . LEG SURGERY     at age 88    Prior to Admission medications   Medication Sig Start Date End Date Taking? Authorizing Provider  atorvastatin (LIPITOR) 40 MG tablet TAKE 1 TABLET BY MOUTH   DAILY 10/24/19   Cannady, Corrie Dandy T, NP  hydrOXYzine (ATARAX/VISTARIL) 10 MG tablet Take 1 tablet (10 mg total) by mouth 3 (three) times daily as needed. 04/22/20  Yes Lucy Chris, PA  predniSONE (DELTASONE) 10 MG tablet Take 4 tablets by mouth for 5 days, then take 2 tablets by mouth for 5 days, then take 1 tablet by mouth for 10 days for a total of 20 days of treatment. 04/22/20  Yes Lucy Chris, PA  sulfamethoxazole-trimethoprim (BACTRIM DS) 800-160 MG tablet Take 1 tablet by mouth 2 (two) times daily for 10 days. 04/22/20 05/02/20 Yes Hart Haas, Ruben Gottron, PA  baclofen (LIORESAL) 10 MG tablet Take 1 tablet (10 mg total) by mouth 3 (three) times daily. 01/14/19   Cannady, Corrie Dandy T, NP  Cholecalciferol 1.25 MG (50000 UT) TABS Take 1 tablet by mouth once a week. For 8 weeks and then stop.  Return to office for lab draw. Patient not taking: Reported on 10/21/2019 02/09/19   Aura Dials T, NP  hydrochlorothiazide (HYDRODIURIL) 25 MG tablet TAKE 1 TABLET BY MOUTH  DAILY 02/27/20   Cannady, Corrie Dandy T, NP  tamsulosin (FLOMAX) 0.4 MG CAPS capsule TAKE 1 CAPSULE BY MOUTH  DAILY AFTER SUPPER 04/09/20   Cannady, Corrie Dandy T, NP    Allergies Ampicillin, Morphine and related, Penicillins, and Valium [diazepam]  Family History  Problem Relation Age of Onset  . Cancer Mother     Social History Social History  Tobacco Use  . Smoking status: Former Games developer  . Smokeless tobacco: Never Used  . Tobacco comment: quit 30 years ago   Vaping Use  . Vaping Use: Never used  Substance Use Topics  . Alcohol use: Not Currently  . Drug use: Not Currently    Review of Systems Constitutional: No fever/chills Eyes: No visual changes. ENT: No sore throat. Cardiovascular: Denies chest pain. Respiratory: Denies shortness of breath. Gastrointestinal: No abdominal pain.  No nausea, no vomiting.  No diarrhea.  No constipation. Genitourinary: Negative for dysuria. Musculoskeletal: Negative for back pain. Skin:  + Rash Neurological: Negative for headaches, focal weakness or numbness.  ____________________________________________   PHYSICAL EXAM:  VITAL SIGNS: ED Triage Vitals [04/22/20 1955]  Enc Vitals Group     BP (!) 153/97     Pulse Rate 95     Resp 18     Temp 98.4 F (36.9 C)     Temp Source Oral     SpO2 95 %     Weight 260 lb (117.9 kg)     Height 5\' 9"  (1.753 m)     Head Circumference      Peak Flow      Pain Score 8     Pain Loc      Pain Edu?      Excl. in GC?    Constitutional: Alert and oriented. Well appearing and in no acute distress. Eyes: Conjunctivae are normal. PERRL. EOMI. Head: Atraumatic. Nose: No congestion/rhinnorhea. Mouth/Throat: Mucous membranes are moist.  Oropharynx non-erythematous. Neck: No stridor.   Cardiovascular: Normal rate, regular rhythm. Grossly normal heart sounds.  Good peripheral circulation. Respiratory: Normal respiratory effort.  No retractions. Lungs CTAB. Gastrointestinal: Soft and nontender. No distention. No abdominal bruits. No CVA tenderness. Musculoskeletal: No lower extremity tenderness nor edema.  No joint effusions. Neurologic:  Normal speech and language. No gross focal neurologic deficits are appreciated. No gait instability. Skin: There is a large maculopapular rash with superimposed blisters and bullae.  On the left arm, there is erythema extending beyond that of the initial maculopapular area, concerning for superimposed cellulitis.  On the left side of the trunk, there are less blistering, but increased number of urticarial, maculopapular areas.  Smaller, scattered areas noted on the right arm, left leg, but not as significant as the left arm and trunk. Psychiatric: Mood and affect are normal. Speech and behavior are normal.  ____________________________________________   INITIAL IMPRESSION / ASSESSMENT AND PLAN / ED COURSE  As part of my medical decision making, I reviewed the following data within the electronic  MEDICAL RECORD NUMBER Nursing notes reviewed and incorporated and Notes from prior ED visits        Patient is a 66 year old male who presents to the emergency department for eval ration of rash has been present over the last week after working outside, with progression and failure of over-the-counter topical medications.  See HPI for further details.  On physical exam, the patient does have a maculopapular rash with superimposed blisters and bullae.  There is erythema extending beyond the original rash on the left arm, concerning for associated cellulitis.  Patient denies fever or other systemic symptoms.  Suspect that this is poison ivy dermatitis with superimposed cellulitis.  Will initiate treatment with long steroid taper as well as coverage for potential associated superimposed infection.  Return precautions were discussed, and patient has a minimal plan.  Stable this time for outpatient follow-up.      ____________________________________________   FINAL  CLINICAL IMPRESSION(S) / ED DIAGNOSES  Final diagnoses:  Allergic dermatitis due to poison ivy     ED Discharge Orders         Ordered    predniSONE (DELTASONE) 10 MG tablet        04/22/20 2327    hydrOXYzine (ATARAX/VISTARIL) 10 MG tablet  3 times daily PRN        04/22/20 2327    sulfamethoxazole-trimethoprim (BACTRIM DS) 800-160 MG tablet  2 times daily        04/22/20 2327          *Please note:  Ryan Schmidt was evaluated in Emergency Department on 04/23/2020 for the symptoms described in the history of present illness. He was evaluated in the context of the global COVID-19 pandemic, which necessitated consideration that the patient might be at risk for infection with the SARS-CoV-2 virus that causes COVID-19. Institutional protocols and algorithms that pertain to the evaluation of patients at risk for COVID-19 are in a state of rapid change based on information released by regulatory bodies including the CDC and federal and  state organizations. These policies and algorithms were followed during the patient's care in the ED.  Some ED evaluations and interventions may be delayed as a result of limited staffing during and the pandemic.*   Note:  This document was prepared using Dragon voice recognition software and may include unintentional dictation errors.   Lucy Chris, PA 04/23/20 2321    Merwyn Katos, MD 04/27/20 [phone]

## 2020-04-22 NOTE — ED Notes (Signed)
Patient presents to emergency department with chief complaint of allergic reaction. Reports cutting the grass 1.5 weeks ago and noticing 2 small blisters to left upper arm. Reports scratching this site and noticing "rash spreading to left forearm and right upper arm". Multiple open blisters noted to left upper and right upper arm, serous drainage noted to same. Has been applying calamine cream and tree oil to same with no improvement.

## 2020-04-22 NOTE — ED Triage Notes (Signed)
Pt reports working outside about a week ago, states after working he noticed a few small bumps to left arm. Left arm and left side now blistering with blisters opening, states area keeps spreading. Pt reports it is "burning," has been placing benadryl lotion, calamine lotion, and tea tree oil to area. Denies fevers, SOB, DIB, or throat discomfort.

## 2020-04-22 NOTE — Discharge Instructions (Signed)
Please take the prolonged steroid taper as prescribed.   Please also take the antibiotic as prescribed.  You may use the hydroxyzine for itching as needed.  Please follow-up with your primary care at the end of this week to ensure that you are improving.  Return to the ER for any worsening.

## 2020-04-25 ENCOUNTER — Other Ambulatory Visit: Payer: Self-pay

## 2020-04-25 ENCOUNTER — Encounter: Payer: Self-pay | Admitting: Nurse Practitioner

## 2020-04-25 ENCOUNTER — Ambulatory Visit (INDEPENDENT_AMBULATORY_CARE_PROVIDER_SITE_OTHER): Payer: Medicare Other | Admitting: Nurse Practitioner

## 2020-04-25 VITALS — BP 148/87 | HR 79 | Temp 98.0°F

## 2020-04-25 DIAGNOSIS — G709 Myoneural disorder, unspecified: Secondary | ICD-10-CM | POA: Diagnosis not present

## 2020-04-25 DIAGNOSIS — L237 Allergic contact dermatitis due to plants, except food: Secondary | ICD-10-CM | POA: Diagnosis not present

## 2020-04-25 DIAGNOSIS — M1732 Unilateral post-traumatic osteoarthritis, left knee: Secondary | ICD-10-CM | POA: Diagnosis not present

## 2020-04-25 DIAGNOSIS — M1612 Unilateral primary osteoarthritis, left hip: Secondary | ICD-10-CM | POA: Diagnosis not present

## 2020-04-25 NOTE — Progress Notes (Signed)
BP (!) 148/87   Pulse 79   Temp 98 F (36.7 C)   SpO2 99%    Subjective:    Patient ID: Ryan Schmidt, male    DOB: 1955/08/20, 65 y.o.   MRN: [id number]  HPI: Ryan Schmidt is a 65 y.o. male  Chief Complaint  Patient presents with  . Rash    Went to the ER on Sunday, patient states that it is getting a little better but is extremely itchy.   RASH Patient presents to clinic today following an ED visit for [facility].  Patient was working outside and then noticed a few bumps on his arms.  The bumps then turned into a widespread rash on his left arm.  Patient was treated with Steroids, hydroxyzine and bactrim in the ED.  Today patient reports it is extremely itchy.  Patient denies SOB, itchy throat, or difficulty swallowing.    Relevant past medical, surgical, family and social history reviewed and updated as indicated. Interim medical history since our last visit reviewed. Allergies and medications reviewed and updated.  Review of Systems  HENT: Negative for trouble swallowing.        Itchy throat  Respiratory: Negative for shortness of breath.   Skin: Positive for rash.       Rash on left arm and left side of abdomen.    Per HPI unless specifically indicated above     Objective:    BP (!) 148/87   Pulse 79   Temp 98 F (36.7 C)   SpO2 99%   Wt Readings from Last 3 Encounters:  04/22/20 260 lb (117.9 kg)  10/21/19 256 lb (116.1 kg)  02/08/19 267 lb (121.1 kg)    Physical Exam Vitals and nursing note reviewed.  Constitutional:      General: He is not in acute distress.    Appearance: Normal appearance. He is not ill-appearing, toxic-appearing or diaphoretic.  HENT:     Head: Normocephalic.     Right Ear: External ear normal.     Left Ear: External ear normal.     Nose: Nose normal. No congestion or rhinorrhea.     Mouth/Throat:     Mouth: Mucous membranes are moist.  Eyes:     General:        Right eye: No discharge.        Left eye: No discharge.      Extraocular Movements: Extraocular movements intact.     Conjunctiva/sclera: Conjunctivae normal.     Pupils: Pupils are equal, round, and reactive to light.  Cardiovascular:     Rate and Rhythm: Normal rate and regular rhythm.     Heart sounds: No murmur heard.   Pulmonary:     Effort: Pulmonary effort is normal. No respiratory distress.     Breath sounds: Normal breath sounds. No wheezing, rhonchi or rales.  Abdominal:     General: Abdomen is flat. Bowel sounds are normal.  Musculoskeletal:     Cervical back: Normal range of motion and neck supple.  Skin:    General: Skin is warm and dry.     Capillary Refill: Capillary refill takes less than 2 seconds.       Neurological:     General: No focal deficit present.     Mental Status: He is alert and oriented to person, place, and time.  Psychiatric:        Mood and Affect: Mood normal.        Behavior: Behavior normal.  Thought Content: Thought content normal.        Judgment: Judgment normal.     Results for orders placed or performed in visit on 02/08/19  CBC with Differential/Platelet  Result Value Ref Range   WBC 5.1 3.4 - 10.8 x10E3/uL   RBC 4.04 (L) 4.14 - 5.80 x10E6/uL   Hemoglobin 13.2 13.0 - 17.7 g/dL   Hematocrit 49.7 02.6 - 51.0 %   MCV 94 79 - 97 fL   MCH 32.7 26.6 - 33.0 pg   MCHC 34.6 31.5 - 35.7 g/dL   RDW 37.8 58.8 - 50.2 %   Platelets 204 150 - 450 x10E3/uL   Neutrophils 38 Not Estab. %   Lymphs 50 Not Estab. %   Monocytes 8 Not Estab. %   Eos 3 Not Estab. %   Basos 1 Not Estab. %   Neutrophils Absolute 2.0 1.4 - 7.0 x10E3/uL   Lymphocytes Absolute 2.5 0.7 - 3.1 x10E3/uL   Monocytes Absolute 0.4 0.1 - 0.9 x10E3/uL   EOS (ABSOLUTE) 0.2 0.0 - 0.4 x10E3/uL   Basophils Absolute 0.0 0.0 - 0.2 x10E3/uL   Immature Granulocytes 0 Not Estab. %   Immature Grans (Abs) 0.0 0.0 - 0.1 x10E3/uL  Comprehensive metabolic panel  Result Value Ref Range   Glucose 138 (H) 65 - 99 mg/dL   BUN 13 8 - 27 mg/dL    Creatinine, Ser 7.74 0.76 - 1.27 mg/dL   GFR calc non Af Amer 84 >59 mL/min/1.73   GFR calc Af Amer 97 >59 mL/min/1.73   BUN/Creatinine Ratio 14 10 - 24   Sodium 142 134 - 144 mmol/L   Potassium 4.6 3.5 - 5.2 mmol/L   Chloride 107 (H) 96 - 106 mmol/L   CO2 23 20 - 29 mmol/L   Calcium 8.4 (L) 8.6 - 10.2 mg/dL   Total Protein 6.6 6.0 - 8.5 g/dL   Albumin 4.0 3.8 - 4.8 g/dL   Globulin, Total 2.6 1.5 - 4.5 g/dL   Albumin/Globulin Ratio 1.5 1.2 - 2.2   Bilirubin Total 0.6 0.0 - 1.2 mg/dL   Alkaline Phosphatase 96 39 - 117 IU/L   AST 29 0 - 40 IU/L   ALT 40 0 - 44 IU/L  TSH  Result Value Ref Range   TSH 2.110 0.450 - 4.500 uIU/mL  PSA  Result Value Ref Range   Prostate Specific Ag, Serum 0.7 0.0 - 4.0 ng/mL  Lipid Panel w/o Chol/HDL Ratio  Result Value Ref Range   Cholesterol, Total 111 100 - 199 mg/dL   Triglycerides 83 0 - 149 mg/dL   HDL 35 (L) >12 mg/dL   VLDL Cholesterol Cal 17 5 - 40 mg/dL   LDL Chol Calc (NIH) 59 0 - 99 mg/dL  VITAMIN D 25 Hydroxy (Vit-D Deficiency, Fractures)  Result Value Ref Range   Vit D, 25-Hydroxy 9.9 (L) 30.0 - 100.0 ng/mL      Assessment & Plan:   Problem List Items Addressed This Visit   None   Visit Diagnoses    Poison oak    -  Primary   Complete course of medications as prescribed by ED. Recommend oatmeal baths. Can use Benadryl for itching. Return to clinic if symptoms worsen.       Follow up plan: Return if symptoms worsen or fail to improve.  A total of 20 minutes were spent on this encounter today.  When total time is documented, this includes both the face-to-face and non-face-to-face time personally spent before, during  and after the visit on the date of the encounter.

## 2020-05-09 ENCOUNTER — Inpatient Hospital Stay: Payer: Medicare Other | Admitting: Nurse Practitioner

## 2020-05-11 ENCOUNTER — Other Ambulatory Visit: Payer: Self-pay | Admitting: Orthopedic Surgery

## 2020-05-22 ENCOUNTER — Other Ambulatory Visit: Payer: Self-pay | Admitting: Nurse Practitioner

## 2020-05-22 NOTE — Telephone Encounter (Signed)
Requested medication (s) are due for refill today: no  Requested medication (s) are on the active medication list: yes  Last refill:  01/09/2020  Future visit scheduled: yes  Notes to clinic:  this refill cannot be delegated    Requested Prescriptions  Pending Prescriptions Disp Refills   baclofen (LIORESAL) 10 MG tablet [Pharmacy Med Name: Baclofen 10 MG Oral Tablet] 270 tablet 2    Sig: TAKE 1 TABLET BY MOUTH 3  TIMES DAILY      Not Delegated - Analgesics:  Muscle Relaxants Failed - 05/22/2020 12:00 PM      Failed - This refill cannot be delegated      Passed - Valid encounter within last 6 months    Recent Outpatient Visits           3 weeks ago Poison [facility] [place], Clydie Braun, NP   1 year ago Annual physical exam   [facility] [place], Dorie Rank, NP   1 year ago Essential hypertension   [facility] [place], Dorie Rank, NP   1 year ago Essential hypertension   [facility] [place], [place] T, NP   1 year ago Essential hypertension   [facility] [place], Dorie Rank, NP       Future Appointments             In 5 months [employer], [facility]

## 2020-05-22 NOTE — Telephone Encounter (Signed)
Routing to provider  

## 2020-05-23 NOTE — Telephone Encounter (Signed)
Unable to lvm to make this apt/  

## 2020-05-24 ENCOUNTER — Other Ambulatory Visit: Payer: Self-pay | Admitting: Nurse Practitioner

## 2020-05-24 NOTE — Telephone Encounter (Signed)
Unable to lvm to make this apt/  

## 2020-05-24 NOTE — Telephone Encounter (Signed)
Requested medications are due for refill today yes  Requested medications are on the active medication list yes  Last refill 3/24  Last visit 02/2019  Future visit scheduled 10/2020  Notes to clinic Failed protocol due to no valid visit within 6  months, has been 15 months, does have appt coming up in Oct.

## 2020-05-24 NOTE — Telephone Encounter (Signed)
Lvm to make this apt. 

## 2020-05-24 NOTE — Telephone Encounter (Signed)
is due to annual physical which was due in February 2022 -- please get scheduled.

## 2020-05-25 ENCOUNTER — Ambulatory Visit (INDEPENDENT_AMBULATORY_CARE_PROVIDER_SITE_OTHER): Payer: Medicare Other | Admitting: Family Medicine

## 2020-05-25 ENCOUNTER — Encounter: Payer: Self-pay | Admitting: Nurse Practitioner

## 2020-05-25 ENCOUNTER — Encounter: Payer: Self-pay | Admitting: Family Medicine

## 2020-05-25 ENCOUNTER — Other Ambulatory Visit: Payer: Self-pay

## 2020-05-25 VITALS — BP 126/77 | HR 76 | Temp 97.8°F | Wt 264.0 lb

## 2020-05-25 DIAGNOSIS — R21 Rash and other nonspecific skin eruption: Secondary | ICD-10-CM

## 2020-05-25 MED ORDER — PREDNISONE 10 MG PO TABS: ORAL_TABLET | ORAL | 0 refills | Status: DC

## 2020-05-25 MED ORDER — VALACYCLOVIR HCL 1 G PO TABS: 1000.0000 mg | ORAL_TABLET | Freq: Two times a day (BID) | ORAL | 0 refills | Status: AC

## 2020-05-25 NOTE — Telephone Encounter (Signed)
Lvm to make this apt sent letter 

## 2020-05-25 NOTE — Telephone Encounter (Signed)
Lvm to make this apt. 

## 2020-05-25 NOTE — Progress Notes (Signed)
BP 126/77   Pulse 76   Temp 97.8 F (36.6 C)   Wt 264 lb (119.7 kg)   SpO2 97%   BMI 38.99 kg/m    Subjective:    Patient ID: Ryan Schmidt, male    DOB: 1955/05/02, 65 y.o.   MRN: [id number]  HPI: Ryan Schmidt is a 65 y.o. male  Chief Complaint  Patient presents with  . Poison Ivy    Patient states he was seen in ED for poison oak in April. Patient states it cleared up and has now come back. Patient completed all medications requested.    RASH Duration:  days  Location: trunk  Itching: yes Burning: yes Redness: yes Oozing: no Scaling: no Blisters: no Painful: no Fevers: no Change in detergents/soaps/personal care products: no Recent illness: no Recent travel:no History of same: yes Context: stable Alleviating factors: hydrocortisone cream, benadryl and lotion/moisturizer Treatments attempted:hydrocortisone cream, benadryl and lotion/moisturizer Shortness of breath: no  Throat/tongue swelling: no Myalgias/arthralgias: no   Relevant past medical, surgical, family and social history reviewed and updated as indicated. Interim medical history since our last visit reviewed. Allergies and medications reviewed and updated.  Review of Systems  Constitutional: Negative.   Respiratory: Negative.   Cardiovascular: Negative.   Gastrointestinal: Negative.   Musculoskeletal: Negative.   Neurological: Negative.   Psychiatric/Behavioral: Negative.     Per HPI unless specifically indicated above     Objective:    BP 126/77   Pulse 76   Temp 97.8 F (36.6 C)   Wt 264 lb (119.7 kg)   SpO2 97%   BMI 38.99 kg/m   Wt Readings from Last 3 Encounters:  05/25/20 264 lb (119.7 kg)  04/22/20 260 lb (117.9 kg)  10/21/19 256 lb (116.1 kg)    Physical Exam Vitals and nursing note reviewed.  Constitutional:      General: He is not in acute distress.    Appearance: Normal appearance. He is not ill-appearing, toxic-appearing or diaphoretic.  HENT:     Head:  Normocephalic and atraumatic.     Right Ear: External ear normal.     Left Ear: External ear normal.     Nose: Nose normal.     Mouth/Throat:     Mouth: Mucous membranes are moist.     Pharynx: Oropharynx is clear.  Eyes:     General: No scleral icterus.       Right eye: No discharge.        Left eye: No discharge.     Extraocular Movements: Extraocular movements intact.     Conjunctiva/sclera: Conjunctivae normal.     Pupils: Pupils are equal, round, and reactive to light.  Cardiovascular:     Rate and Rhythm: Normal rate and regular rhythm.     Pulses: Normal pulses.     Heart sounds: Normal heart sounds. No murmur heard. No friction rub. No gallop.   Pulmonary:     Effort: Pulmonary effort is normal. No respiratory distress.     Breath sounds: Normal breath sounds. No stridor. No wheezing, rhonchi or rales.  Chest:     Chest wall: No tenderness.  Musculoskeletal:        General: Normal range of motion.     Cervical back: Normal range of motion and neck supple.  Skin:    General: Skin is warm and dry.     Capillary Refill: Capillary refill takes less than 2 seconds.     Coloration: Skin is not jaundiced or pale.  Findings: Rash present. No bruising, erythema or lesion.       Neurological:     General: No focal deficit present.     Mental Status: He is alert and oriented to person, place, and time. Mental status is at baseline.  Psychiatric:        Mood and Affect: Mood normal.        Behavior: Behavior normal.        Thought Content: Thought content normal.        Judgment: Judgment normal.     Results for orders placed or performed in visit on 02/08/19  CBC with Differential/Platelet  Result Value Ref Range   WBC 5.1 3.4 - 10.8 x10E3/uL   RBC 4.04 (L) 4.14 - 5.80 x10E6/uL   Hemoglobin 13.2 13.0 - 17.7 g/dL   Hematocrit 81.4 48.1 - 51.0 %   MCV 94 79 - 97 fL   MCH 32.7 26.6 - 33.0 pg   MCHC 34.6 31.5 - 35.7 g/dL   RDW 85.6 31.4 - 97.0 %   Platelets 204 150  - 450 x10E3/uL   Neutrophils 38 Not Estab. %   Lymphs 50 Not Estab. %   Monocytes 8 Not Estab. %   Eos 3 Not Estab. %   Basos 1 Not Estab. %   Neutrophils Absolute 2.0 1.4 - 7.0 x10E3/uL   Lymphocytes Absolute 2.5 0.7 - 3.1 x10E3/uL   Monocytes Absolute 0.4 0.1 - 0.9 x10E3/uL   EOS (ABSOLUTE) 0.2 0.0 - 0.4 x10E3/uL   Basophils Absolute 0.0 0.0 - 0.2 x10E3/uL   Immature Granulocytes 0 Not Estab. %   Immature Grans (Abs) 0.0 0.0 - 0.1 x10E3/uL  Comprehensive metabolic panel  Result Value Ref Range   Glucose 138 (H) 65 - 99 mg/dL   BUN 13 8 - 27 mg/dL   Creatinine, Ser 2.63 0.76 - 1.27 mg/dL   GFR calc non Af Amer 84 >59 mL/min/1.73   GFR calc Af Amer 97 >59 mL/min/1.73   BUN/Creatinine Ratio 14 10 - 24   Sodium 142 134 - 144 mmol/L   Potassium 4.6 3.5 - 5.2 mmol/L   Chloride 107 (H) 96 - 106 mmol/L   CO2 23 20 - 29 mmol/L   Calcium 8.4 (L) 8.6 - 10.2 mg/dL   Total Protein 6.6 6.0 - 8.5 g/dL   Albumin 4.0 3.8 - 4.8 g/dL   Globulin, Total 2.6 1.5 - 4.5 g/dL   Albumin/Globulin Ratio 1.5 1.2 - 2.2   Bilirubin Total 0.6 0.0 - 1.2 mg/dL   Alkaline Phosphatase 96 39 - 117 IU/L   AST 29 0 - 40 IU/L   ALT 40 0 - 44 IU/L  TSH  Result Value Ref Range   TSH 2.110 0.450 - 4.500 uIU/mL  PSA  Result Value Ref Range   Prostate Specific Ag, Serum 0.7 0.0 - 4.0 ng/mL  Lipid Panel w/o Chol/HDL Ratio  Result Value Ref Range   Cholesterol, Total 111 100 - 199 mg/dL   Triglycerides 83 0 - 149 mg/dL   HDL 35 (L) >78 mg/dL   VLDL Cholesterol Cal 17 5 - 40 mg/dL   LDL Chol Calc (NIH) 59 0 - 99 mg/dL  VITAMIN D 25 Hydroxy (Vit-D Deficiency, Fractures)  Result Value Ref Range   Vit D, 25-Hydroxy 9.9 (L) 30.0 - 100.0 ng/mL      Assessment & Plan:   Problem List Items Addressed This Visit   None   Visit Diagnoses    Rash    -  Primary   Unclear if shingles or contact derm. Will treat with valacyclovir and prednisone. Call if not getting better or getting worse.        Follow up  plan: Return if symptoms worsen or fail to improve.

## 2020-05-29 ENCOUNTER — Encounter
Admission: RE | Admit: 2020-05-29 | Discharge: 2020-05-29 | Disposition: A | Discharge status: Home / Self Care | Payer: Medicare Other | Source: Ambulatory Visit | Attending: Orthopedic Surgery | Admitting: Orthopedic Surgery

## 2020-05-29 ENCOUNTER — Other Ambulatory Visit: Payer: Self-pay

## 2020-05-29 ENCOUNTER — Encounter: Payer: Self-pay | Admitting: Nurse Practitioner

## 2020-05-29 DIAGNOSIS — Z01818 Encounter for other preprocedural examination: Secondary | ICD-10-CM | POA: Diagnosis not present

## 2020-05-29 DIAGNOSIS — Z01810 Encounter for preprocedural cardiovascular examination: Secondary | ICD-10-CM | POA: Diagnosis not present

## 2020-05-29 HISTORY — DX: Nonrheumatic mitral (valve) insufficiency: I34.0

## 2020-05-29 HISTORY — DX: Benign prostatic hyperplasia without lower urinary tract symptoms: N40.0

## 2020-05-29 LAB — COMPREHENSIVE METABOLIC PANEL
ALT: 38 U/L (ref 0–44)
AST: 27 U/L (ref 15–41)
Albumin: 4.1 g/dL (ref 3.5–5.0)
Alkaline Phosphatase: 86 U/L (ref 38–126)
Anion gap: 9 (ref 5–15)
BUN: 14 mg/dL (ref 8–23)
CO2: 27 mmol/L (ref 22–32)
Calcium: 8.6 mg/dL — ABNORMAL LOW (ref 8.9–10.3)
Chloride: 101 mmol/L (ref 98–111)
Creatinine, Ser: 0.92 mg/dL (ref 0.61–1.24)
GFR, Estimated: >60 mL/min (ref >60)
Glucose, Bld: 157 mg/dL — ABNORMAL HIGH (ref 70–99)
Potassium: 3.7 mmol/L (ref 3.5–5.1)
Sodium: 137 mmol/L (ref 135–145)
Total Bilirubin: 0.7 mg/dL (ref 0.3–1.2)
Total Protein: 7.4 g/dL (ref 6.5–8.1)

## 2020-05-29 LAB — CBC WITH DIFFERENTIAL/PLATELET
Abs Immature Granulocytes: 0.1 10*3/uL — ABNORMAL HIGH (ref 0.00–0.07)
Basophils Absolute: 0.1 10*3/uL (ref 0.0–0.1)
Basophils Relative: 1 %
Eosinophils Absolute: 0 10*3/uL (ref 0.0–0.5)
Eosinophils Relative: 0 %
HCT: 38.5 % — ABNORMAL LOW (ref 39.0–52.0)
Hemoglobin: 13.6 g/dL (ref 13.0–17.0)
Immature Granulocytes: 1 %
Lymphocytes Relative: 27 %
Lymphs Abs: 2.6 10*3/uL (ref 0.7–4.0)
MCH: 33.3 pg (ref 26.0–34.0)
MCHC: 35.3 g/dL (ref 30.0–36.0)
MCV: 94.4 fL (ref 80.0–100.0)
Monocytes Absolute: 0.5 10*3/uL (ref 0.1–1.0)
Monocytes Relative: 6 %
Neutro Abs: 6.1 10*3/uL (ref 1.7–7.7)
Neutrophils Relative %: 65 %
Platelets: 226 10*3/uL (ref 150–400)
RBC: 4.08 MIL/uL — ABNORMAL LOW (ref 4.22–5.81)
RDW: 12.2 % (ref 11.5–15.5)
WBC: 9.4 10*3/uL (ref 4.0–10.5)
nRBC: 0 % (ref 0.0–0.2)

## 2020-05-29 LAB — URINALYSIS, ROUTINE W REFLEX MICROSCOPIC
Bilirubin Urine: NEGATIVE
Glucose, UA: NEGATIVE mg/dL
Hgb urine dipstick: NEGATIVE
Ketones, ur: NEGATIVE mg/dL
Leukocytes,Ua: NEGATIVE
Nitrite: NEGATIVE
Protein, ur: NEGATIVE mg/dL
Specific Gravity, Urine: 1.015 (ref 1.005–1.030)
pH: 5 (ref 5.0–8.0)

## 2020-05-29 LAB — SURGICAL PCR SCREEN
MRSA, PCR: NEGATIVE
Staphylococcus aureus: NEGATIVE

## 2020-05-29 NOTE — Telephone Encounter (Signed)
Lvm to make this apt. Sent letter.  

## 2020-05-29 NOTE — Telephone Encounter (Signed)
FYI

## 2020-05-29 NOTE — Pre-Procedure Instructions (Signed)
RASH NOTED ON LEFT CHEST THAT PATIENT SAYS HE WAS TOLD MAY BE SHINGLES -HE DOES NOT THINK IT IS .  HE IS TAKING VALACYCLOVIR FOR 10 DAYS.  Quentin Mulling NP RECOMMENDS PT CONTACT DR [facility] OFFICE AND MAKE HIM AWARE OF RASH AND TREATMENT. PT INFORMED.

## 2020-05-29 NOTE — Patient Instructions (Addendum)
COVID TEST    Jun 05, 2020 Tuesday AT [facility]  Your procedure is scheduled on: June 07, 2020 THURSDAY Report to the Registration Desk on the 1st floor of the [facility] To find out your arrival time, please call [phone] between 1PM - 3PM on: Wednesday June 06, 2020  REMEMBER: Instructions that are not followed completely may result in serious medical risk, up to and including death; or upon the discretion of your surgeon and anesthesiologist your surgery may need to be rescheduled.  Do not eat food after midnight the night before surgery.  No gum chewing, lozengers or hard candies.  You may however, drink CLEAR liquids up to 2 hours before you are scheduled to arrive for your surgery. Do not drink anything within 2 hours of your scheduled arrival time.  Clear liquids include: - water  - apple juice without pulp - gatorade (not RED, PURPLE, OR BLUE) - black coffee or tea (Do NOT add milk or creamers to the coffee or tea) Do NOT drink anything that is not on this list.  Type 1 and Type 2 diabetics should only drink water.  In addition, your doctor has ordered for you to drink the provided  Ensure Pre-Surgery Clear Carbohydrate Drink  Drinking this carbohydrate drink up to two hours before surgery helps to reduce insulin resistance and improve patient outcomes. Please complete drinking 2 hours prior to scheduled arrival time.  TAKE THESE MEDICATIONS THE MORNING OF SURGERY WITH A SIP OF WATER: NONE  One week prior to surgery: Stop Anti-inflammatories (NSAIDS) such as Advil, Aleve, Ibuprofen, Motrin, Naproxen, Naprosyn and ASPIRIN OR Aspirin based products such as Excedrin, Goodys Powder, BC Powder.    USE TYLENOL ONLY FOR PAIN  Stop ANY OVER THE COUNTER supplements until after surgery.  No Alcohol for 24 hours before or after surgery.  No Smoking including e-cigarettes for 24 hours prior to surgery.  No chewable tobacco products for at least 6 hours prior to  surgery.  No nicotine patches on the day of surgery.  Do not use any "recreational" drugs for at least a week prior to your surgery.  Please be advised that the combination of cocaine and anesthesia may have negative outcomes, up to and including death. If you test positive for cocaine, your surgery will be cancelled.  On the morning of surgery brush your teeth with toothpaste and water, you may rinse your mouth with mouthwash if you wish. Do not swallow any toothpaste or mouthwash.  Do not wear jewelry, make-up, hairpins, clips or nail polish.  Do not wear lotions, powders, or perfumes.   Do not shave body from the neck down 48 hours prior to surgery just in case you cut yourself which could leave a site for infection.  Also, freshly shaved skin may become irritated if using the CHG soap.  Contact lenses, hearing aids and dentures may not be worn into surgery.  Do not bring valuables to the hospital. [facility] is not responsible for any missing/lost belongings or valuables.   Use CHG Soap as directed on instruction sheet.  Notify your doctor if there is any change in your medical condition (cold, fever, infection).  Wear comfortable clothing (specific to your surgery type) to the hospital.  Plan for stool softeners for home use; pain medications have a tendency to cause constipation. You can also help prevent constipation by eating foods high in fiber such as fruits and vegetables and drinking plenty of fluids as your  diet allows.  After surgery, you can help prevent lung complications by doing breathing exercises.  Take deep breaths and cough every 1-2 hours. Your doctor may order a device called an Incentive Spirometer to help you take deep breaths. When coughing or sneezing, hold a pillow firmly against your incision with both hands. This is called "splinting." Doing this helps protect your incision. It also decreases belly discomfort.  If you are being admitted to the  hospital overnight, YOU MAY BRING A BAG WITH YOU    Please call the Pre-admissions Testing Dept. at [phone] if you have any questions about these instructions.  Surgery Visitation Policy:  Patients undergoing a surgery or procedure may have one family member or support person with them as long as that person is not COVID-19 positive or experiencing its symptoms.  That person may remain in the waiting area during the procedure.  Inpatient Visitation:    Visiting hours are 7 a.m. to 8 p.m. Inpatients will be allowed two visitors daily. The visitors may change each day during the patient's stay. No visitors under the age of 64. Any visitor under the age of 58 must be accompanied by an adult. The visitor must pass COVID-19 screenings, use hand sanitizer when entering and exiting the patient's room and wear a mask at all times, including in the patient's room. Patients must also wear a mask when staff or their visitor are in the room. Masking is required regardless of vaccination status.

## 2020-06-05 ENCOUNTER — Other Ambulatory Visit: Payer: Self-pay

## 2020-06-05 ENCOUNTER — Other Ambulatory Visit
Admission: RE | Admit: 2020-06-05 | Discharge: 2020-06-05 | Disposition: A | Discharge status: Home / Self Care | Payer: Medicare Other | Source: Ambulatory Visit | Attending: Orthopedic Surgery | Admitting: Orthopedic Surgery

## 2020-06-05 DIAGNOSIS — Z79899 Other long term (current) drug therapy: Secondary | ICD-10-CM | POA: Diagnosis not present

## 2020-06-05 DIAGNOSIS — Z87891 Personal history of nicotine dependence: Secondary | ICD-10-CM | POA: Diagnosis not present

## 2020-06-05 DIAGNOSIS — I340 Nonrheumatic mitral (valve) insufficiency: Secondary | ICD-10-CM | POA: Diagnosis not present

## 2020-06-05 DIAGNOSIS — Z01812 Encounter for preprocedural laboratory examination: Secondary | ICD-10-CM | POA: Insufficient documentation

## 2020-06-05 DIAGNOSIS — Z20822 Contact with and (suspected) exposure to covid-19: Secondary | ICD-10-CM | POA: Insufficient documentation

## 2020-06-05 DIAGNOSIS — Z888 Allergy status to other drugs, medicaments and biological substances status: Secondary | ICD-10-CM | POA: Diagnosis not present

## 2020-06-05 DIAGNOSIS — M1712 Unilateral primary osteoarthritis, left knee: Secondary | ICD-10-CM | POA: Diagnosis not present

## 2020-06-05 DIAGNOSIS — E782 Mixed hyperlipidemia: Secondary | ICD-10-CM | POA: Diagnosis not present

## 2020-06-05 DIAGNOSIS — Z96652 Presence of left artificial knee joint: Secondary | ICD-10-CM | POA: Diagnosis not present

## 2020-06-05 DIAGNOSIS — E559 Vitamin D deficiency, unspecified: Secondary | ICD-10-CM | POA: Diagnosis not present

## 2020-06-05 DIAGNOSIS — M1732 Unilateral post-traumatic osteoarthritis, left knee: Secondary | ICD-10-CM | POA: Diagnosis not present

## 2020-06-05 DIAGNOSIS — R251 Tremor, unspecified: Secondary | ICD-10-CM | POA: Diagnosis not present

## 2020-06-05 DIAGNOSIS — Z885 Allergy status to narcotic agent status: Secondary | ICD-10-CM | POA: Diagnosis not present

## 2020-06-05 DIAGNOSIS — E669 Obesity, unspecified: Secondary | ICD-10-CM | POA: Diagnosis present

## 2020-06-05 DIAGNOSIS — Z6838 Body mass index (BMI) 38.0-38.9, adult: Secondary | ICD-10-CM | POA: Diagnosis not present

## 2020-06-05 DIAGNOSIS — Z880 Allergy status to penicillin: Secondary | ICD-10-CM | POA: Diagnosis not present

## 2020-06-05 DIAGNOSIS — N400 Enlarged prostate without lower urinary tract symptoms: Secondary | ICD-10-CM | POA: Diagnosis present

## 2020-06-05 DIAGNOSIS — I10 Essential (primary) hypertension: Secondary | ICD-10-CM | POA: Diagnosis not present

## 2020-06-05 LAB — SARS CORONAVIRUS 2 (TAT 6-24 HRS): SARS Coronavirus 2: NEGATIVE

## 2020-06-07 ENCOUNTER — Encounter
Admission: RE | Disposition: A | Discharge status: Home / Self Care | Payer: Self-pay | Source: Home / Self Care | Attending: Orthopedic Surgery

## 2020-06-07 ENCOUNTER — Inpatient Hospital Stay: Payer: Medicare Other

## 2020-06-07 ENCOUNTER — Inpatient Hospital Stay
Admission: RE | Admit: 2020-06-07 | Discharge: 2020-06-08 | DRG: 470 | Disposition: A | Discharge status: Home Health | Payer: Medicare Other | Attending: Orthopedic Surgery | Admitting: Orthopedic Surgery

## 2020-06-07 ENCOUNTER — Encounter: Payer: Self-pay | Admitting: Orthopedic Surgery

## 2020-06-07 ENCOUNTER — Other Ambulatory Visit: Payer: Self-pay

## 2020-06-07 DIAGNOSIS — I10 Essential (primary) hypertension: Secondary | ICD-10-CM | POA: Diagnosis present

## 2020-06-07 DIAGNOSIS — Z79899 Other long term (current) drug therapy: Secondary | ICD-10-CM | POA: Diagnosis not present

## 2020-06-07 DIAGNOSIS — E782 Mixed hyperlipidemia: Secondary | ICD-10-CM | POA: Diagnosis present

## 2020-06-07 DIAGNOSIS — Z885 Allergy status to narcotic agent status: Secondary | ICD-10-CM | POA: Diagnosis not present

## 2020-06-07 DIAGNOSIS — Z87891 Personal history of nicotine dependence: Secondary | ICD-10-CM

## 2020-06-07 DIAGNOSIS — E669 Obesity, unspecified: Secondary | ICD-10-CM | POA: Diagnosis present

## 2020-06-07 DIAGNOSIS — I340 Nonrheumatic mitral (valve) insufficiency: Secondary | ICD-10-CM | POA: Diagnosis present

## 2020-06-07 DIAGNOSIS — Z6838 Body mass index (BMI) 38.0-38.9, adult: Secondary | ICD-10-CM | POA: Diagnosis not present

## 2020-06-07 DIAGNOSIS — R251 Tremor, unspecified: Secondary | ICD-10-CM | POA: Diagnosis present

## 2020-06-07 DIAGNOSIS — E559 Vitamin D deficiency, unspecified: Secondary | ICD-10-CM | POA: Diagnosis present

## 2020-06-07 DIAGNOSIS — N400 Enlarged prostate without lower urinary tract symptoms: Secondary | ICD-10-CM | POA: Diagnosis present

## 2020-06-07 DIAGNOSIS — Z20822 Contact with and (suspected) exposure to covid-19: Secondary | ICD-10-CM | POA: Diagnosis present

## 2020-06-07 DIAGNOSIS — M1732 Unilateral post-traumatic osteoarthritis, left knee: Secondary | ICD-10-CM | POA: Diagnosis present

## 2020-06-07 DIAGNOSIS — Z880 Allergy status to penicillin: Secondary | ICD-10-CM

## 2020-06-07 DIAGNOSIS — Z888 Allergy status to other drugs, medicaments and biological substances status: Secondary | ICD-10-CM | POA: Diagnosis not present

## 2020-06-07 DIAGNOSIS — G8918 Other acute postprocedural pain: Secondary | ICD-10-CM

## 2020-06-07 DIAGNOSIS — Z96652 Presence of left artificial knee joint: Secondary | ICD-10-CM

## 2020-06-07 HISTORY — PX: TOTAL KNEE ARTHROPLASTY: SHX125

## 2020-06-07 LAB — CBC
HCT: 38.8 % — ABNORMAL LOW (ref 39.0–52.0)
Hemoglobin: 13.5 g/dL (ref 13.0–17.0)
MCH: 33.5 pg (ref 26.0–34.0)
MCHC: 34.8 g/dL (ref 30.0–36.0)
MCV: 96.3 fL (ref 80.0–100.0)
Platelets: 187 10*3/uL (ref 150–400)
RBC: 4.03 MIL/uL — ABNORMAL LOW (ref 4.22–5.81)
RDW: 12.5 % (ref 11.5–15.5)
WBC: 11.2 10*3/uL — ABNORMAL HIGH (ref 4.0–10.5)
nRBC: 0 % (ref 0.0–0.2)

## 2020-06-07 LAB — CREATININE, SERUM
Creatinine, Ser: 1.03 mg/dL (ref 0.61–1.24)
GFR, Estimated: >60 mL/min (ref >60)

## 2020-06-07 LAB — ABO/RH: ABO/RH(D): O POS

## 2020-06-07 SURGERY — ARTHROPLASTY, KNEE, TOTAL
Anesthesia: Spinal | Site: Knee | Laterality: Left

## 2020-06-07 MED ORDER — CEFAZOLIN SODIUM-DEXTROSE 2-4 GM/100ML-% IV SOLN
2.0000 g | INTRAVENOUS | Status: AC
  Administered 2020-06-07: 2 g via INTRAVENOUS

## 2020-06-07 MED ORDER — ONDANSETRON HCL 4 MG/2ML IJ SOLN
INTRAMUSCULAR | Status: DC | PRN
  Administered 2020-06-07: 4 mg via INTRAVENOUS

## 2020-06-07 MED ORDER — BUPIVACAINE-EPINEPHRINE (PF) 0.25% -1:200000 IJ SOLN
INTRAMUSCULAR | Status: DC | PRN
  Administered 2020-06-07: 30 mL

## 2020-06-07 MED ORDER — BUPIVACAINE HCL (PF) 0.5 % IJ SOLN
INTRAMUSCULAR | Status: AC
  Filled 2020-06-07: qty 10

## 2020-06-07 MED ORDER — ENOXAPARIN SODIUM 30 MG/0.3ML IJ SOSY
30.0000 mg | PREFILLED_SYRINGE | Freq: Two times a day (BID) | INTRAMUSCULAR | Status: DC
  Administered 2020-06-08: 30 mg via SUBCUTANEOUS
  Filled 2020-06-07: qty 0.3

## 2020-06-07 MED ORDER — MIDAZOLAM HCL 5 MG/5ML IJ SOLN
INTRAMUSCULAR | Status: DC | PRN
  Administered 2020-06-07: 2 mg via INTRAVENOUS

## 2020-06-07 MED ORDER — EPHEDRINE SULFATE 50 MG/ML IJ SOLN
INTRAMUSCULAR | Status: DC | PRN
  Administered 2020-06-07: 7.5 mg via INTRAVENOUS

## 2020-06-07 MED ORDER — FAMOTIDINE 20 MG PO TABS
ORAL_TABLET | ORAL | Status: AC
  Administered 2020-06-07: 20 mg via ORAL
  Filled 2020-06-07: qty 1

## 2020-06-07 MED ORDER — ALUM & MAG HYDROXIDE-SIMETH 200-200-20 MG/5ML PO SUSP: 30.0000 mL | ORAL | Status: DC | PRN

## 2020-06-07 MED ORDER — BUPIVACAINE-EPINEPHRINE (PF) 0.25% -1:200000 IJ SOLN
INTRAMUSCULAR | Status: AC
  Filled 2020-06-07: qty 30

## 2020-06-07 MED ORDER — CEFAZOLIN SODIUM-DEXTROSE 2-4 GM/100ML-% IV SOLN
INTRAVENOUS | Status: AC
  Filled 2020-06-07: qty 100

## 2020-06-07 MED ORDER — OXYCODONE HCL 5 MG PO TABS
10.0000 mg | ORAL_TABLET | ORAL | Status: DC | PRN
  Administered 2020-06-07: 15 mg via ORAL
  Filled 2020-06-07: qty 3

## 2020-06-07 MED ORDER — SODIUM CHLORIDE FLUSH 0.9 % IV SOLN
INTRAVENOUS | Status: AC
  Filled 2020-06-07: qty 40

## 2020-06-07 MED ORDER — TRAMADOL HCL 50 MG PO TABS
50.0000 mg | ORAL_TABLET | Freq: Four times a day (QID) | ORAL | Status: DC
  Administered 2020-06-07 – 2020-06-08 (×4): 50 mg via ORAL
  Filled 2020-06-07 (×4): qty 1

## 2020-06-07 MED ORDER — MIDAZOLAM HCL 2 MG/2ML IJ SOLN
INTRAMUSCULAR | Status: AC
  Filled 2020-06-07: qty 2

## 2020-06-07 MED ORDER — ACETAMINOPHEN 325 MG PO TABS: 325.0000 mg | ORAL_TABLET | Freq: Four times a day (QID) | ORAL | Status: DC | PRN

## 2020-06-07 MED ORDER — ONDANSETRON HCL 4 MG PO TABS: 4.0000 mg | ORAL_TABLET | Freq: Four times a day (QID) | ORAL | Status: DC | PRN

## 2020-06-07 MED ORDER — PHENYLEPHRINE HCL (PRESSORS) 10 MG/ML IV SOLN
INTRAVENOUS | Status: AC
  Filled 2020-06-07: qty 1

## 2020-06-07 MED ORDER — PHENYLEPHRINE HCL (PRESSORS) 10 MG/ML IV SOLN
INTRAVENOUS | Status: DC | PRN
  Administered 2020-06-07: 100 ug via INTRAVENOUS
  Administered 2020-06-07: 200 ug via INTRAVENOUS

## 2020-06-07 MED ORDER — BUPIVACAINE HCL (PF) 0.5 % IJ SOLN
INTRAMUSCULAR | Status: DC | PRN
  Administered 2020-06-07: 3 mL

## 2020-06-07 MED ORDER — PHENOL 1.4 % MT LIQD
1.0000 | OROMUCOSAL | Status: DC | PRN
  Filled 2020-06-07: qty 177

## 2020-06-07 MED ORDER — TAMSULOSIN HCL 0.4 MG PO CAPS
0.4000 mg | ORAL_CAPSULE | Freq: Every day | ORAL | Status: DC
  Administered 2020-06-07: 0.4 mg via ORAL
  Filled 2020-06-07: qty 1

## 2020-06-07 MED ORDER — CEFAZOLIN SODIUM-DEXTROSE 2-4 GM/100ML-% IV SOLN
2.0000 g | Freq: Four times a day (QID) | INTRAVENOUS | Status: AC
  Administered 2020-06-07 (×2): 2 g via INTRAVENOUS
  Filled 2020-06-07 (×2): qty 100

## 2020-06-07 MED ORDER — ORAL CARE MOUTH RINSE: 15.0000 mL | Freq: Once | OROMUCOSAL | Status: AC

## 2020-06-07 MED ORDER — PROPOFOL 1000 MG/100ML IV EMUL
INTRAVENOUS | Status: AC
  Filled 2020-06-07: qty 100

## 2020-06-07 MED ORDER — VANCOMYCIN HCL 1000 MG IV SOLR
INTRAVENOUS | Status: AC
  Filled 2020-06-07: qty 1000

## 2020-06-07 MED ORDER — MENTHOL 3 MG MT LOZG
1.0000 | LOZENGE | OROMUCOSAL | Status: DC | PRN
  Filled 2020-06-07: qty 9

## 2020-06-07 MED ORDER — METOCLOPRAMIDE HCL 5 MG/ML IJ SOLN: 5.0000 mg | Freq: Three times a day (TID) | INTRAMUSCULAR | Status: DC | PRN

## 2020-06-07 MED ORDER — BISACODYL 10 MG RE SUPP
10.0000 mg | Freq: Every day | RECTAL | Status: DC | PRN
  Filled 2020-06-07: qty 1

## 2020-06-07 MED ORDER — MAGNESIUM CITRATE PO SOLN
1.0000 | Freq: Once | ORAL | Status: DC | PRN
  Filled 2020-06-07: qty 296

## 2020-06-07 MED ORDER — DIPHENHYDRAMINE HCL 12.5 MG/5ML PO ELIX
12.5000 mg | ORAL_SOLUTION | ORAL | Status: DC | PRN
  Filled 2020-06-07: qty 10

## 2020-06-07 MED ORDER — FENTANYL CITRATE (PF) 100 MCG/2ML IJ SOLN: 25.0000 ug | INTRAMUSCULAR | Status: DC | PRN

## 2020-06-07 MED ORDER — DOCUSATE SODIUM 100 MG PO CAPS
100.0000 mg | ORAL_CAPSULE | Freq: Two times a day (BID) | ORAL | Status: DC
  Administered 2020-06-07 – 2020-06-08 (×2): 100 mg via ORAL
  Filled 2020-06-07 (×2): qty 1

## 2020-06-07 MED ORDER — METOCLOPRAMIDE HCL 10 MG PO TABS
5.0000 mg | ORAL_TABLET | Freq: Three times a day (TID) | ORAL | Status: DC | PRN
Start: 2020-06-07 — End: 2020-06-08

## 2020-06-07 MED ORDER — ONDANSETRON HCL 4 MG/2ML IJ SOLN
INTRAMUSCULAR | Status: AC
  Filled 2020-06-07: qty 2

## 2020-06-07 MED ORDER — POLYETHYLENE GLYCOL 3350 17 G PO PACK
17.0000 g | PACK | Freq: Every day | ORAL | Status: DC | PRN
  Filled 2020-06-07: qty 1

## 2020-06-07 MED ORDER — HYDROCHLOROTHIAZIDE 25 MG PO TABS
25.0000 mg | ORAL_TABLET | Freq: Every day | ORAL | Status: DC
  Administered 2020-06-08: 25 mg via ORAL
  Filled 2020-06-07: qty 1

## 2020-06-07 MED ORDER — DEXAMETHASONE SODIUM PHOSPHATE 10 MG/ML IJ SOLN
INTRAMUSCULAR | Status: AC
  Filled 2020-06-07: qty 1

## 2020-06-07 MED ORDER — PROPOFOL 10 MG/ML IV BOLUS
INTRAVENOUS | Status: DC | PRN
  Administered 2020-06-07: 20 mg via INTRAVENOUS
  Administered 2020-06-07: 30 mg via INTRAVENOUS
  Administered 2020-06-07 (×2): 20 mg via INTRAVENOUS

## 2020-06-07 MED ORDER — ZOLPIDEM TARTRATE 5 MG PO TABS
5.0000 mg | ORAL_TABLET | Freq: Every evening | ORAL | Status: DC | PRN
  Administered 2020-06-07: 5 mg via ORAL
  Filled 2020-06-07: qty 1

## 2020-06-07 MED ORDER — BACLOFEN 10 MG PO TABS
10.0000 mg | ORAL_TABLET | Freq: Three times a day (TID) | ORAL | Status: DC
  Administered 2020-06-07: 10 mg via ORAL
  Filled 2020-06-07 (×5): qty 1

## 2020-06-07 MED ORDER — LACTATED RINGERS IV SOLN: INTRAVENOUS | Status: DC

## 2020-06-07 MED ORDER — ONDANSETRON HCL 4 MG/2ML IJ SOLN: 4.0000 mg | Freq: Four times a day (QID) | INTRAMUSCULAR | Status: DC | PRN

## 2020-06-07 MED ORDER — PROPOFOL 500 MG/50ML IV EMUL
INTRAVENOUS | Status: DC | PRN
  Administered 2020-06-07: 80 ug/kg/min via INTRAVENOUS

## 2020-06-07 MED ORDER — OXYCODONE HCL 5 MG PO TABS: 5.0000 mg | ORAL_TABLET | ORAL | Status: DC | PRN

## 2020-06-07 MED ORDER — DEXAMETHASONE SODIUM PHOSPHATE 10 MG/ML IJ SOLN
INTRAMUSCULAR | Status: DC | PRN
  Administered 2020-06-07: 5 mg via INTRAVENOUS

## 2020-06-07 MED ORDER — ONDANSETRON HCL 4 MG/2ML IJ SOLN: 4.0000 mg | Freq: Once | INTRAMUSCULAR | Status: DC | PRN

## 2020-06-07 MED ORDER — SODIUM CHLORIDE 0.9 % IV SOLN
INTRAVENOUS | Status: DC | PRN
  Administered 2020-06-07: 60 mL

## 2020-06-07 MED ORDER — FAMOTIDINE 20 MG PO TABS: 20.0000 mg | ORAL_TABLET | Freq: Once | ORAL | Status: AC

## 2020-06-07 MED ORDER — SUCCINYLCHOLINE CHLORIDE 200 MG/10ML IV SOSY
PREFILLED_SYRINGE | INTRAVENOUS | Status: AC
  Filled 2020-06-07: qty 10

## 2020-06-07 MED ORDER — MORPHINE SULFATE (PF) 10 MG/ML IV SOLN
INTRAVENOUS | Status: AC
  Filled 2020-06-07: qty 1

## 2020-06-07 MED ORDER — BUPIVACAINE LIPOSOME 1.3 % IJ SUSP
INTRAMUSCULAR | Status: AC
  Filled 2020-06-07: qty 20

## 2020-06-07 MED ORDER — HYDROMORPHONE HCL 1 MG/ML IJ SOLN: 0.5000 mg | INTRAMUSCULAR | Status: DC | PRN

## 2020-06-07 MED ORDER — ROCURONIUM BROMIDE 10 MG/ML (PF) SYRINGE
PREFILLED_SYRINGE | INTRAVENOUS | Status: AC
  Filled 2020-06-07: qty 10

## 2020-06-07 MED ORDER — FENTANYL CITRATE (PF) 100 MCG/2ML IJ SOLN
INTRAMUSCULAR | Status: DC | PRN
  Administered 2020-06-07: 50 ug via INTRAVENOUS

## 2020-06-07 MED ORDER — ATORVASTATIN CALCIUM 20 MG PO TABS
40.0000 mg | ORAL_TABLET | Freq: Every day | ORAL | Status: DC
  Administered 2020-06-07: 40 mg via ORAL
  Filled 2020-06-07: qty 2

## 2020-06-07 MED ORDER — METHOCARBAMOL 500 MG PO TABS
500.0000 mg | ORAL_TABLET | Freq: Four times a day (QID) | ORAL | Status: DC | PRN
  Administered 2020-06-07 – 2020-06-08 (×2): 500 mg via ORAL
  Filled 2020-06-07 (×2): qty 1

## 2020-06-07 MED ORDER — SODIUM CHLORIDE 0.9 % IV SOLN
INTRAVENOUS | Status: DC | PRN
  Administered 2020-06-07: 40 ug/min via INTRAVENOUS

## 2020-06-07 MED ORDER — PANTOPRAZOLE SODIUM 40 MG PO TBEC
40.0000 mg | DELAYED_RELEASE_TABLET | Freq: Every day | ORAL | Status: DC
  Administered 2020-06-08: 40 mg via ORAL
  Filled 2020-06-07: qty 1

## 2020-06-07 MED ORDER — LIDOCAINE HCL (PF) 2 % IJ SOLN
INTRAMUSCULAR | Status: AC
  Filled 2020-06-07: qty 10

## 2020-06-07 MED ORDER — SODIUM CHLORIDE 0.9 % IV SOLN: INTRAVENOUS | Status: DC

## 2020-06-07 MED ORDER — FENTANYL CITRATE (PF) 100 MCG/2ML IJ SOLN
INTRAMUSCULAR | Status: AC
  Filled 2020-06-07: qty 2

## 2020-06-07 MED ORDER — METHOCARBAMOL 1000 MG/10ML IJ SOLN
500.0000 mg | Freq: Four times a day (QID) | INTRAMUSCULAR | Status: DC | PRN
  Filled 2020-06-07: qty 5

## 2020-06-07 MED ORDER — MORPHINE SULFATE (PF) 10 MG/ML IV SOLN
INTRAVENOUS | Status: DC | PRN
  Administered 2020-06-07: 10 mg

## 2020-06-07 MED ORDER — CHLORHEXIDINE GLUCONATE 0.12 % MT SOLN
OROMUCOSAL | Status: AC
  Administered 2020-06-07: 15 mL via OROMUCOSAL
  Filled 2020-06-07: qty 15

## 2020-06-07 MED ORDER — CHLORHEXIDINE GLUCONATE 0.12 % MT SOLN
15.0000 mL | Freq: Once | OROMUCOSAL | Status: AC
  Administered 2020-06-07: 15 mL via OROMUCOSAL

## 2020-06-07 SURGICAL SUPPLY — 72 items
BLADE SAGITTAL 25.0X1.19X90 (BLADE) ×2 IMPLANT
BLADE SAW 90X13X1.19 OSCILLAT (BLADE) ×2 IMPLANT
BLOCK CUTTING FEMUR SZ6 LEFT (MISCELLANEOUS) ×2 IMPLANT
BLOCK CUTTING TIBIAL 5 LT (MISCELLANEOUS) ×2 IMPLANT
BNDG ELASTIC 6X5.8 VLCR STR LF (GAUZE/BANDAGES/DRESSINGS) ×2 IMPLANT
CANISTER SUCT 1200ML W/VALVE (MISCELLANEOUS) ×2 IMPLANT
CANISTER WOUND CARE 500ML ATS (WOUND CARE) ×2 IMPLANT
CEMENT HV SMART SET (Cement) ×4 IMPLANT
CHLORAPREP W/TINT 26 (MISCELLANEOUS) ×4 IMPLANT
COOLER POLAR GLACIER W/PUMP (MISCELLANEOUS) ×2 IMPLANT
COVER WAND RF STERILE (DRAPES) ×2 IMPLANT
CUFF TOURN SGL QUICK 24 (TOURNIQUET CUFF)
CUFF TOURN SGL QUICK 34 (TOURNIQUET CUFF)
CUFF TRNQT CYL 24X4X16.5-23 (TOURNIQUET CUFF) IMPLANT
CUFF TRNQT CYL 34X4.125X (TOURNIQUET CUFF) IMPLANT
DRAPE 3/4 80X56 (DRAPES) ×4 IMPLANT
DRSG MEPILEX SACRM 8.7X9.8 (GAUZE/BANDAGES/DRESSINGS) ×2 IMPLANT
ELECT CAUTERY BLADE 6.4 (BLADE) ×2 IMPLANT
ELECT REM PT RETURN 9FT ADLT (ELECTROSURGICAL) ×2
ELECTRODE REM PT RTRN 9FT ADLT (ELECTROSURGICAL) ×1 IMPLANT
FEMORAL COMP SZ6P LEFT (Femur) ×2 IMPLANT
FEMUR BONE MODEL 4.9010 MEDACT (MISCELLANEOUS) ×2 IMPLANT
GAUZE SPONGE 4X4 12PLY STRL (GAUZE/BANDAGES/DRESSINGS) ×2 IMPLANT
GAUZE XEROFORM 1X8 LF (GAUZE/BANDAGES/DRESSINGS) ×2 IMPLANT
GLOVE SURG ORTHO LTX SZ8 (GLOVE) ×2 IMPLANT
GLOVE SURG SYN 9.0  PF PI (GLOVE) ×1
GLOVE SURG SYN 9.0 PF PI (GLOVE) ×1 IMPLANT
GLOVE SURG UNDER LTX SZ8 (GLOVE) ×2 IMPLANT
GLOVE SURG UNDER POLY LF SZ9 (GLOVE) ×2 IMPLANT
GOWN SRG 2XL LVL 4 RGLN SLV (GOWNS) ×1 IMPLANT
GOWN STRL NON-REIN 2XL LVL4 (GOWNS) ×1
GOWN STRL REUS W/ TWL LRG LVL3 (GOWN DISPOSABLE) ×1 IMPLANT
GOWN STRL REUS W/ TWL XL LVL3 (GOWN DISPOSABLE) ×1 IMPLANT
GOWN STRL REUS W/TWL LRG LVL3 (GOWN DISPOSABLE) ×1
GOWN STRL REUS W/TWL XL LVL3 (GOWN DISPOSABLE) ×1
HOLDER FOLEY CATH W/STRAP (MISCELLANEOUS) ×2 IMPLANT
HOOD PEEL AWAY FLYTE STAYCOOL (MISCELLANEOUS) ×4 IMPLANT
INSERT TIBIAL FIXED SZ5 LEFT (Insert) ×2 IMPLANT
IRRIGATION SURGIPHOR STRL (IV SOLUTION) IMPLANT
IV NS IRRIG 3000ML ARTHROMATIC (IV SOLUTION) ×2 IMPLANT
KIT PREVENA INCISION MGT20CM45 (CANNISTER) ×2 IMPLANT
KIT STIMULAN RAPID CURE 5CC (Orthopedic Implant) ×2 IMPLANT
KIT TURNOVER KIT A (KITS) ×2 IMPLANT
MANIFOLD NEPTUNE II (INSTRUMENTS) ×2 IMPLANT
NDL SAFETY ECLIPSE 18X1.5 (NEEDLE) ×1 IMPLANT
NEEDLE HYPO 18GX1.5 SHARP (NEEDLE) ×1
NEEDLE SPNL 18GX3.5 QUINCKE PK (NEEDLE) ×2 IMPLANT
NEEDLE SPNL 20GX3.5 QUINCKE YW (NEEDLE) ×2 IMPLANT
NS IRRIG 1000ML POUR BTL (IV SOLUTION) ×2 IMPLANT
PACK TOTAL KNEE (MISCELLANEOUS) ×2 IMPLANT
PAD WRAPON POLAR KNEE (MISCELLANEOUS) ×1 IMPLANT
PATELLA RESURFACING MEDACTA SZ (Bone Implant) ×2 IMPLANT
PENCIL SMOKE EVACUATOR COATED (MISCELLANEOUS) ×2 IMPLANT
PULSAVAC PLUS IRRIG FAN TIP (DISPOSABLE) ×2
SCALPEL PROTECTED #10 DISP (BLADE) ×4 IMPLANT
STAPLER SKIN PROX 35W (STAPLE) ×2 IMPLANT
STEM EXTENSION 11MMX30MM (Stem) ×2 IMPLANT
SUCTION FRAZIER HANDLE 10FR (MISCELLANEOUS) ×1
SUCTION TUBE FRAZIER 10FR DISP (MISCELLANEOUS) ×1 IMPLANT
SUT DVC 2 QUILL PDO  T11 36X36 (SUTURE) ×1
SUT DVC 2 QUILL PDO T11 36X36 (SUTURE) ×1 IMPLANT
SUT ETHIBOND 2 V 37 (SUTURE) IMPLANT
SUT V-LOC 90 ABS DVC 3-0 CL (SUTURE) ×2 IMPLANT
SYR 20ML LL LF (SYRINGE) ×2 IMPLANT
SYR 50ML LL SCALE MARK (SYRINGE) ×4 IMPLANT
TIB TRAY FIXED SZ5 L (Joint) ×2 IMPLANT
TIBIAL BONE MODEL LEFT (MISCELLANEOUS) ×2 IMPLANT
TIP FAN IRRIG PULSAVAC PLUS (DISPOSABLE) ×1 IMPLANT
TOWEL OR 17X26 4PK STRL BLUE (TOWEL DISPOSABLE) ×2 IMPLANT
TOWER CARTRIDGE SMART MIX (DISPOSABLE) ×2 IMPLANT
TRAY FOLEY MTR SLVR 16FR STAT (SET/KITS/TRAYS/PACK) ×2 IMPLANT
WRAPON POLAR PAD KNEE (MISCELLANEOUS) ×2

## 2020-06-07 NOTE — Evaluation (Signed)
Occupational Therapy Evaluation Patient Details Name: Ryan Schmidt MRN: [id number] DOB: Oct 17, 1955 Today's Date: 06/07/2020    History of Present Illness Pt admitted for L TKR. History includes HLD, HTN, and obesity.   Clinical Impression   Pt seen for OT evaluation this date, POD#0 from above surgery. Pt was independent in all ADL and is eager to return to PLOF with less pain and improved safety and independence. Pt currently requires minimal assist for LB dressing and bathing while in seated position due to pain and limited AROM of L knee. Pt and spouse instructed in polar care mgt, falls prevention strategies, home/routines modifications, DME/AE for LB bathing and dressing tasks, and compression stocking mgt. Handout provided to support recall and carryover. Pt would benefit from skilled OT services including additional instruction in dressing techniques with or without assistive devices for dressing and bathing skills to support recall and carryover prior to discharge and ultimately to maximize safety, independence, and minimize falls risk and caregiver burden. Do not currently anticipate any OT needs following this hospitalization.      Follow Up Recommendations  No OT follow up    Equipment Recommendations  3 in 1 bedside commode    Recommendations for Other Services       Precautions / Restrictions Precautions Precautions: Fall;Knee Precaution Booklet Issued: Yes (comment) Restrictions Weight Bearing Restrictions: Yes LLE Weight Bearing: Weight bearing as tolerated      Mobility Bed Mobility     General bed mobility comments: NT up in recliner    Transfers Overall transfer level: Needs assistance Equipment used: Rolling walker (2 wheeled) Transfers: Sit to/from Stand Sit to Stand: Min guard         General transfer comment: bed elevated for ease. Cues for hand placement. Once standing, upright posture noted    Balance Overall balance assessment: Needs  assistance Sitting-balance support: Feet supported Sitting balance-Leahy Scale: Good     Standing balance support: Bilateral upper extremity supported Standing balance-Leahy Scale: Good                             ADL either performed or assessed with clinical judgement   ADL Overall ADL's : Needs assistance/impaired                                       General ADL Comments: Pt currently requires MIN A for LB ADL and MAX A for compression stocking mgt and polar care mgt. CGA for ADL transfers with RW. Pt's spouse able to assist as needed     Vision Patient Visual Report: No change from baseline       Perception     Praxis      Pertinent Vitals/Pain Pain Assessment: 0-10 Pain Score: 8  Pain Location: L knee Pain Descriptors / Indicators: Operative site guarding Pain Intervention(s): Limited activity within patient's tolerance;Monitored during session;Repositioned;Ice applied     Hand Dominance     Extremity/Trunk Assessment Upper Extremity Assessment Upper Extremity Assessment: Overall WFL for tasks assessed   Lower Extremity Assessment Lower Extremity Assessment: LLE deficits/detail LLE Deficits / Details: grossly 4/5       Communication Communication Communication: No difficulties   Cognition Arousal/Alertness: Awake/alert Behavior During Therapy: WFL for tasks assessed/performed Overall Cognitive Status: Within Functional Limits for tasks assessed  General Comments       Exercises  Other Exercises: Pt/spouse instructed in polar care, compression stockings, falls prevention, home/routines modifications, AE/DME; handout provided   Shoulder Instructions      Home Living Family/patient expects to be discharged to:: Private residence Living Arrangements: Spouse/significant other Available Help at Discharge: Family Type of Home: Apartment Home Access: Stairs to  enter Secretary/administrator of Steps: 5 Entrance Stairs-Rails: Right Home Layout: One level     Bathroom Shower/Tub: Tub/shower unit         Home Equipment: None          Prior Functioning/Environment Level of Independence: Independent        Comments: was previously independent without falls history        OT Problem List: Decreased strength;Pain;Decreased range of motion;Impaired balance (sitting and/or standing);Decreased knowledge of use of DME or AE      OT Treatment/Interventions: Self-care/ADL training;Therapeutic exercise;Therapeutic activities;DME and/or AE instruction;Patient/family education;Balance training    OT Goals(Current goals can be found in the care plan section) Acute Rehab OT Goals Patient Stated Goal: to go home OT Goal Formulation: With patient/family Time For Goal Achievement: 06/21/20 Potential to Achieve Goals: Good ADL Goals Pt Will Perform Lower Body Dressing: sit to/from stand;with modified independence Pt Will Transfer to Toilet: with modified independence;ambulating (elevated commode, LRAD for amb) Additional ADL Goal #1: Pt will independently instruct family in polar care mgt Additional ADL Goal #2: Pt will independently instruct family in compression stocking mgt  OT Frequency: Min 2X/week   Barriers to D/C:            Co-evaluation              AM-PAC OT "6 Clicks" Daily Activity     Outcome Measure Help from another person eating meals?: None Help from another person taking care of personal grooming?: None Help from another person toileting, which includes using toliet, bedpan, or urinal?: A Little Help from another person bathing (including washing, rinsing, drying)?: A Little Help from another person to put on and taking off regular upper body clothing?: None Help from another person to put on and taking off regular lower body clothing?: A Little 6 Click Score: 21   End of Session Nurse Communication: Patient  requests pain meds  Activity Tolerance: Patient tolerated treatment well Patient left: in chair;with call bell/phone within reach;with chair alarm set;with family/visitor present;with SCD's reapplied;Other (comment) (polar care in place)  OT Visit Diagnosis: Other abnormalities of gait and mobility (R26.89);Pain Pain - Right/Left: Left Pain - part of body: Knee                Time: 3220-2542 OT Time Calculation (min): 21 min Charges:  OT General Charges $OT Visit: 1 Visit OT Evaluation $OT Eval Low Complexity: 1 Low OT Treatments $Self Care/Home Management : 8-22 mins  Wynona Canes, MPH, MS, OTR/L ascom [phone] 06/07/20, 4:53 PM

## 2020-06-07 NOTE — Evaluation (Signed)
Physical Therapy Evaluation Patient Details Name: Ryan Schmidt MRN: [id number] DOB: [birth year] Today's Date: 06/07/2020   History of Present Illness  Pt admitted for L TKR. History includes HLD, HTN, and obesity.  Clinical Impression  Pt is a pleasant 65 year old male who was admitted for L TKR. Pt performs bed mobility, transfers, and ambulation with cga and RW. Pt demonstrates deficits with strength/ROM/mobility/pain. Pt demonstrates ability to perform 10 SLRs with independence, therefore does not require KI for mobility. Pt is very motivated to participate and eager to go home. Would benefit from skilled PT to address above deficits and promote optimal return to PLOF. Recommend transition to HHPT upon discharge from acute hospitalization.     Follow Up Recommendations Home health PT    Equipment Recommendations  Rolling walker with 5" wheels;3in1 (PT)    Recommendations for Other Services       Precautions / Restrictions Precautions Precautions: Fall;Knee Precaution Booklet Issued: No Restrictions Weight Bearing Restrictions: Yes LLE Weight Bearing: Weight bearing as tolerated      Mobility  Bed Mobility Overal bed mobility: Needs Assistance Bed Mobility: Supine to Sit     Supine to sit: Min guard     General bed mobility comments: able to perform bed mobility with safe technique. Follows commands well. Once seated, upright posture noted    Transfers Overall transfer level: Needs assistance Equipment used: Rolling walker (2 wheeled) Transfers: Sit to/from Stand Sit to Stand: Min guard         General transfer comment: bed elevated for ease. Cues for hand placement. Once standing, upright posture noted  Ambulation/Gait Ambulation/Gait assistance: Min guard Gait Distance (Feet): 40 Feet Assistive device: Rolling walker (2 wheeled) Gait Pattern/deviations: Step-to pattern     General Gait Details: able to ambulate in room with cues for keeping RW close to body  and sequencing of gait pattern. Antalgic pattern noted.  Stairs            Wheelchair Mobility    Modified Rankin (Stroke Patients Only)       Balance Overall balance assessment: Needs assistance Sitting-balance support: Feet supported Sitting balance-Leahy Scale: Good     Standing balance support: Bilateral upper extremity supported Standing balance-Leahy Scale: Good                               Pertinent Vitals/Pain Pain Assessment: 0-10 Pain Score: 2  Pain Location: L knee Pain Descriptors / Indicators: Operative site guarding Pain Intervention(s): Limited activity within patient's tolerance;Ice applied    Home Living Family/patient expects to be discharged to:: Private residence Living Arrangements: Spouse/significant other Available Help at Discharge: Family Type of Home: Apartment Home Access: Stairs to enter Entrance Stairs-Rails: Right Entrance Stairs-Number of Steps: 5 Home Layout: One level Home Equipment: None      Prior Function Level of Independence: Independent         Comments: was previously independent without falls history     Hand Dominance        Extremity/Trunk Assessment   Upper Extremity Assessment Upper Extremity Assessment: Overall WFL for tasks assessed    Lower Extremity Assessment Lower Extremity Assessment: Generalized weakness (L LE grossly 4/5)       Communication   Communication: No difficulties  Cognition Arousal/Alertness: Awake/alert Behavior During Therapy: WFL for tasks assessed/performed Overall Cognitive Status: Within Functional Limits for tasks assessed  General Comments      Exercises Total Joint Exercises Goniometric ROM: L knee 0-60 degrees   Assessment/Plan    PT Assessment Patient needs continued PT services  PT Problem List Decreased strength;Decreased range of motion;Decreased mobility;Decreased knowledge of use of  DME;Pain;Obesity       PT Treatment Interventions DME instruction;Gait training;Stair training;Therapeutic exercise    PT Goals (Current goals can be found in the Care Plan section)  Acute Rehab PT Goals Patient Stated Goal: to go home PT Goal Formulation: With patient Time For Goal Achievement: 06/21/20 Potential to Achieve Goals: Good    Frequency BID   Barriers to discharge        Co-evaluation               AM-PAC PT "6 Clicks" Mobility  Outcome Measure Help needed turning from your back to your side while in a flat bed without using bedrails?: A Little Help needed moving from lying on your back to sitting on the side of a flat bed without using bedrails?: A Little Help needed moving to and from a bed to a chair (including a wheelchair)?: A Little Help needed standing up from a chair using your arms (e.g., wheelchair or bedside chair)?: A Little Help needed to walk in hospital room?: A Little Help needed climbing 3-5 steps with a railing? : A Little 6 Click Score: 18    End of Session Equipment Utilized During Treatment: Gait belt Activity Tolerance: Patient tolerated treatment well Patient left: in chair;with chair alarm set;with family/visitor present;with SCD's reapplied Nurse Communication: Mobility status PT Visit Diagnosis: Muscle weakness (generalized) (M62.81);Difficulty in walking, not elsewhere classified (R26.2);Pain Pain - Right/Left: Left Pain - part of body: Knee    Time: 5916-3846 PT Time Calculation (min) (ACUTE ONLY): 24 min   Charges:   PT Evaluation $PT Eval Low Complexity: 1 Low PT Treatments $Gait Training: 8-22 mins        Ryan Schmidt, PT, DPT [phone]   Ryan Schmidt 06/07/2020, 4:15 PM

## 2020-06-07 NOTE — Op Note (Addendum)
06/07/2020  10:06 AM  PATIENT:  Ryan Schmidt   MRN: [id number]  PRE-OPERATIVE DIAGNOSIS:  Primary localized osteoarthritis of left knee   POST-OPERATIVE DIAGNOSIS:  Same   PROCEDURE:  Procedure(s): Left TOTAL KNEE ARTHROPLASTY   SURGEON: Leitha Schuller, MD   ASSISTANTS: Cranston Neighbor, PA-C   ANESTHESIA:   spinal   EBL:  100   BLOOD ADMINISTERED:none   DRAINS: Incisional wound VAC    LOCAL MEDICATIONS USED:  MARCAINE    and OTHER Exparel and morphine   SPECIMEN:  No Specimen   DISPOSITION OF SPECIMEN:  N/A   COUNTS:  YES   TOURNIQUET:   87 minutes at 300 mm Hg   IMPLANTS: Medacta  GMK sphere system with  6+ left femur, 5 tibia with short stem and  10 mm insert.  Size  3 patella, all components cemented.   DICTATION: Reubin Milan Dictation   patient was brought to the operating room and spinal anesthesia was obtained.  After prepping and draping the  left leg in sterile fashion, and after patient identification and timeout procedures were completed, tourniquet was raised  and skin incision was made using his prior medial parapatellar arthrotomy skin incision extended proximally and distally to allow for adequate exposure followed by medial parapatellar arthrotomy with  severe medial compartment osteoarthritis, severe patellofemoral arthritis and  severe lateral compartment arthritis,  with eburnated bone throughout the entire knee and a great deal of stiffness multiple loose bodies partial synovectomy was also carried out.   The ACL and PCL and fat pad were excised along with anterior horns of the meniscus. The proximal tibia cutting guide from  the [facility] system was applied and the proximal tibia cut carried out.  The distal femoral cut was carried out in a similar fashion     The  6+ femoral cutting guide applied with anterior posterior and chamfer cuts made.  The posterior horns of the menisci were removed at this point.   Injection of the above medication was carried out after the  femoral and tibial cuts were carried out.  The  5 baseplate trial was placed pinned into position and proximal tibial preparation carried out with drilling hand reaming and the keel punch followed by placement of the  6+ femur and sizing the tibial insert size   10 millimeter gave the best fit with stability and full extension.  The distal femoral drill holes were made in the notch cut for the trochlear groove was then carried out with trials were then removed the patella was cut using the patellar cutting guide and it sized to a size  3 after drill holes have been made  The knee was irrigated with pulsatile lavage and the bony surfaces dried the tibial component was cemented into place first.  Excess cement was removed and the polyethylene insert placed with a torque screw placed with a torque screwdriver tightened.  The distal femoral component was placed and the knee was held in extension as the patellar button was clamped into place.  After the cement was set, excess cement was removed and the knee was again irrigated thoroughly thoroughly irrigated.  The tourniquet was let down and hemostasis checked with electrocautery. The arthrotomy was repaired with a heavy Quill suture,  followed by 3-0 V lock subcuticular closure, skin staples followed by incisional wound VAC and Polar Care..  Prior to closure stimulant beads with vancomycin were placed in the joint because of the patient's history of prior osteomyelitis of the femur  related to an open fracture as a young man.  PLAN OF CARE: Admit to inpatient    PATIENT DISPOSITION:  PACU - hemodynamically stable.

## 2020-06-07 NOTE — Plan of Care (Signed)
  Problem: Education: Goal: Knowledge of General Education information will improve Description: Including pain rating scale, medication(s)/side effects and non-pharmacologic comfort measures Outcome: Progressing   Problem: Health Behavior/Discharge Planning: Goal: Ability to manage health-related needs will improve Outcome: Progressing   Problem: Clinical Measurements: Goal: Ability to maintain clinical measurements within normal limits will improve Outcome: Progressing   Problem: Clinical Measurements: Goal: Will remain free from infection Outcome: Progressing   Problem: Clinical Measurements: Goal: Diagnostic test results will improve Outcome: Progressing   Problem: Clinical Measurements: Goal: Respiratory complications will improve Outcome: Progressing   Problem: Activity: Goal: Risk for activity intolerance will decrease Outcome: Progressing   Problem: Activity: Goal: Risk for activity intolerance will decrease Outcome: Progressing   Problem: Nutrition: Goal: Adequate nutrition will be maintained Outcome: Progressing   Problem: Coping: Goal: Level of anxiety will decrease Outcome: Progressing   Problem: Elimination: Goal: Will not experience complications related to bowel motility Outcome: Progressing   Problem: Elimination: Goal: Will not experience complications related to urinary retention Outcome: Progressing   Problem: Elimination: Goal: Will not experience complications related to urinary retention Outcome: Progressing   Problem: Pain Managment: Goal: General experience of comfort will improve Outcome: Progressing   Problem: Safety: Problem: Skin Integrity: Goal: Risk for impaired skin integrity will decrease Outcome: Progressing   Goal: Ability to remain free from injury will improve Outcome: Progressing   Problem: Safety: Goal: Ability to remain free from injury will improve Outcome: Progressing
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
## 2020-06-07 NOTE — Anesthesia Preprocedure Evaluation (Signed)
Anesthesia Evaluation  Patient identified by MRN, date of birth, ID band Patient awake    Reviewed: Allergy & Precautions, H&P , NPO status , Patient's Chart, lab work & pertinent test results, reviewed documented beta blocker date and time   Airway Mallampati: III   Neck ROM: full    Dental  (+) Teeth Intact   Pulmonary neg pulmonary ROS, former smoker,    Pulmonary exam normal        Cardiovascular Exercise Tolerance: Good hypertension, On Medications negative cardio ROS Normal cardiovascular exam Rhythm:regular Rate:Normal     Neuro/Psych  Neuromuscular disease negative psych ROS   GI/Hepatic negative GI ROS, Neg liver ROS,   Endo/Other  negative endocrine ROS  Renal/GU negative Renal ROS  negative genitourinary   Musculoskeletal   Abdominal   Peds  Hematology negative hematology ROS (+)   Anesthesia Other Findings Past Medical History: No date: Benign prostatic hyperplasia No date: Hypercholesteremia No date: Hypertension No date: Mitral valve insufficiency No date: Neuromuscular disorder (HCC) Past Surgical History: No date: APPENDECTOMY No date: GLAUCOMA SURGERY; Bilateral No date: LEG SURGERY; Left     Comment:  at age 65 BMI    Body Mass Index: 38.77 kg/m     Reproductive/Obstetrics negative OB ROS                             Anesthesia Physical Anesthesia Plan  ASA: III  Anesthesia Plan: Spinal   Post-op Pain Management:    Induction:   PONV Risk Score and Plan: 2  Airway Management Planned:   Additional Equipment:   Intra-op Plan:   Post-operative Plan:   Informed Consent: I have reviewed the patients History and Physical, chart, labs and discussed the procedure including the risks, benefits and alternatives for the proposed anesthesia with the patient or authorized representative who has indicated his/her understanding and acceptance.     Dental  Advisory Given  Plan Discussed with: CRNA  Anesthesia Plan Comments:         Anesthesia Quick Evaluation

## 2020-06-07 NOTE — TOC Progression Note (Signed)
Transition of Care [facility]) - Progression Note    Patient Details  Name: Ryan Schmidt MRN: [id number] Date of Birth: 09/23/1955  [facility]) CM/SW Contact  Caryn Section, RN Phone Number: 06/07/2020, 2:48 PM  Clinical Narrative:   Patient lives at home with his wife, who can support him after discharge if/as necessary.  He has no concerns about getting to appointments or getting/taking medications.  Patient has no concerns about going home after discharge.  He does not have home health currently, but is amenable to home health at discharge.    Awaiting PT notes for recommendations, TOC contact information given, TOC to follow to discharge.         Expected Discharge Plan and Services                                                 Social Determinants of Health (SDOH) Interventions    Readmission Risk Interventions No flowsheet data found.

## 2020-06-07 NOTE — Transfer of Care (Signed)
Immediate Anesthesia Transfer of Care Note  Patient: Ryan Schmidt  Procedure(s) Performed: TOTAL KNEE ARTHROPLASTY - [place] Neighbor to Assist (Left Knee)  Patient Location: PACU  Anesthesia Type:General  Level of Consciousness: awake, alert  and oriented  Airway & Oxygen Therapy: Patient Spontanous Breathing  Post-op Assessment: Report given to RN and Post -op Vital signs reviewed and stable  Post vital signs: Reviewed and stable  Last Vitals:  Vitals Value Taken Time  BP    Temp    Pulse    Resp    SpO2      Last Pain:  Vitals:   06/07/20 0624  TempSrc: Temporal  PainSc: 0-No pain      Patients Stated Pain Goal: 0 (06/07/20 0076)  Complications: No complications documented.

## 2020-06-08 LAB — CBC
HCT: 35.7 % — ABNORMAL LOW (ref 39.0–52.0)
Hemoglobin: 12.5 g/dL — ABNORMAL LOW (ref 13.0–17.0)
MCH: 33.1 pg (ref 26.0–34.0)
MCHC: 35 g/dL (ref 30.0–36.0)
MCV: 94.4 fL (ref 80.0–100.0)
Platelets: 195 10*3/uL (ref 150–400)
RBC: 3.78 MIL/uL — ABNORMAL LOW (ref 4.22–5.81)
RDW: 12.5 % (ref 11.5–15.5)
WBC: 17.9 10*3/uL — ABNORMAL HIGH (ref 4.0–10.5)
nRBC: 0 % (ref 0.0–0.2)

## 2020-06-08 LAB — BASIC METABOLIC PANEL
Anion gap: 7 (ref 5–15)
BUN: 15 mg/dL (ref 8–23)
CO2: 28 mmol/L (ref 22–32)
Calcium: 8.8 mg/dL — ABNORMAL LOW (ref 8.9–10.3)
Chloride: 100 mmol/L (ref 98–111)
Creatinine, Ser: 0.86 mg/dL (ref 0.61–1.24)
GFR, Estimated: >60 mL/min (ref >60)
Glucose, Bld: 188 mg/dL — ABNORMAL HIGH (ref 70–99)
Potassium: 5.5 mmol/L — ABNORMAL HIGH (ref 3.5–5.1)
Sodium: 135 mmol/L (ref 135–145)

## 2020-06-08 MED ORDER — METHOCARBAMOL 500 MG PO TABS: 500.0000 mg | ORAL_TABLET | Freq: Four times a day (QID) | ORAL | 0 refills | Status: DC | PRN

## 2020-06-08 MED ORDER — ENOXAPARIN SODIUM 40 MG/0.4ML IJ SOSY: 40.0000 mg | PREFILLED_SYRINGE | INTRAMUSCULAR | 0 refills | Status: DC

## 2020-06-08 MED ORDER — TRAMADOL HCL 50 MG PO TABS: 50.0000 mg | ORAL_TABLET | Freq: Four times a day (QID) | ORAL | 0 refills | Status: DC

## 2020-06-08 MED ORDER — OXYCODONE HCL 5 MG PO TABS: 5.0000 mg | ORAL_TABLET | ORAL | 0 refills | Status: DC | PRN

## 2020-06-08 NOTE — Anesthesia Postprocedure Evaluation (Signed)
Anesthesia Post Note  Patient: Ryan Schmidt  Procedure(s) Performed: TOTAL KNEE ARTHROPLASTY - [place] Neighbor to Assist (Left Knee)  Patient location during evaluation: Nursing Unit Anesthesia Type: Spinal Level of consciousness: oriented and awake and alert Pain management: pain level controlled Vital Signs Assessment: post-procedure vital signs reviewed and stable Respiratory status: spontaneous breathing and respiratory function stable Cardiovascular status: blood pressure returned to baseline and stable Postop Assessment: no headache, no backache, no apparent nausea or vomiting and patient able to bend at knees Anesthetic complications: no   No complications documented.   Last Vitals:  Vitals:   06/07/20 2002 06/08/20 0533  BP: 125/86 118/81  Pulse: 80 91  Resp: 18 16  Temp: 36.7 C 36.6 C  SpO2: 100% 93%    Last Pain:  Vitals:   06/08/20 0533  TempSrc: Oral  PainSc:                  Jeanine Luz

## 2020-06-08 NOTE — TOC Progression Note (Signed)
Transition of Care (TOC) - Progression Note    Patient Details  Name: Ryan Schmidt MRN: [id number] Date of Birth: 02/07/1955  Transition of Care (TOC) CM/SW Contact  Deliliah J Gregory, RN Phone Number: 06/08/2020, 12:25 PM  Clinical Narrative:    Met with the patient in the room to discuss DC plan and needs., He needs a RW and a 3 in 1, it was brought to the room by [employer] for the patient to take home, He is set up with [facility] for HH services, he has transportation to the doctor and can afford his medications        Expected Discharge Plan and Services                                                 Social Determinants of Health (SDOH) Interventions    Readmission Risk Interventions No flowsheet data found.  

## 2020-06-08 NOTE — Progress Notes (Signed)
   Subjective: 1 Day Post-Op Procedure(s) (LRB): TOTAL KNEE ARTHROPLASTY - Ryan Schmidt to Assist (Left) Patient reports pain as mild.   Patient is well, and has had no acute complaints or problems Denies any CP, SOB, ABD pain. We will continue therapy today.  Plan is to go Home after hospital stay.  Objective: Vital signs in last 24 hours: Temp:  [97 F (36.1 C)-98.4 F (36.9 C)] 97.8 F (36.6 C) (06/03 0533) Pulse Rate:  [65-96] 91 (06/03 0533) Resp:  [16-22] 16 (06/03 0533) BP: (97-126)/(49-86) 118/81 (06/03 0533) SpO2:  [93 %-100 %] 93 % (06/03 0533)  Intake/Output from previous day: 06/02 0701 - 06/03 0700 In: 2540.5 [P.O.:480; I.V.:2060.5] Out: 5700 [Urine:5600; Blood:100] Intake/Output this shift: No intake/output data recorded.  Recent Labs    06/07/20 1319 06/08/20 0449  HGB 13.5 12.5*   Recent Labs    06/07/20 1319 06/08/20 0449  WBC 11.2* 17.9*  RBC 4.03* 3.78*  HCT 38.8* 35.7*  PLT 187 195   Recent Labs    06/07/20 1319 06/08/20 0449  NA  --  135  K  --  5.5*  CL  --  100  CO2  --  28  BUN  --  15  CREATININE 1.03 0.86  GLUCOSE  --  188*  CALCIUM  --  8.8*   No results for input(s): LABPT, INR in the last 72 hours.  EXAM General - Patient is Alert, Appropriate and Oriented Extremity - Neurovascular intact Sensation intact distally Intact pulses distally Dorsiflexion/Plantar flexion intact No cellulitis present Compartment soft Dressing - dressing C/D/I and no drainage, Praveena intact without drainage Motor Function - intact, moving foot and toes well on exam.   Past Medical History:  Diagnosis Date  . Benign prostatic hyperplasia   . Hypercholesteremia   . Hypertension   . Mitral valve insufficiency   . Neuromuscular disorder (HCC)     Assessment/Plan:   1 Day Post-Op Procedure(s) (LRB): TOTAL KNEE ARTHROPLASTY - Ryan Schmidt to Assist (Left) Active Problems:   S/P TKR (total knee replacement) using cement,  left  Estimated body mass index is 38.77 kg/m as calculated from the following:   Height as of this encounter: 5\' 9"  (1.753 m).   Weight as of this encounter: 119.1 kg. Advance diet Up with therapy  Labs and vital signs are stable.  Pain well controlled. Bowel movement Care management to assist with discharge to home with home health PT.  Possible discharge today if completion of PT goals  DVT Prophylaxis - Lovenox, TED hose and SCDs Weight-Bearing as tolerated to left leg   T. , PA-C [facility] 06/08/2020, 7:41 AM

## 2020-06-08 NOTE — Progress Notes (Signed)
Discharge instructions reviewed with pt. Pt verbalized understanding of instructions. Prescriptions and discharge instructions given to pt.

## 2020-06-08 NOTE — Progress Notes (Signed)
OT Cancellation Note  Patient Details Name: Ryan Schmidt MRN: [id number] DOB: 01/15/1955   Cancelled Treatment:    Reason Eval/Treat Not Completed: Patient declined, no reason specified. Pt will excellent recall of previously learned education. Denies additional needs. Eager to return home. Will re-attempt as appropriate should he not leave today.  Wynona Canes, MPH, MS, OTR/L ascom [phone] 06/08/20, 12:37 PM

## 2020-06-08 NOTE — Discharge Instructions (Signed)

## 2020-06-08 NOTE — Progress Notes (Signed)
Physical Therapy Treatment Patient Details Name: Ryan Schmidt MRN: [id number] DOB: [birth year] Today's Date: 06/08/2020    History of Present Illness Pt admitted for L TKR. History includes HLD, HTN, and obesity.    PT Comments    Pt was sitting in recliner upon arriving. He agrees to PT session and is cooperative and pleasant throughout. Easily able to stand and ambulate with RW > 600 ft. No LOB or unsteadiness noted. Performed ascending/descending step without rails with supervision. Overall pt is progressing quickly. Recommend DC home with HHPT when cleared by MD.  Will need 3 in one and RW    Follow Up Recommendations  Home health PT     Equipment Recommendations  Rolling walker with 5" wheels;3in1 (PT)       Precautions / Restrictions Precautions Precautions: Fall;Knee Precaution Booklet Issued: Yes (comment) Restrictions Weight Bearing Restrictions: Yes LLE Weight Bearing: Weight bearing as tolerated    Mobility  Bed Mobility      General bed mobility comments: in recliner pre/post session    Transfers Overall transfer level: Needs assistance Equipment used: Rolling walker (2 wheeled) Transfers: Sit to/from Stand Sit to Stand: Supervision         General transfer comment: no physical assistance required  Ambulation/Gait Ambulation/Gait assistance: Supervision Gait Distance (Feet): 600 Feet Assistive device: Rolling walker (2 wheeled) Gait Pattern/deviations: Step-through pattern Gait velocity: WNL   General Gait Details: Pt was easily able to ambulate 3 laps in hallway without LOB or safety concern   Stairs Stairs: Yes Stairs assistance: Supervision Stair Management: Step to pattern;No rails;Backwards Number of Stairs: 3 General stair comments: Pt was able to ambulate up/down step 3 x      Balance Overall balance assessment: Modified Independent      Cognition Arousal/Alertness: Awake/alert Behavior During Therapy: WFL for tasks  assessed/performed Overall Cognitive Status: Within Functional Limits for tasks assessed      General Comments: A and O x 4. cooperative and pleasant throughout             Pertinent Vitals/Pain Pain Assessment: 0-10 Pain Score: 8  Pain Location: L knee Pain Descriptors / Indicators: Operative site guarding Pain Intervention(s): Limited activity within patient's tolerance;Monitored during session;Premedicated before session;Repositioned;Ice applied           PT Goals (current goals can now be found in the care plan section) Acute Rehab PT Goals Patient Stated Goal: to go home Progress towards PT goals: Progressing toward goals    Frequency    BID      PT Plan Current plan remains appropriate       AM-PAC PT "6 Clicks" Mobility   Outcome Measure  Help needed turning from your back to your side while in a flat bed without using bedrails?: None Help needed moving from lying on your back to sitting on the side of a flat bed without using bedrails?: None Help needed moving to and from a bed to a chair (including a wheelchair)?: None Help needed standing up from a chair using your arms (e.g., wheelchair or bedside chair)?: None Help needed to walk in hospital room?: None Help needed climbing 3-5 steps with a railing? : None 6 Click Score: 24    End of Session   Activity Tolerance: Patient tolerated treatment well Patient left: in chair;with chair alarm set;with family/visitor present;with SCD's reapplied Nurse Communication: Mobility status PT Visit Diagnosis: Muscle weakness (generalized) (M62.81);Difficulty in walking, not elsewhere classified (R26.2);Pain Pain - Right/Left: Left Pain - part of  body: Knee     Time: 5809-9833 PT Time Calculation (min) (ACUTE ONLY): 23 min  Charges:  $Gait Training: 23-37 mins                     Jetta Lout PTA 06/08/20, 10:30 AM

## 2020-06-08 NOTE — Discharge Summary (Signed)
Physician Discharge Summary  Patient ID: Ryan Schmidt MRN: [id number] DOB/AGE: 1955-09-10 65 y.o.  Admit date: 06/07/2020 Discharge date: 06/08/2020  Admission Diagnoses:  S/P TKR (total knee replacement) using cement, left [Z96.652]   Discharge Diagnoses: Patient Active Problem List   Diagnosis Date Noted  . S/P TKR (total knee replacement) using cement, left 06/07/2020  . Vitamin D deficiency 02/09/2019  . Erectile dysfunction 11/05/2018  . Mixed hyperlipidemia 01/20/2018  . Neuromuscular disorder (HCC) 12/16/2017  . Essential hypertension 12/16/2017  . BPH (benign prostatic hyperplasia) 12/16/2017  . Obesity 12/16/2017    Past Medical History:  Diagnosis Date  . Benign prostatic hyperplasia   . Hypercholesteremia   . Hypertension   . Mitral valve insufficiency   . Neuromuscular disorder (HCC)      Transfusion: None   Consultants (if any):   Discharged Condition: Improved  Hospital Course: Ryan Schmidt is an 65 y.o. male who was admitted 06/07/2020 with a diagnosis of left knee osteoarthritis, posttraumatic and went to the operating room on 06/07/2020 and underwent the above named procedures.    Surgeries: Procedure(s): TOTAL KNEE ARTHROPLASTY - [place] Neighbor to Assist on 06/07/2020 Patient tolerated the surgery well. Taken to PACU where she was stabilized and then transferred to the orthopedic floor.  Started on Lovenox 30 mg q 12 hrs. Foot pumps applied bilaterally at 80 mm. Heels elevated on bed with rolled towels. No evidence of DVT. Negative Homan. Physical therapy started on day #1 for gait training and transfer. OT started day #1 for ADL and assisted devices.  Patient's foley was d/c on day #1. Patient's IV was d/c on day #1.  On post op day #1 patient was stable and ready for discharge to home with home health PT.  Implants: Medacta GMK sphere system with 6+ leftfemur, 5tibia with short stem and 46mm insert. Size 3patella, all components cemented.  He  was given perioperative antibiotics:  Anti-infectives (From admission, onward)   Start     Dose/Rate Route Frequency Ordered Stop   06/07/20 1400  ceFAZolin (ANCEF) IVPB 2g/100 mL premix        2 g 200 mL/hr over 30 Minutes Intravenous Every 6 hours 06/07/20 1248 06/07/20 2317   06/07/20 0623  ceFAZolin (ANCEF) 2-4 GM/100ML-% IVPB       Note to Pharmacy: Kerman Passey, Cryst: cabinet override      06/07/20 0623 06/07/20 0750   06/07/20 0600  ceFAZolin (ANCEF) IVPB 2g/100 mL premix        2 g 200 mL/hr over 30 Minutes Intravenous On call to O.R. 06/07/20 0245 06/07/20 0750    .  He was given sequential compression devices, early ambulation, and Lovenox, teds for DVT prophylaxis.  He benefited maximally from the hospital stay and there were no complications.    Recent vital signs:  Vitals:   06/07/20 2002 06/08/20 0533  BP: 125/86 118/81  Pulse: 80 91  Resp: 18 16  Temp: 98.1 F (36.7 C) 97.8 F (36.6 C)  SpO2: 100% 93%    Recent laboratory studies:  Lab Results  Component Value Date   HGB 12.5 (L) 06/08/2020   HGB 13.5 06/07/2020   HGB 13.6 05/29/2020   Lab Results  Component Value Date   WBC 17.9 (H) 06/08/2020   PLT 195 06/08/2020   No results found for: INR Lab Results  Component Value Date   NA 135 06/08/2020   K 5.5 (H) 06/08/2020   CL 100 06/08/2020   CO2 28 06/08/2020  BUN 15 06/08/2020   CREATININE 0.86 06/08/2020   GLUCOSE 188 (H) 06/08/2020    Discharge Medications:   Allergies as of 06/08/2020      Reactions   Ampicillin Other (See Comments)   Hyperventilation   Morphine And Related Itching   Penicillins Other (See Comments)   Jittery Reaction: 15 years ago   Valium [diazepam] Other (See Comments)   Nervous, jittery      Medication List    STOP taking these medications   atorvastatin 40 MG tablet Commonly known as: LIPITOR     TAKE these medications   acetaminophen 500 MG tablet Commonly known as: TYLENOL Take 1,000 mg by mouth  every 6 (six) hours as needed for mild pain or moderate pain.   baclofen 10 MG tablet Commonly known as: LIORESAL TAKE 1 TABLET BY MOUTH 3  TIMES DAILY   enoxaparin 40 MG/0.4ML injection Commonly known as: LOVENOX Inject 0.4 mLs (40 mg total) into the skin daily for 14 days.   hydrochlorothiazide 25 MG tablet Commonly known as: HYDRODIURIL TAKE 1 TABLET BY MOUTH  DAILY   hydrOXYzine 10 MG tablet Commonly known as: ATARAX/VISTARIL Take 1 tablet (10 mg total) by mouth 3 (three) times daily as needed.   methocarbamol 500 MG tablet Commonly known as: ROBAXIN Take 1 tablet (500 mg total) by mouth every 6 (six) hours as needed for muscle spasms.   oxyCODONE 5 MG immediate release tablet Commonly known as: Oxy IR/ROXICODONE Take 1-2 tablets (5-10 mg total) by mouth every 4 (four) hours as needed for moderate pain (pain score 4-6).   predniSONE 10 MG tablet Commonly known as: DELTASONE 6 tabs today and tomorrow, 5 tabs the next 2 days, decrease by 1 every other day until gone.   tamsulosin 0.4 MG Caps capsule Commonly known as: FLOMAX TAKE 1 CAPSULE BY MOUTH  DAILY AFTER SUPPER What changed: See the new instructions.   traMADol 50 MG tablet Commonly known as: ULTRAM Take 1 tablet (50 mg total) by mouth every 6 (six) hours.            Durable Medical Equipment  (From admission, onward)         Start     Ordered   06/07/20 1249  DME Walker rolling  Once       Question Answer Comment  Walker: With 5 Inch Wheels   Patient needs a walker to treat with the following condition S/P TKR (total knee replacement) using cement, left      06/07/20 1248   06/07/20 1249  DME 3 n 1  Once        06/07/20 1248   06/07/20 1249  DME Bedside commode  Once       Question:  Patient needs a bedside commode to treat with the following condition  Answer:  S/P TKR (total knee replacement) using cement, left   06/07/20 1248          Diagnostic Studies: DG Knee 1-2 Views Left  Result  Date: 06/07/2020 CLINICAL DATA:  Status post left knee arthroplasty today. EXAM: LEFT KNEE - 1-2 VIEW COMPARISON:  None. 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 No fracture is identified. Rounded dense structures about the distal femur may be segment. There is a small joint effusion. No acute or focal bony abnormality. IMPRESSION: Status post left knee arthroplasty. Negative for fracture or other acute abnormality. Rounded high-density structures about the distal femur are indeterminate but may be cement. Electronically Signed  By: Drusilla Kanner M.D.   On: 06/07/2020 11:08    Disposition:      Follow-up Information    Evon Slack, PA-C Follow up in 2 week(s).   Specialties: Orthopedic Surgery, Emergency Medicine Contact information: [address] [place] [address] [phone]                Signed: [name] 06/08/2020, 7:47 AM

## 2020-06-09 DIAGNOSIS — Z471 Aftercare following joint replacement surgery: Secondary | ICD-10-CM | POA: Diagnosis not present

## 2020-06-09 DIAGNOSIS — G709 Myoneural disorder, unspecified: Secondary | ICD-10-CM | POA: Diagnosis not present

## 2020-06-09 DIAGNOSIS — M1652 Unilateral post-traumatic osteoarthritis, left hip: Secondary | ICD-10-CM | POA: Diagnosis not present

## 2020-06-09 DIAGNOSIS — E782 Mixed hyperlipidemia: Secondary | ICD-10-CM | POA: Diagnosis not present

## 2020-06-09 DIAGNOSIS — E559 Vitamin D deficiency, unspecified: Secondary | ICD-10-CM | POA: Diagnosis not present

## 2020-06-09 DIAGNOSIS — Z7901 Long term (current) use of anticoagulants: Secondary | ICD-10-CM | POA: Diagnosis not present

## 2020-06-09 DIAGNOSIS — Z96652 Presence of left artificial knee joint: Secondary | ICD-10-CM | POA: Diagnosis not present

## 2020-06-09 DIAGNOSIS — I10 Essential (primary) hypertension: Secondary | ICD-10-CM | POA: Diagnosis not present

## 2020-06-09 DIAGNOSIS — I051 Rheumatic mitral insufficiency: Secondary | ICD-10-CM | POA: Diagnosis not present

## 2020-06-09 LAB — TYPE AND SCREEN
ABO/RH(D): O POS
Antibody Screen: POSITIVE
PT AG Type: POSITIVE
Unit division: 0
Unit division: 0

## 2020-06-09 LAB — BPAM RBC
Blood Product Expiration Date: 202206222359
Blood Product Expiration Date: 202206222359
Unit Type and Rh: 9500
Unit Type and Rh: 9500

## 2020-06-11 DIAGNOSIS — E559 Vitamin D deficiency, unspecified: Secondary | ICD-10-CM | POA: Diagnosis not present

## 2020-06-11 DIAGNOSIS — E782 Mixed hyperlipidemia: Secondary | ICD-10-CM | POA: Diagnosis not present

## 2020-06-11 DIAGNOSIS — Z96652 Presence of left artificial knee joint: Secondary | ICD-10-CM | POA: Diagnosis not present

## 2020-06-11 DIAGNOSIS — Z471 Aftercare following joint replacement surgery: Secondary | ICD-10-CM | POA: Diagnosis not present

## 2020-06-11 DIAGNOSIS — G709 Myoneural disorder, unspecified: Secondary | ICD-10-CM | POA: Diagnosis not present

## 2020-06-11 DIAGNOSIS — M1652 Unilateral post-traumatic osteoarthritis, left hip: Secondary | ICD-10-CM | POA: Diagnosis not present

## 2020-06-11 DIAGNOSIS — Z7901 Long term (current) use of anticoagulants: Secondary | ICD-10-CM | POA: Diagnosis not present

## 2020-06-11 DIAGNOSIS — I051 Rheumatic mitral insufficiency: Secondary | ICD-10-CM | POA: Diagnosis not present

## 2020-06-11 DIAGNOSIS — I10 Essential (primary) hypertension: Secondary | ICD-10-CM | POA: Diagnosis not present

## 2020-06-13 DIAGNOSIS — Z96652 Presence of left artificial knee joint: Secondary | ICD-10-CM | POA: Diagnosis not present

## 2020-06-13 DIAGNOSIS — I051 Rheumatic mitral insufficiency: Secondary | ICD-10-CM | POA: Diagnosis not present

## 2020-06-13 DIAGNOSIS — Z7901 Long term (current) use of anticoagulants: Secondary | ICD-10-CM | POA: Diagnosis not present

## 2020-06-13 DIAGNOSIS — E782 Mixed hyperlipidemia: Secondary | ICD-10-CM | POA: Diagnosis not present

## 2020-06-13 DIAGNOSIS — Z471 Aftercare following joint replacement surgery: Secondary | ICD-10-CM | POA: Diagnosis not present

## 2020-06-13 DIAGNOSIS — E559 Vitamin D deficiency, unspecified: Secondary | ICD-10-CM | POA: Diagnosis not present

## 2020-06-13 DIAGNOSIS — G709 Myoneural disorder, unspecified: Secondary | ICD-10-CM | POA: Diagnosis not present

## 2020-06-13 DIAGNOSIS — I10 Essential (primary) hypertension: Secondary | ICD-10-CM | POA: Diagnosis not present

## 2020-06-13 DIAGNOSIS — M1652 Unilateral post-traumatic osteoarthritis, left hip: Secondary | ICD-10-CM | POA: Diagnosis not present

## 2020-06-15 DIAGNOSIS — Z7901 Long term (current) use of anticoagulants: Secondary | ICD-10-CM | POA: Diagnosis not present

## 2020-06-15 DIAGNOSIS — I051 Rheumatic mitral insufficiency: Secondary | ICD-10-CM | POA: Diagnosis not present

## 2020-06-15 DIAGNOSIS — I10 Essential (primary) hypertension: Secondary | ICD-10-CM | POA: Diagnosis not present

## 2020-06-15 DIAGNOSIS — Z96652 Presence of left artificial knee joint: Secondary | ICD-10-CM | POA: Diagnosis not present

## 2020-06-15 DIAGNOSIS — M1652 Unilateral post-traumatic osteoarthritis, left hip: Secondary | ICD-10-CM | POA: Diagnosis not present

## 2020-06-15 DIAGNOSIS — G709 Myoneural disorder, unspecified: Secondary | ICD-10-CM | POA: Diagnosis not present

## 2020-06-15 DIAGNOSIS — Z471 Aftercare following joint replacement surgery: Secondary | ICD-10-CM | POA: Diagnosis not present

## 2020-06-15 DIAGNOSIS — E559 Vitamin D deficiency, unspecified: Secondary | ICD-10-CM | POA: Diagnosis not present

## 2020-06-15 DIAGNOSIS — E782 Mixed hyperlipidemia: Secondary | ICD-10-CM | POA: Diagnosis not present

## 2020-06-18 DIAGNOSIS — E782 Mixed hyperlipidemia: Secondary | ICD-10-CM | POA: Diagnosis not present

## 2020-06-18 DIAGNOSIS — Z471 Aftercare following joint replacement surgery: Secondary | ICD-10-CM | POA: Diagnosis not present

## 2020-06-18 DIAGNOSIS — G709 Myoneural disorder, unspecified: Secondary | ICD-10-CM | POA: Diagnosis not present

## 2020-06-18 DIAGNOSIS — I10 Essential (primary) hypertension: Secondary | ICD-10-CM | POA: Diagnosis not present

## 2020-06-18 DIAGNOSIS — I051 Rheumatic mitral insufficiency: Secondary | ICD-10-CM | POA: Diagnosis not present

## 2020-06-18 DIAGNOSIS — Z7901 Long term (current) use of anticoagulants: Secondary | ICD-10-CM | POA: Diagnosis not present

## 2020-06-18 DIAGNOSIS — E559 Vitamin D deficiency, unspecified: Secondary | ICD-10-CM | POA: Diagnosis not present

## 2020-06-18 DIAGNOSIS — Z96652 Presence of left artificial knee joint: Secondary | ICD-10-CM | POA: Diagnosis not present

## 2020-06-18 DIAGNOSIS — M1652 Unilateral post-traumatic osteoarthritis, left hip: Secondary | ICD-10-CM | POA: Diagnosis not present

## 2020-06-20 DIAGNOSIS — Z471 Aftercare following joint replacement surgery: Secondary | ICD-10-CM | POA: Diagnosis not present

## 2020-06-20 DIAGNOSIS — I10 Essential (primary) hypertension: Secondary | ICD-10-CM | POA: Diagnosis not present

## 2020-06-20 DIAGNOSIS — Z7901 Long term (current) use of anticoagulants: Secondary | ICD-10-CM | POA: Diagnosis not present

## 2020-06-20 DIAGNOSIS — E559 Vitamin D deficiency, unspecified: Secondary | ICD-10-CM | POA: Diagnosis not present

## 2020-06-20 DIAGNOSIS — I051 Rheumatic mitral insufficiency: Secondary | ICD-10-CM | POA: Diagnosis not present

## 2020-06-20 DIAGNOSIS — Z96652 Presence of left artificial knee joint: Secondary | ICD-10-CM | POA: Diagnosis not present

## 2020-06-20 DIAGNOSIS — G709 Myoneural disorder, unspecified: Secondary | ICD-10-CM | POA: Diagnosis not present

## 2020-06-20 DIAGNOSIS — M1652 Unilateral post-traumatic osteoarthritis, left hip: Secondary | ICD-10-CM | POA: Diagnosis not present

## 2020-06-20 DIAGNOSIS — E782 Mixed hyperlipidemia: Secondary | ICD-10-CM | POA: Diagnosis not present

## 2020-06-21 DIAGNOSIS — M6281 Muscle weakness (generalized): Secondary | ICD-10-CM | POA: Diagnosis not present

## 2020-06-21 DIAGNOSIS — Z96652 Presence of left artificial knee joint: Secondary | ICD-10-CM | POA: Diagnosis not present

## 2020-06-21 DIAGNOSIS — M25562 Pain in left knee: Secondary | ICD-10-CM | POA: Diagnosis not present

## 2020-06-21 DIAGNOSIS — M25662 Stiffness of left knee, not elsewhere classified: Secondary | ICD-10-CM | POA: Diagnosis not present

## 2020-06-25 DIAGNOSIS — Z96652 Presence of left artificial knee joint: Secondary | ICD-10-CM | POA: Diagnosis not present

## 2020-06-26 DIAGNOSIS — Z96652 Presence of left artificial knee joint: Secondary | ICD-10-CM | POA: Diagnosis not present

## 2020-06-29 DIAGNOSIS — Z96652 Presence of left artificial knee joint: Secondary | ICD-10-CM | POA: Diagnosis not present

## 2020-06-29 DIAGNOSIS — M25562 Pain in left knee: Secondary | ICD-10-CM | POA: Diagnosis not present

## 2020-07-02 ENCOUNTER — Other Ambulatory Visit: Payer: Self-pay | Admitting: Nurse Practitioner

## 2020-07-03 DIAGNOSIS — R600 Localized edema: Secondary | ICD-10-CM | POA: Diagnosis not present

## 2020-07-04 DIAGNOSIS — Z96652 Presence of left artificial knee joint: Secondary | ICD-10-CM | POA: Diagnosis not present

## 2020-07-10 DIAGNOSIS — Z96652 Presence of left artificial knee joint: Secondary | ICD-10-CM | POA: Diagnosis not present

## 2020-07-10 DIAGNOSIS — M25562 Pain in left knee: Secondary | ICD-10-CM | POA: Diagnosis not present

## 2020-07-12 DIAGNOSIS — M25562 Pain in left knee: Secondary | ICD-10-CM | POA: Diagnosis not present

## 2020-07-12 DIAGNOSIS — Z96652 Presence of left artificial knee joint: Secondary | ICD-10-CM | POA: Diagnosis not present

## 2020-07-16 DIAGNOSIS — Z96652 Presence of left artificial knee joint: Secondary | ICD-10-CM | POA: Diagnosis not present

## 2020-07-16 DIAGNOSIS — M25562 Pain in left knee: Secondary | ICD-10-CM | POA: Diagnosis not present

## 2020-07-20 DIAGNOSIS — Z96652 Presence of left artificial knee joint: Secondary | ICD-10-CM | POA: Diagnosis not present

## 2020-07-20 DIAGNOSIS — M1732 Unilateral post-traumatic osteoarthritis, left knee: Secondary | ICD-10-CM | POA: Diagnosis not present

## 2020-07-23 DIAGNOSIS — Z96652 Presence of left artificial knee joint: Secondary | ICD-10-CM | POA: Diagnosis not present

## 2020-07-27 DIAGNOSIS — Z96652 Presence of left artificial knee joint: Secondary | ICD-10-CM | POA: Diagnosis not present

## 2020-07-27 DIAGNOSIS — M25562 Pain in left knee: Secondary | ICD-10-CM | POA: Diagnosis not present

## 2020-07-30 DIAGNOSIS — M25562 Pain in left knee: Secondary | ICD-10-CM | POA: Diagnosis not present

## 2020-07-30 DIAGNOSIS — Z96652 Presence of left artificial knee joint: Secondary | ICD-10-CM | POA: Diagnosis not present

## 2020-08-03 DIAGNOSIS — Z96652 Presence of left artificial knee joint: Secondary | ICD-10-CM | POA: Diagnosis not present

## 2020-08-03 DIAGNOSIS — M25562 Pain in left knee: Secondary | ICD-10-CM | POA: Diagnosis not present

## 2020-08-08 ENCOUNTER — Other Ambulatory Visit: Payer: Self-pay | Admitting: Nurse Practitioner

## 2020-08-08 NOTE — Telephone Encounter (Signed)
Patient has upcoming appointment 09/03/20

## 2020-08-10 DIAGNOSIS — M25562 Pain in left knee: Secondary | ICD-10-CM | POA: Diagnosis not present

## 2020-08-10 DIAGNOSIS — Z96652 Presence of left artificial knee joint: Secondary | ICD-10-CM | POA: Diagnosis not present

## 2020-08-13 DIAGNOSIS — Z96652 Presence of left artificial knee joint: Secondary | ICD-10-CM | POA: Diagnosis not present

## 2020-08-13 DIAGNOSIS — M25562 Pain in left knee: Secondary | ICD-10-CM | POA: Diagnosis not present

## 2020-08-17 DIAGNOSIS — M25562 Pain in left knee: Secondary | ICD-10-CM | POA: Diagnosis not present

## 2020-08-17 DIAGNOSIS — Z96652 Presence of left artificial knee joint: Secondary | ICD-10-CM | POA: Diagnosis not present

## 2020-09-01 ENCOUNTER — Encounter: Payer: Self-pay | Admitting: Nurse Practitioner

## 2020-09-03 ENCOUNTER — Ambulatory Visit (INDEPENDENT_AMBULATORY_CARE_PROVIDER_SITE_OTHER): Payer: Medicare Other | Admitting: Nurse Practitioner

## 2020-09-03 ENCOUNTER — Encounter: Payer: Self-pay | Admitting: Nurse Practitioner

## 2020-09-03 ENCOUNTER — Other Ambulatory Visit: Payer: Self-pay

## 2020-09-03 VITALS — BP 123/73 | HR 77 | Temp 98.5°F | Wt 261.8 lb

## 2020-09-03 DIAGNOSIS — Z96652 Presence of left artificial knee joint: Secondary | ICD-10-CM

## 2020-09-03 DIAGNOSIS — E6609 Other obesity due to excess calories: Secondary | ICD-10-CM

## 2020-09-03 DIAGNOSIS — G709 Myoneural disorder, unspecified: Secondary | ICD-10-CM | POA: Diagnosis not present

## 2020-09-03 DIAGNOSIS — E559 Vitamin D deficiency, unspecified: Secondary | ICD-10-CM | POA: Diagnosis not present

## 2020-09-03 DIAGNOSIS — R351 Nocturia: Secondary | ICD-10-CM

## 2020-09-03 DIAGNOSIS — R7301 Impaired fasting glucose: Secondary | ICD-10-CM | POA: Diagnosis not present

## 2020-09-03 DIAGNOSIS — E782 Mixed hyperlipidemia: Secondary | ICD-10-CM

## 2020-09-03 DIAGNOSIS — Z6838 Body mass index (BMI) 38.0-38.9, adult: Secondary | ICD-10-CM

## 2020-09-03 DIAGNOSIS — N401 Enlarged prostate with lower urinary tract symptoms: Secondary | ICD-10-CM

## 2020-09-03 DIAGNOSIS — I340 Nonrheumatic mitral (valve) insufficiency: Secondary | ICD-10-CM

## 2020-09-03 DIAGNOSIS — I10 Essential (primary) hypertension: Secondary | ICD-10-CM | POA: Diagnosis not present

## 2020-09-03 MED ORDER — CHOLECALCIFEROL 1.25 MG (50000 UT) PO TABS: 1.0000 | ORAL_TABLET | ORAL | 4 refills | Status: DC

## 2020-09-03 MED ORDER — BACLOFEN 10 MG PO TABS: 10.0000 mg | ORAL_TABLET | Freq: Three times a day (TID) | ORAL | 4 refills | Status: DC

## 2020-09-03 MED ORDER — TAMSULOSIN HCL 0.4 MG PO CAPS: ORAL_CAPSULE | ORAL | 4 refills | Status: DC

## 2020-09-03 NOTE — Assessment & Plan Note (Signed)
Chronic, ongoing.  Continue statin daily and adjust dosing as needed. Lipid panel today. 

## 2020-09-03 NOTE — Progress Notes (Signed)
BP 123/73   Pulse 77   Temp 98.5 F (36.9 C) (Oral)   Wt 261 lb 12.8 oz (118.8 kg)   SpO2 96%   BMI 38.66 kg/m    Subjective:    Patient ID: Ryan Schmidt, male    DOB: 04/25/1955, 65 y.o.   MRN: [id number]  HPI: Ryan Schmidt is a 65 y.o. male  Chief Complaint  Patient presents with   Routine Post Op    Patient is here for a follow up on Knee Replacement. Patient denies having any problems or concerns at today's visit.    HYPERTENSION / HYPERLIPIDEMIA Continues on HCTZ and Atorvastatin.  Followed by cardiology with recent echo showing EF 50% and moderate mitral valve regurgitation and stress test normal.  Last cardiology visit  02/27/20 -- no changes, may repeat echo next year. Satisfied with current treatment? yes Duration of hypertension: years BP monitoring frequency: occasionally BP range: 120/70 range at home BP medication side effects: no Duration of hyperlipidemia: years Cholesterol medication side effects: no Cholesterol supplements: none Medication compliance: good compliance Aspirin: no Recent stressors: none Recurrent headaches: no Visual changes: no Palpitations: no Dyspnea: no Chest pain: no Lower extremity edema: no Dizzy/lightheaded: no  The ASCVD Risk score Denman George(Goff DC Jr., et al., 2013) failed to calculate for the following reasons:   The valid total cholesterol range is 130 to 320 mg/dL  BPH Continues on Tamsulosin. BPH status: controlled Satisfied with current treatment?: yes Medication side effects: no Medication compliance: good compliance Duration: years Nocturia: 1/night Urinary frequency:no Incomplete voiding: no Urgency: no Weak urinary stream: no Straining to start stream: no Dysuria: no Severity: mild IPSS Questionnaire (AUA-7): Over the past month.   1)  How often have you had a sensation of not emptying your bladder completely after you finish urinating?  0 - Not at all  2)  How often have you had to urinate again less than two  hours after you finished urinating? 0 - Not at all  3)  How often have you found you stopped and started again several times when you urinated?  0 - Not at all  4) How difficult have you found it to postpone urination?  0 - Not at all  5) How often have you had a weak urinary stream?  0 - Not at all  6) How often have you had to push or strain to begin urination?  0 - Not at all  7) How many times did you most typically get up to urinate from the time you went to bed until the time you got up in the morning?  2 - 2 times  Total score:  0-7 mildly symptomatic   8-19 moderately symptomatic   20-35 severely symptomatic    NEUROMUSCULAR DISORDER: Reports no issues as long as take Baclofen, takes these only as needed (often takes night time dose).  His son's have same muscle disorder. His cardiologist once told him to keep walking, has been walking.   Had a total left knee replacement on 06/07/20 and presents today to follow-up on this -- had bone on bone.  Performed by Dr. Rosita KeaMenz.  Plan is to replace left hip in future, "bone so brittle it is breaking lose".  Did physical therapy post surgery -- for 3 weeks.  Labs in February noted Vitamin D 9.9 -- is not taking supplement.  Relevant past medical, surgical, family and social history reviewed and updated as indicated. Interim medical history since our last visit reviewed.  Allergies and medications reviewed and updated.  Review of Systems  Constitutional:  Negative for activity change, diaphoresis, fatigue and fever.  Respiratory:  Negative for cough, chest tightness, shortness of breath and wheezing.   Cardiovascular:  Negative for chest pain, palpitations and leg swelling.  Gastrointestinal: Negative.   Musculoskeletal:  Positive for arthralgias.  Neurological: Negative.   Psychiatric/Behavioral: Negative.     Per HPI unless specifically indicated above     Objective:    BP 123/73   Pulse 77   Temp 98.5 F (36.9 C) (Oral)   Wt 261 lb 12.8  oz (118.8 kg)   SpO2 96%   BMI 38.66 kg/m   Wt Readings from Last 3 Encounters:  09/03/20 261 lb 12.8 oz (118.8 kg)  06/07/20 262 lb 9.1 oz (119.1 kg)  05/29/20 262 lb 8 oz (119.1 kg)    Physical Exam Vitals and nursing note reviewed.  Constitutional:      General: He is awake. He is not in acute distress.    Appearance: He is well-developed and well-groomed. He is obese. He is not ill-appearing or toxic-appearing.  HENT:     Head: Normocephalic and atraumatic.     Right Ear: Hearing normal. No drainage.     Left Ear: Hearing normal. No drainage.  Eyes:     General: Lids are normal.        Right eye: No discharge.        Left eye: No discharge.     Conjunctiva/sclera: Conjunctivae normal.     Pupils: Pupils are equal, round, and reactive to light.  Neck:     Thyroid: No thyromegaly.     Vascular: No carotid bruit.  Cardiovascular:     Rate and Rhythm: Normal rate and regular rhythm.     Heart sounds: Normal heart sounds, S1 normal and S2 normal. No murmur heard.   No gallop.  Pulmonary:     Effort: Pulmonary effort is normal. No accessory muscle usage or respiratory distress.     Breath sounds: Normal breath sounds.  Abdominal:     General: Bowel sounds are normal.     Palpations: Abdomen is soft. There is no hepatomegaly or splenomegaly.  Musculoskeletal:        General: Normal range of motion.     Cervical back: Normal range of motion and neck supple.     Right lower leg: No edema.     Left lower leg: No edema.  Lymphadenopathy:     Cervical: No cervical adenopathy.  Skin:    General: Skin is warm and dry.     Capillary Refill: Capillary refill takes less than 2 seconds.     Comments: Scar to anterior aspect of left knee, healed.  Neurological:     Mental Status: He is alert and oriented to person, place, and time.     Deep Tendon Reflexes: Reflexes are normal and symmetric.  Psychiatric:        Attention and Perception: Attention normal.        Mood and Affect:  Mood normal.        Speech: Speech normal.        Behavior: Behavior normal. Behavior is cooperative.        Thought Content: Thought content normal.    Results for orders placed or performed during the hospital encounter of 06/07/20  CBC  Result Value Ref Range   WBC 11.2 (H) 4.0 - 10.5 K/uL   RBC 4.03 (L) 4.22 - 5.81  MIL/uL   Hemoglobin 13.5 13.0 - 17.0 g/dL   HCT 82.5 (L) 05.3 - 97.6 %   MCV 96.3 80.0 - 100.0 fL   MCH 33.5 26.0 - 34.0 pg   MCHC 34.8 30.0 - 36.0 g/dL   RDW 73.4 19.3 - 79.0 %   Platelets 187 150 - 400 K/uL   nRBC 0.0 0.0 - 0.2 %  Creatinine, serum  Result Value Ref Range   Creatinine, Ser 1.03 0.61 - 1.24 mg/dL   GFR, Estimated >24 >09 mL/min  CBC  Result Value Ref Range   WBC 17.9 (H) 4.0 - 10.5 K/uL   RBC 3.78 (L) 4.22 - 5.81 MIL/uL   Hemoglobin 12.5 (L) 13.0 - 17.0 g/dL   HCT 73.5 (L) 32.9 - 92.4 %   MCV 94.4 80.0 - 100.0 fL   MCH 33.1 26.0 - 34.0 pg   MCHC 35.0 30.0 - 36.0 g/dL   RDW 26.8 34.1 - 96.2 %   Platelets 195 150 - 400 K/uL   nRBC 0.0 0.0 - 0.2 %  Basic metabolic panel  Result Value Ref Range   Sodium 135 135 - 145 mmol/L   Potassium 5.5 (H) 3.5 - 5.1 mmol/L   Chloride 100 98 - 111 mmol/L   CO2 28 22 - 32 mmol/L   Glucose, Bld 188 (H) 70 - 99 mg/dL   BUN 15 8 - 23 mg/dL   Creatinine, Ser 2.29 0.61 - 1.24 mg/dL   Calcium 8.8 (L) 8.9 - 10.3 mg/dL   GFR, Estimated >79 >89 mL/min   Anion gap 7 5 - 15  ABO/Rh  Result Value Ref Range   ABO/RH(D)      O POS Performed at [facility], [address] [place], [place] [address]       Assessment & Plan:   Problem List Items Addressed This Visit       Cardiovascular and Mediastinum   Essential hypertension    Chronic, with stable BP readings at home and in office.  Recommend he monitor BP at least a few mornings a week at home and document.  DASH diet at home.  Continue current medication regimen and adjust as needed.  Labs today: CMP and lipid.  Return in 6 months.        Relevant Medications   atorvastatin (LIPITOR) 40 MG tablet   Other Relevant Orders   Comprehensive metabolic panel   Nonrheumatic mitral valve regurgitation    Noted on echo, continue collaboration with cardiology.      Relevant Medications   atorvastatin (LIPITOR) 40 MG tablet     Nervous and Auditory   Neuromuscular disorder (HCC) - Primary    Chronic, ongoing.  Continue Baclofen, recommend he take this as ordered to prevent issues while walking.  He declines referral to neurology or PT at this time.  Return in February for physical.      Relevant Medications   baclofen (LIORESAL) 10 MG tablet     Genitourinary   BPH (benign prostatic hyperplasia)    Chronic, stable with Flomax.  Continue current regimen and adjust as needed.  PSA at physical.      Relevant Medications   tamsulosin (FLOMAX) 0.4 MG CAPS capsule     Other   Obesity    BMI 38.66.  Recommended eating smaller high protein, low fat meals more frequently and exercising 30 mins a day 5 times a week with a goal of 10-15lb weight loss in the next 3 months. Patient voiced  their understanding and motivation to adhere to these recommendations.       Mixed hyperlipidemia    Chronic, ongoing.  Continue statin daily and adjust dosing as needed.  Lipid panel today.      Relevant Medications   atorvastatin (LIPITOR) 40 MG tablet   Other Relevant Orders   Lipid Panel w/o Chol/HDL Ratio   Vitamin D deficiency    Recheck level today, sent supplement and recommend he start taking.      Relevant Orders   VITAMIN D 25 Hydroxy (Vit-D Deficiency, Fractures)   S/P TKR (total knee replacement) using cement, left    Overall healed at this time, will continue to collaborate with ortho.        Follow up plan: Return in about 5 months (around 02/08/2021) for Annual physical.

## 2020-09-03 NOTE — Assessment & Plan Note (Signed)
Chronic, ongoing.  Continue Baclofen, recommend he take this as ordered to prevent issues while walking.  He declines referral to neurology or PT at this time.  Return in February for physical.

## 2020-09-03 NOTE — Assessment & Plan Note (Signed)
Noted on echo, continue collaboration with cardiology.   

## 2020-09-03 NOTE — Assessment & Plan Note (Signed)
Recheck level today, sent supplement and recommend he start taking.

## 2020-09-03 NOTE — Assessment & Plan Note (Signed)
BMI 38.66.  Recommended eating smaller high protein, low fat meals more frequently and exercising 30 mins a day 5 times a week with a goal of 10-15lb weight loss in the next 3 months. Patient voiced their understanding and motivation to adhere to these recommendations.

## 2020-09-03 NOTE — Assessment & Plan Note (Signed)
Chronic, with stable BP readings at home and in office.  Recommend he monitor BP at least a few mornings a week at home and document.  DASH diet at home.  Continue current medication regimen and adjust as needed.  Labs today: CMP and lipid.  Return in 6 months.

## 2020-09-03 NOTE — Patient Instructions (Signed)
Vitamin D Deficiency Vitamin D deficiency is when your body does not have enough vitamin D. Vitamin D is important to your body because: It helps your body use other minerals. It helps to keep your bones strong and healthy. It may help to prevent some diseases. It helps your heart and other muscles work well. Not getting enough vitamin D can make your bones soft. It can also cause other health problems. What are the causes? This condition may be caused by: Not eating enough foods that contain vitamin D. Not getting enough sun. Having diseases that make it hard for your body to absorb vitamin D. Having a surgery in which a part of the stomach or a part of the small intestine is removed. Having kidney disease or liver disease. What increases the risk? You are more likely to get this condition if: You are older. You do not spend much time outdoors. You live in a nursing home. You have had broken bones. You have weak or thin bones (osteoporosis). You have a disease or condition that changes how your body absorbs vitamin D. You have dark skin. You take certain medicines. You are overweight or obese. What are the signs or symptoms? In mild cases, there may not be any symptoms. If the condition is very bad, symptoms may include: Bone pain. Muscle pain. Falling often. Broken bones caused by a minor injury. How is this treated? Treatment may include taking supplements as told by your doctor. Your doctor will tell you what dose is best for you. Supplements may include: Vitamin D. Calcium. Follow these instructions at home: Eating and drinking  Eat foods that contain vitamin D, such as: Dairy products, cereals, or juices with added vitamin D. Check the label. Fish, such as salmon or trout. Eggs. Oysters. Mushrooms. The items listed above may not be a complete list of what you can eat and drink. Contact a dietitian for more options. General instructions Take medicines and  supplements only as told by your doctor. Get regular, safe exposure to natural sunlight. Do not use a tanning bed. Maintain a healthy weight. Lose weight if needed. Keep all follow-up visits as told by your doctor. This is important. How is this prevented? You can get vitamin D by: Eating foods that naturally contain vitamin D. Eating or drinking products that have vitamin D added to them, such as cereals, juices, and milk. Taking vitamin D or a multivitamin that contains vitamin D. Being in the sun. Your body makes vitamin D when your skin is exposed to sunlight. Your body changes the sunlight into a form of the vitamin that it can use. Contact a doctor if: Your symptoms do not go away. You feel sick to your stomach (nauseous). You throw up (vomit). You poop less often than normal, or you have trouble pooping (constipation). Summary Vitamin D deficiency is when your body does not have enough vitamin D. Vitamin D helps to keep your bones strong and healthy. This condition is often treated by taking a supplement. Your doctor will tell you what dose is best for you. This information is not intended to replace advice given to you by your health care provider. Make sure you discuss any questions you have with your health care provider. Document Revised: 08/31/2017 Document Reviewed: 08/31/2017 Elsevier Patient Education  2022 [employer]  

## 2020-09-03 NOTE — Assessment & Plan Note (Signed)
Overall healed at this time, will continue to collaborate with ortho.

## 2020-09-03 NOTE — Assessment & Plan Note (Signed)
Chronic, stable with Flomax.  Continue current regimen and adjust as needed.  PSA at physical.

## 2020-09-04 ENCOUNTER — Other Ambulatory Visit: Payer: Medicare Other

## 2020-09-04 DIAGNOSIS — R7301 Impaired fasting glucose: Secondary | ICD-10-CM | POA: Diagnosis not present

## 2020-09-04 LAB — COMPREHENSIVE METABOLIC PANEL
ALT: 34 IU/L (ref 0–44)
AST: 34 IU/L (ref 0–40)
Albumin/Globulin Ratio: 1.5 (ref 1.2–2.2)
Albumin: 4.5 g/dL (ref 3.8–4.8)
Alkaline Phosphatase: 106 IU/L (ref 44–121)
BUN/Creatinine Ratio: 14 (ref 10–24)
BUN: 14 mg/dL (ref 8–27)
Bilirubin Total: 0.7 mg/dL (ref 0.0–1.2)
CO2: 27 mmol/L (ref 20–29)
Calcium: 9.4 mg/dL (ref 8.6–10.2)
Chloride: 96 mmol/L (ref 96–106)
Creatinine, Ser: 0.98 mg/dL (ref 0.76–1.27)
Globulin, Total: 3.1 g/dL (ref 1.5–4.5)
Glucose: 168 mg/dL — ABNORMAL HIGH (ref 65–99)
Potassium: 4.4 mmol/L (ref 3.5–5.2)
Sodium: 137 mmol/L (ref 134–144)
Total Protein: 7.6 g/dL (ref 6.0–8.5)
eGFR: 85 mL/min/{1.73_m2} (ref >59)

## 2020-09-04 LAB — LIPID PANEL W/O CHOL/HDL RATIO
Cholesterol, Total: 145 mg/dL (ref 100–199)
HDL: 36 mg/dL — ABNORMAL LOW (ref >39)
LDL Chol Calc (NIH): 84 mg/dL (ref 0–99)
Triglycerides: 139 mg/dL (ref 0–149)
VLDL Cholesterol Cal: 25 mg/dL (ref 5–40)

## 2020-09-04 LAB — VITAMIN D 25 HYDROXY (VIT D DEFICIENCY, FRACTURES): Vit D, 25-Hydroxy: 25.9 ng/mL — ABNORMAL LOW (ref 30.0–100.0)

## 2020-09-04 NOTE — Addendum Note (Signed)
Addended by: Aura Dials T on: 09/04/2020 06:30 AM   Modules accepted: Orders

## 2020-09-04 NOTE — Progress Notes (Signed)
Good morning, please let Ryan Schmidt know his labs have returned and please ask lab to add on A1c to labs from yesterday as sugars are elevated.  I will place order.  For labs: - Glucose is elevated and was on labs two months ago as well.  I am going to add on A1c to check and ensure no diabetes is presenting, will let you know when this returns and if we need to treat. - Cholesterol levels remain stable, continue current medication.  If any questions please let me know.  Have a great day!! Keep being amazing!!  Thank you for allowing me to participate in your care.  I appreciate you. Kindest regards, Garon Melander

## 2020-09-05 ENCOUNTER — Encounter: Payer: Self-pay | Admitting: Nurse Practitioner

## 2020-09-05 DIAGNOSIS — E1169 Type 2 diabetes mellitus with other specified complication: Secondary | ICD-10-CM | POA: Insufficient documentation

## 2020-09-05 DIAGNOSIS — E669 Obesity, unspecified: Secondary | ICD-10-CM | POA: Insufficient documentation

## 2020-09-05 DIAGNOSIS — R7309 Other abnormal glucose: Secondary | ICD-10-CM | POA: Insufficient documentation

## 2020-09-05 LAB — HEMOGLOBIN A1C
Est. average glucose Bld gHb Est-mCnc: 126 mg/dL
Hgb A1c MFr Bld: 6 % — ABNORMAL HIGH (ref 4.8–5.6)

## 2020-09-05 NOTE — Progress Notes (Signed)
Please let Arben know his A1c has returned.  The A1C is the diabetes testing we talked about, this looks at your blood sugars over the past 3 months and turns the average into a number.  Your number is 6.0%, meaning you are prediabetic.  Any number 5.7 to 6.4 is considered prediabetes and any number 6.5 or greater is considered diabetes.   I would recommend heavy focus on decreasing foods high in sugar and your intake of things like bread products, pasta, and rice.  The American Diabetes Association online has a large amount of information on diet changes to make.  We will recheck this number in 3 months to ensure you are not continuing to trend upwards and move into diabetes.  Have a good day. Keep being amazing!!  Thank you for allowing me to participate in your care.  I appreciate you. Kindest regards, Kensington Rios

## 2020-09-25 ENCOUNTER — Other Ambulatory Visit: Payer: Self-pay | Admitting: Nurse Practitioner

## 2020-09-25 NOTE — Telephone Encounter (Signed)
Requested medications are due for refill today.  Yes  Requested medications are on the active medications list.  yes  Last refill. 09/03/2020  Future visit scheduled.   yes  Notes to clinic.  Rx signed as historical medication.

## 2020-10-22 ENCOUNTER — Ambulatory Visit (INDEPENDENT_AMBULATORY_CARE_PROVIDER_SITE_OTHER): Payer: Medicare Other

## 2020-10-22 ENCOUNTER — Other Ambulatory Visit: Payer: Self-pay

## 2020-10-22 ENCOUNTER — Ambulatory Visit: Payer: Medicare Other

## 2020-10-22 DIAGNOSIS — Z23 Encounter for immunization: Secondary | ICD-10-CM

## 2020-10-26 ENCOUNTER — Ambulatory Visit (INDEPENDENT_AMBULATORY_CARE_PROVIDER_SITE_OTHER): Payer: Medicare Other

## 2020-10-26 VITALS — Ht 69.0 in | Wt 257.0 lb

## 2020-10-26 DIAGNOSIS — Z0000 Encounter for general adult medical examination without abnormal findings: Secondary | ICD-10-CM | POA: Diagnosis not present

## 2020-10-26 NOTE — Progress Notes (Signed)
I connected with Ryan Schmidt today by telephone and verified that I am speaking with the correct person using two identifiers. Location patient: home Location provider: work Persons participating in the virtual visit: Ryan Schmidt, Ryan Ponder LPN.   I discussed the limitations, risks, security and privacy concerns of performing an evaluation and management service by telephone and the availability of in person appointments. I also discussed with the patient that there may be a patient responsible charge related to this service. The patient expressed understanding and verbally consented to this telephonic visit.    Interactive audio and video telecommunications were attempted between this provider and patient, however failed, due to patient having technical difficulties OR patient did not have access to video capability.  We continued and completed visit with audio only.     Vital signs may be patient reported or missing.  Subjective:   [name] is a 65 y.o. male who presents for Medicare Annual/Subsequent preventive examination.  Review of Systems     Cardiac Risk Factors include: advanced age (>59men, >55 women);dyslipidemia;hypertension;male gender;obesity (BMI >30kg/m2);sedentary lifestyle     Objective:    Today's Vitals   10/26/20 1341  Weight: 257 lb (116.6 kg)  Height: 5\' 9"  (1.753 m)   Body mass index is 37.95 kg/m.  Advanced Directives 10/26/2020 06/07/2020 06/07/2020 05/29/2020 04/22/2020 10/21/2019 10/20/2018  Does Patient Have a Medical Advance Directive? No No No No No No No  Would patient like information on creating a medical advance directive? - - No - Patient declined No - Patient declined - - Yes (MAU/Ambulatory/Procedural Areas - Information given)    Current Medications (verified) Outpatient Encounter Medications as of 10/26/2020  Medication Sig   acetaminophen (TYLENOL) 500 MG tablet Take 1,000 mg by mouth every 6 (six) hours as needed for mild pain or  moderate pain.   atorvastatin (LIPITOR) 40 MG tablet TAKE 1 TABLET BY MOUTH  DAILY   baclofen (LIORESAL) 10 MG tablet Take 1 tablet (10 mg total) by mouth 3 (three) times daily.   hydrochlorothiazide (HYDRODIURIL) 25 MG tablet TAKE 1 TABLET BY MOUTH  DAILY   tamsulosin (FLOMAX) 0.4 MG CAPS capsule TAKE 1 CAPSULE BY MOUTH  DAILY AFTER SUPPER   Cholecalciferol 1.25 MG (50000 UT) TABS Take 1 tablet by mouth once a week. (Patient not taking: Reported on 10/26/2020)   No facility-administered encounter medications on file as of 10/26/2020.    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   Procedure: TOTAL KNEE ARTHROPLASTY - 08/07/2020 to Assist;  Surgeon: Cranston Neighbor, MD;  Location: [facility] ORS;  Service: Orthopedics;  Laterality: Left;   Family History  Problem Relation Age of Onset   Cancer Mother    Social History   Socioeconomic History   Marital status: Married    Spouse name: Not on file   Number of children: Not on file   Years of education: Not on file   Highest education level: Not on file  Occupational History   Occupation: disability  Tobacco Use   Smoking status: Former  Smokeless tobacco: Never   Tobacco comments:    quit 30 years ago   Vaping Use   Vaping Use: Never used  Substance and Sexual Activity   Alcohol use: Not Currently   Drug use: Not Currently   Sexual activity: Yes  Other Topics Concern   Not on file  Social History Narrative   Not on file   Social Determinants of Health   Financial Resource Strain: Low Risk    Difficulty of Paying Living  Expenses: Not hard at all  Food Insecurity: No Food Insecurity   Worried About Programme researcher, broadcasting/film/video in the Last Year: Never true   Ran Out of Food in the Last Year: Never true  Transportation Needs: No Transportation Needs   Lack of Transportation (Medical): No   Lack of Transportation (Non-Medical): No  Physical Activity: Inactive   Days of Exercise per Week: 0 days   Minutes of Exercise per Session: 0 min  Stress: No Stress Concern Present   Feeling of Stress : Not at all  Social Connections: Not on file    Tobacco Counseling Counseling given: Not Answered Tobacco comments: quit 30 years ago    Clinical Intake:  Pre-visit preparation completed: Yes  Pain : No/denies pain     Nutritional Status: BMI > 30  Obese Nutritional Risks: None Diabetes: No  How often do you need to have someone help you when you read instructions, pamphlets, or other written materials from your doctor or pharmacy?: 1 - Never What is the last grade level you completed in school?: GED  Diabetic? no  Interpreter Needed?: No  Information entered by :: NAllen LPN   Activities of Daily Living In your present state of health, do you have any difficulty performing the following activities: 10/26/2020 06/07/2020  Hearing? N -  Vision? N -  Difficulty concentrating or making decisions? N -  Walking or climbing stairs? N -  Dressing or bathing? N -  Doing errands, shopping? N Y  Comment - -  Quarry manager and eating ? N -  Using the Toilet? N -  In the past six months, have you accidently leaked urine? N -  Do you have problems with loss of bowel control? N -  Managing your Medications? N -  Managing your Finances? N -  Housekeeping or managing your Housekeeping? N -  Some recent data might be hidden    Patient Care Team: Marjie Skiff, NP as PCP - General (Nurse Practitioner)  Indicate any recent Medical Services you may have received from other than [facility] providers in the past year  (date may be approximate).     Assessment:   This is a routine wellness examination for Ryan Schmidt.  Hearing/Vision screen Vision Screening - Comments:: Regular eye exams, [facility]  Dietary issues and exercise activities discussed: Current Exercise Habits: The patient does not participate in regular exercise at present   Goals Addressed             This Visit's Progress    Patient Stated       10/26/2020, wants to weigh 200 pounds       Depression Screen PHQ 2/9 Scores 10/26/2020 10/21/2019 02/08/2019 10/20/2018 01/20/2018 12/16/2017  PHQ - 2 Score 0 0 1 0 1 0  PHQ- 9 Score - - - - 4 3    Fall Risk Fall Risk  10/26/2020 10/21/2019 02/08/2019 10/20/2018 04/21/2018  Falls in the past year? 0 1 1 0 0  Comment -  fell at the store, tripped on the pavement - - -  Number falls in past yr: - 0 0 0 -  Injury with Fall? - 0 0 0 -  Risk for fall due to : Impaired balance/gait;Medication side effect Medication side effect;History of fall(s) - - -  Follow up Falls evaluation completed;Education provided;Falls prevention discussed Falls evaluation completed;Education provided;Falls prevention discussed Falls evaluation completed - Falls evaluation completed    FALL RISK PREVENTION PERTAINING TO THE HOME:  Any stairs in or around the home? No  If so, are there any without handrails?  N/a Home free of loose throw rugs in walkways, pet beds, electrical cords, etc? Yes  Adequate lighting in your home to reduce risk of falls? Yes   ASSISTIVE DEVICES UTILIZED TO PREVENT FALLS:  Life alert? No  Use of a cane, walker or w/c? No  Grab bars in the bathroom? No  Shower chair or bench in shower? No  Elevated toilet seat or a handicapped toilet? No   TIMED UP AND GO:  Was the test performed? No .       Cognitive Function:     6CIT Screen 10/26/2020 10/21/2019  What Year? 0 points 0 points  What month? 0 points 0 points  What time? 0 points 0 points  Count back from 20 0  points 0 points  Months in reverse 4 points 4 points  Repeat phrase 0 points 2 points  Total Score 4 6    Immunizations Immunization History  Administered Date(s) Administered   Fluad Quad(high Dose 65+) 10/22/2020   Influenza,inj,Quad PF,6+ Mos 01/20/2018, 11/05/2018   Influenza-Unspecified 01/20/2018   PFIZER(Purple Top)SARS-COV-2 Vaccination 03/25/2019, 04/15/2019, 12/09/2019   Tdap 02/08/2019    TDAP status: Up to date  Flu Vaccine status: Up to date  Pneumococcal vaccine status: Due, Education has been provided regarding the importance of this vaccine. Advised may receive this vaccine at local pharmacy or Health Dept. Aware to provide a copy of the vaccination record if obtained from local pharmacy or Health Dept. Verbalized acceptance and understanding.  Covid-19 vaccine status: Completed vaccines  Qualifies for Shingles Vaccine? Yes   Zostavax completed No   Shingrix Completed?: No.    Education has been provided regarding the importance of this vaccine. Patient has been advised to call insurance company to determine out of pocket expense if they have not yet received this vaccine. Advised may also receive vaccine at local pharmacy or Health Dept. Verbalized acceptance and understanding.  Screening Tests Health Maintenance  Topic Date Due   Zoster Vaccines- Shingrix (1 of 2) Never done   Pneumonia Vaccine 57+ Years old (1 - PCV) Never done   COVID-19 Vaccine (4 - Booster for Pfizer series) 02/03/2020   COLONOSCOPY (Pts 45-71yrs Insurance coverage will need to be confirmed)  04/20/2024   TETANUS/TDAP  02/07/2029   INFLUENZA VACCINE  Completed   Hepatitis C Screening  Completed   HPV VACCINES  Aged Out    Health Maintenance  Health Maintenance Due  Topic Date Due   Zoster Vaccines- Shingrix (1 of 2) Never done   Pneumonia Vaccine 38+ Years old (1 - PCV) Never done   COVID-19 Vaccine (4 - Booster for Pfizer series) 02/03/2020    Colorectal cancer screening: Type  of screening: Colonoscopy. Completed 04/20/2017. Repeat every 7 years  Lung Cancer Screening: (Low Dose CT Chest recommended if Age 14-80 years, 30 pack-year currently smoking OR have quit w/in 15years.) does not qualify.   Lung Cancer Screening  Referral: no  Additional Screening:  Hepatitis C Screening: does qualify; Completed 01/20/2018  Vision Screening: Recommended annual ophthalmology exams for early detection of glaucoma and other disorders of the eye. Is the patient up to date with their annual eye exam?  Yes  Who is the provider or what is the name of the office in which the patient attends annual eye exams? [facility] If pt is not established with a provider, would they like to be referred to a provider to establish care? No .   Dental Screening: Recommended annual dental exams for proper oral hygiene  Community Resource Referral / Chronic Care Management: CRR required this visit?  No   CCM required this visit?  No      Plan:     I have personally reviewed and noted the following in the patient's chart:   Medical and social history Use of alcohol, tobacco or illicit drugs  Current medications and supplements including opioid prescriptions. Patient is not currently taking opioid prescriptions. Functional ability and status Nutritional status Physical activity Advanced directives List of other physicians Hospitalizations, surgeries, and ER visits in previous 12 months Vitals Screenings to include cognitive, depression, and falls Referrals and appointments  In addition, I have reviewed and discussed with patient certain preventive protocols, quality metrics, and best practice recommendations. A written personalized care plan for preventive services as well as general preventive health recommendations were provided to patient.     Barb Merino, LPN   16/01/930   Nurse Notes:

## 2020-10-26 NOTE — Patient Instructions (Signed)
Mr. Ryan Schmidt , Thank you for taking time to come for your Medicare Wellness Visit. I appreciate your ongoing commitment to your health goals. Please review the following plan we discussed and let me know if I can assist you in the future.   Screening recommendations/referrals: Colonoscopy: completed 04/20/2017 Recommended yearly ophthalmology/optometry visit for glaucoma screening and checkup Recommended yearly dental visit for hygiene and checkup  Vaccinations: Influenza vaccine: completed 10/22/2020 Pneumococcal vaccine: due Tdap vaccine: completed 02/08/2019, due 02/07/2029 Shingles vaccine: discussed   Covid-19:  12/09/2019, 04/15/2019, 03/25/2019  Advanced directives: Advance directive discussed with you today. Even though you declined this today please call our office should you change your mind and we can give you the proper paperwork for you to fill out.  Conditions/risks identified: none  Next appointment: Follow up in one year for your annual wellness visit.   Preventive Care 48 Years and Older, Male Preventive care refers to lifestyle choices and visits with your health care provider that can promote health and wellness. What does preventive care include? A yearly physical exam. This is also called an annual well check. Dental exams once or twice a year. Routine eye exams. Ask your health care provider how often you should have your eyes checked. Personal lifestyle choices, including: Daily care of your teeth and gums. Regular physical activity. Eating a healthy diet. Avoiding tobacco and drug use. Limiting alcohol use. Practicing safe sex. Taking low doses of aspirin every day. Taking vitamin and mineral supplements as recommended by your health care provider. What happens during an annual well check? The services and screenings done by your health care provider during your annual well check will depend on your age, overall health, lifestyle risk factors, and family history of  disease. Counseling  Your health care provider may ask you questions about your: Alcohol use. Tobacco use. Drug use. Emotional well-being. Home and relationship well-being. Sexual activity. Eating habits. History of falls. Memory and ability to understand (cognition). Work and work Astronomer. Screening  You may have the following tests or measurements: Height, weight, and BMI. Blood pressure. Lipid and cholesterol levels. These may be checked every 5 years, or more frequently if you are over 41 years old. Skin check. Lung cancer screening. You may have this screening every year starting at age 46 if you have a 30-pack-year history of smoking and currently smoke or have quit within the past 15 years. Fecal occult blood test (FOBT) of the stool. You may have this test every year starting at age 42. Flexible sigmoidoscopy or colonoscopy. You may have a sigmoidoscopy every 5 years or a colonoscopy every 10 years starting at age 47. Prostate cancer screening. Recommendations will vary depending on your family history and other risks. Hepatitis C blood test. Hepatitis B blood test. Sexually transmitted disease (STD) testing. Diabetes screening. This is done by checking your blood sugar (glucose) after you have not eaten for a while (fasting). You may have this done every 1-3 years. Abdominal aortic aneurysm (AAA) screening. You may need this if you are a current or former smoker. Osteoporosis. You may be screened starting at age 47 if you are at high risk. Talk with your health care provider about your test results, treatment options, and if necessary, the need for more tests. Vaccines  Your health care provider may recommend certain vaccines, such as: Influenza vaccine. This is recommended every year. Tetanus, diphtheria, and acellular pertussis (Tdap, Td) vaccine. You may need a Td booster every 10 years. Zoster vaccine. You  may need this after age 75. Pneumococcal 13-valent  conjugate (PCV13) vaccine. One dose is recommended after age 41. Pneumococcal polysaccharide (PPSV23) vaccine. One dose is recommended after age 64. Talk to your health care provider about which screenings and vaccines you need and how often you need them. This information is not intended to replace advice given to you by your health care provider. Make sure you discuss any questions you have with your health care provider. Document Released: 01/19/2015 Document Revised: 09/12/2015 Document Reviewed: 10/24/2014 Elsevier Interactive Patient Education  2017 [place] Prevention in the Home Falls can cause injuries. They can happen to people of all ages. There are many things you can do to make your home safe and to help prevent falls. What can I do on the outside of my home? Regularly fix the edges of walkways and driveways and fix any cracks. Remove anything that might make you trip as you walk through a door, such as a raised step or threshold. Trim any bushes or trees on the path to your home. Use bright outdoor lighting. Clear any walking paths of anything that might make someone trip, such as rocks or tools. Regularly check to see if handrails are loose or broken. Make sure that both sides of any steps have handrails. Any raised decks and porches should have guardrails on the edges. Have any leaves, snow, or ice cleared regularly. Use sand or salt on walking paths during winter. Clean up any spills in your garage right away. This includes oil or grease spills. What can I do in the bathroom? Use night lights. Install grab bars by the toilet and in the tub and shower. Do not use towel bars as grab bars. Use non-skid mats or decals in the tub or shower. If you need to sit down in the shower, use a plastic, non-slip stool. Keep the floor dry. Clean up any water that spills on the floor as soon as it happens. Remove soap buildup in the tub or shower regularly. Attach bath mats  securely with double-sided non-slip rug tape. Do not have throw rugs and other things on the floor that can make you trip. What can I do in the bedroom? Use night lights. Make sure that you have a light by your bed that is easy to reach. Do not use any sheets or blankets that are too big for your bed. They should not hang down onto the floor. Have a firm chair that has side arms. You can use this for support while you get dressed. Do not have throw rugs and other things on the floor that can make you trip. What can I do in the kitchen? Clean up any spills right away. Avoid walking on wet floors. Keep items that you use a lot in easy-to-reach places. If you need to reach something above you, use a strong step stool that has a grab bar. Keep electrical cords out of the way. Do not use floor polish or wax that makes floors slippery. If you must use wax, use non-skid floor wax. Do not have throw rugs and other things on the floor that can make you trip. What can I do with my stairs? Do not leave any items on the stairs. Make sure that there are handrails on both sides of the stairs and use them. Fix handrails that are broken or loose. Make sure that handrails are as long as the stairways. Check any carpeting to make sure that it is firmly  attached to the stairs. Fix any carpet that is loose or worn. Avoid having throw rugs at the top or bottom of the stairs. If you do have throw rugs, attach them to the floor with carpet tape. Make sure that you have a light switch at the top of the stairs and the bottom of the stairs. If you do not have them, ask someone to add them for you. What else can I do to help prevent falls? Wear shoes that: Do not have high heels. Have rubber bottoms. Are comfortable and fit you well. Are closed at the toe. Do not wear sandals. If you use a stepladder: Make sure that it is fully opened. Do not climb a closed stepladder. Make sure that both sides of the stepladder  are locked into place. Ask someone to hold it for you, if possible. Clearly mark and make sure that you can see: Any grab bars or handrails. First and last steps. Where the edge of each step is. Use tools that help you move around (mobility aids) if they are needed. These include: Canes. Walkers. Scooters. Crutches. Turn on the lights when you go into a dark area. Replace any light bulbs as soon as they burn out. Set up your furniture so you have a clear path. Avoid moving your furniture around. If any of your floors are uneven, fix them. If there are any pets around you, be aware of where they are. Review your medicines with your doctor. Some medicines can make you feel dizzy. This can increase your chance of falling. Ask your doctor what other things that you can do to help prevent falls. This information is not intended to replace advice given to you by your health care provider. Make sure you discuss any questions you have with your health care provider. Document Released: 10/19/2008 Document Revised: 05/31/2015 Document Reviewed: 01/27/2014 Elsevier Interactive Patient Education  2017 [employer].

## 2020-10-31 ENCOUNTER — Telehealth: Payer: Self-pay | Admitting: Nurse Practitioner

## 2020-10-31 NOTE — Telephone Encounter (Signed)
Copied from CRM [id number]. Topic: General - Other >> Oct 30, 2020  2:49 PM Marylen Ponto wrote: Reason for CRM: Pt stated he received his medications but there was no Vitamin D. Pt requests call back. Cb# [phone]

## 2020-11-01 NOTE — Telephone Encounter (Signed)
Left a message for patient making him aware of Jolene's recommendations about Vitamin D3 supplements. Advised patient to give our office a call back if he has any questions or concerns.

## 2021-01-22 ENCOUNTER — Other Ambulatory Visit: Payer: Self-pay | Admitting: Nurse Practitioner

## 2021-01-22 NOTE — Telephone Encounter (Signed)
Medication Refill - Medication: baclofen (LIORESAL) 10 MG tablet  Has the patient contacted their pharmacy? No. (Agent: If no, request that the patient contact the pharmacy for the refill. If patient does not wish to contact the pharmacy document the reason why and proceed with request.) Patient will call pharmacy in the future he did not have pharmacy number. Agent provided patient with pharmacy #   Preferred Pharmacy (with phone number or street name):  [facility] [place], [place] - [address] [place] Phone:  [phone]  Fax:  [phone]      Has the patient been seen for an appointment in the last year OR does the patient have an upcoming appointment? Yes.    Agent: Please be advised that RX refills may take up to 3 business days. We ask that you follow-up with your pharmacy.

## 2021-01-22 NOTE — Telephone Encounter (Signed)
Requested medication (s) are due for refill today: Yes  Requested medication (s) are on the active medication list: Yes  Last refill:  09/03/20  Future visit scheduled: Yes  Notes to clinic:  Unable to refill per protocol, cannot delegate.      Requested Prescriptions  Pending Prescriptions Disp Refills   baclofen (LIORESAL) 10 MG tablet 270 tablet 4    Sig: Take 1 tablet (10 mg total) by mouth 3 (three) times daily.     Not Delegated - Analgesics:  Muscle Relaxants Failed - 01/22/2021  2:18 PM      Failed - This refill cannot be delegated      Passed - Valid encounter within last 6 months    Recent Outpatient Visits           4 months ago Neuromuscular disorder [place] [facility])   [facility] [place], Dorie Rank, NP   8 months ago Rash   [facility] [place], [place], DO   9 months ago Poison [employer], Clydie Braun, NP   1 year ago Annual physical exam   [facility] [place], Dorie Rank, NP   2 years ago Essential hypertension   [facility] [address], Dorie Rank, NP       Future Appointments             In 1 month [place], Dorie Rank, NP [employer]   In 9 months  [employer] [facility]

## 2021-02-17 NOTE — Patient Instructions (Signed)

## 2021-02-21 ENCOUNTER — Encounter: Payer: Self-pay | Admitting: Nurse Practitioner

## 2021-02-21 ENCOUNTER — Other Ambulatory Visit: Payer: Self-pay

## 2021-02-21 ENCOUNTER — Ambulatory Visit (INDEPENDENT_AMBULATORY_CARE_PROVIDER_SITE_OTHER): Payer: Medicare Other | Admitting: Nurse Practitioner

## 2021-02-21 VITALS — BP 116/78 | HR 88 | Temp 98.2°F | Ht 69.0 in | Wt 250.4 lb

## 2021-02-21 DIAGNOSIS — R7309 Other abnormal glucose: Secondary | ICD-10-CM | POA: Diagnosis not present

## 2021-02-21 DIAGNOSIS — Z0000 Encounter for general adult medical examination without abnormal findings: Secondary | ICD-10-CM

## 2021-02-21 DIAGNOSIS — R351 Nocturia: Secondary | ICD-10-CM

## 2021-02-21 DIAGNOSIS — E782 Mixed hyperlipidemia: Secondary | ICD-10-CM

## 2021-02-21 DIAGNOSIS — E559 Vitamin D deficiency, unspecified: Secondary | ICD-10-CM

## 2021-02-21 DIAGNOSIS — I340 Nonrheumatic mitral (valve) insufficiency: Secondary | ICD-10-CM

## 2021-02-21 DIAGNOSIS — N401 Enlarged prostate with lower urinary tract symptoms: Secondary | ICD-10-CM

## 2021-02-21 DIAGNOSIS — Z23 Encounter for immunization: Secondary | ICD-10-CM

## 2021-02-21 DIAGNOSIS — I10 Essential (primary) hypertension: Secondary | ICD-10-CM | POA: Diagnosis not present

## 2021-02-21 DIAGNOSIS — E6609 Other obesity due to excess calories: Secondary | ICD-10-CM

## 2021-02-21 DIAGNOSIS — G709 Myoneural disorder, unspecified: Secondary | ICD-10-CM

## 2021-02-21 DIAGNOSIS — Z6838 Body mass index (BMI) 38.0-38.9, adult: Secondary | ICD-10-CM

## 2021-02-21 DIAGNOSIS — E66812 Obesity, class 2: Secondary | ICD-10-CM

## 2021-02-21 LAB — MICROALBUMIN, URINE WAIVED
Creatinine, Urine Waived: 200 mg/dL (ref 10–300)
Microalb, Ur Waived: 30 mg/L — ABNORMAL HIGH (ref 0–19)
Microalb/Creat Ratio: <30 mg/g (ref <30)

## 2021-02-21 LAB — BAYER DCA HB A1C WAIVED: HB A1C (BAYER DCA - WAIVED): 9.9 % — ABNORMAL HIGH (ref 4.8–5.6)

## 2021-02-21 MED ORDER — ATORVASTATIN CALCIUM 40 MG PO TABS: 40.0000 mg | ORAL_TABLET | Freq: Every day | ORAL | 4 refills | Status: DC

## 2021-02-21 MED ORDER — HYDROCHLOROTHIAZIDE 25 MG PO TABS: 25.0000 mg | ORAL_TABLET | Freq: Every day | ORAL | 4 refills | Status: DC

## 2021-02-21 MED ORDER — TAMSULOSIN HCL 0.4 MG PO CAPS: ORAL_CAPSULE | ORAL | 4 refills | Status: DC

## 2021-02-21 MED ORDER — BACLOFEN 10 MG PO TABS: 10.0000 mg | ORAL_TABLET | Freq: Three times a day (TID) | ORAL | 4 refills | Status: DC

## 2021-02-21 MED ORDER — SHINGRIX 50 MCG/0.5ML IM SUSR: 0.5000 mL | Freq: Once | INTRAMUSCULAR | 0 refills | Status: AC

## 2021-02-21 NOTE — Assessment & Plan Note (Signed)
Chronic, ongoing.  Continue Baclofen, recommend he take this as ordered to prevent issues while walking.  He declines referral to neurology or PT at this time.  Return in 6 months. 

## 2021-02-21 NOTE — Assessment & Plan Note (Signed)
BMI 36.98, has lost 11 pounds.  Recommended eating smaller high protein, low fat meals more frequently and exercising 30 mins a day 5 times a week with a goal of 10-15lb weight loss in the next 3 months. Patient voiced their understanding and motivation to adhere to these recommendations.

## 2021-02-21 NOTE — Assessment & Plan Note (Signed)
Chronic, stable with Flomax.  Continue current regimen and adjust as needed.  PSA today on labs. 

## 2021-02-21 NOTE — Assessment & Plan Note (Signed)
Noted on past labs, recheck today and continue diet focus. 

## 2021-02-21 NOTE — Assessment & Plan Note (Signed)
Chronic, with stable BP readings at home and in office.  Recommend he monitor BP at least a few mornings a week at home and document.  DASH diet at home.  Continue current medication regimen and adjust as needed.  Labs today: CMP, CBC, TSH, urine ALB, and lipid.  Return in 6 months.

## 2021-02-21 NOTE — Assessment & Plan Note (Signed)
Chronic, ongoing.  He is taking supplement, continue this and check level today. 

## 2021-02-21 NOTE — Assessment & Plan Note (Signed)
Noted on echo, continue collaboration with cardiology.   

## 2021-02-21 NOTE — Progress Notes (Signed)
BP 116/78    Pulse 88    Temp 98.2 F (36.8 C)    Ht 5\' 9"  (1.753 m)    Wt 250 lb 6.4 oz (113.6 kg)    SpO2 96%    BMI 36.98 kg/m    Subjective:    Patient ID: Ryan Schmidt, male    DOB: Jul 13, 1955, 67 y.o.   MRN: [id number]  HPI: Ryan Schmidt is a 66 y.o. male presenting on 02/21/2021 for comprehensive medical examination. Current medical complaints include:none  He currently lives with: wife Interim Problems from his last visit: no  Has neuromuscular disorder that is treated with Baclofen TID, genetic issues.  HYPERTENSION / HYPERLIPIDEMIA Continues on HCTZ and Atorvastatin.  Seen by cardiology with echo showing EF 50% and moderate mitral valve regurgitation and stress test normal.  Last cardiology visit  02/27/20 -- no changes, returns to see them on 02/28/21.  Recent A1c 6%, which he has been working on diet for, has lost 11 pounds. Satisfied with current treatment? yes Duration of hypertension: years BP monitoring frequency: occasionally BP range: 120/70-80 range at home BP medication side effects: no Duration of hyperlipidemia: years Cholesterol medication side effects: no Cholesterol supplements: none Medication compliance: good compliance Aspirin: no Recent stressors: none Recurrent headaches: no Visual changes: no Palpitations: no Dyspnea: no Chest pain: no Lower extremity edema: no Dizzy/lightheaded: no  The 10-year ASCVD risk score (Arnett DK, et al., 2019) is: 14%   Values used to calculate the score:     Age: 70 years     Sex: Male     Is Non-Hispanic African American: Yes     Diabetic: No     Tobacco smoker: No     Systolic Blood Pressure: 99991111 mmHg     Is BP treated: Yes     HDL Cholesterol: 36 mg/dL     Total Cholesterol: 145 mg/dL  BPH Continues on Tamsulosin. BPH status: controlled Satisfied with current treatment?: yes Medication side effects: no Medication compliance: good compliance Duration: years Nocturia: 2-3/night Urinary frequency: yes  -- drinks lots of water and tea Incomplete voiding: no Urgency: no Weak urinary stream: no Straining to start stream: no Dysuria: no Severity: mild IPSS Questionnaire (AUA-7): 7 today on check Over the past month   1)  How often have you had a sensation of not emptying your bladder completely after you finish urinating?  0 - Not at all  2)  How often have you had to urinate again less than two hours after you finished urinating? 3 - About half the time  3)  How often have you found you stopped and started again several times when you urinated?  0 - Not at all  4) How difficult have you found it to postpone urination?  2 - Less than half the time  5) How often have you had a weak urinary stream?  0 - Not at all  6) How often have you had to push or strain to begin urination?  0 - Not at all  7) How many times did you most typically get up to urinate from the time you went to bed until the time you got up in the morning?  2 - 2 times  Total score:  0-7 mildly symptomatic   8-19 moderately symptomatic   20-35 severely symptomatic     Functional Status Survey: Is the patient deaf or have difficulty hearing?: No Does the patient have difficulty seeing, even when wearing glasses/contacts?: No Does  the patient have difficulty concentrating, remembering, or making decisions?: No Does the patient have difficulty walking or climbing stairs?: No Does the patient have difficulty dressing or bathing?: No Does the patient have difficulty doing errands alone such as visiting a doctor's office or shopping?: No  FALL RISK: Fall Risk  02/21/2021 02/21/2021 10/26/2020 10/21/2019 02/08/2019  Falls in the past year? 0 0 0 1 1  Comment - - - fell at the store, tripped on the pavement -  Number falls in past yr: 0 0 - 0 0  Injury with Fall? 0 0 - 0 0  Risk for fall due to : No Fall Risks No Fall Risks Impaired balance/gait;Medication side effect Medication side effect;History of fall(s) -  Follow up Falls  prevention discussed Falls evaluation completed Falls evaluation completed;Education provided;Falls prevention discussed Falls evaluation completed;Education provided;Falls prevention discussed Falls evaluation completed    Depression Screen Depression screen [facility] 2/9 02/21/2021 10/26/2020 10/21/2019 02/08/2019 10/20/2018  Decreased Interest 1 0 0 0 0  Down, Depressed, Hopeless 0 0 0 1 0  PHQ - 2 Score 1 0 0 1 0  Altered sleeping 0 - - - -  Tired, decreased energy 1 - - - -  Change in appetite 1 - - - -  Feeling bad or failure about yourself  0 - - - -  Trouble concentrating 0 - - - -  Moving slowly or fidgety/restless 0 - - - -  Suicidal thoughts 0 - - - -  PHQ-9 Score 3 - - - -  Difficult doing work/chores - - - - -    Advanced Directives <no information>  Past Medical History:  Past Medical History:  Diagnosis Date   Benign prostatic hyperplasia    Hypercholesteremia    Hypertension    Mitral valve insufficiency    Neuromuscular disorder ([place])     Surgical History:  Past Surgical History:  Procedure Laterality Date   APPENDECTOMY     GLAUCOMA SURGERY Bilateral    LEG SURGERY Left    at age 43   TOTAL KNEE ARTHROPLASTY Left 06/07/2020   Procedure: TOTAL KNEE ARTHROPLASTY - Rachelle Hora to Assist;  Surgeon: Hessie Knows, MD;  Location: [facility] ORS;  Service: Orthopedics;  Laterality: Left;    Medications:  Current Outpatient Medications on File Prior to Visit  Medication Sig   acetaminophen (TYLENOL) 500 MG tablet Take 1,000 mg by mouth every 6 (six) hours as needed for mild pain or moderate pain.   Cholecalciferol 1.25 MG (50000 UT) TABS Take 1 tablet by mouth once a week.   No current facility-administered medications on file prior to visit.    Allergies:  Allergies  Allergen Reactions   Ampicillin Other (See Comments)    Hyperventilation   Morphine And Related Itching   Penicillins Other (See Comments)    Jittery Reaction: 15 years ago   Valium [Diazepam]  Other (See Comments)    Nervous, jittery    Social History:  Social History   Socioeconomic History   Marital status: Married    Spouse name: Not on file   Number of children: Not on file   Years of education: Not on file   Highest education level: Not on file  Occupational History   Occupation: disability  Tobacco Use   Smoking status: Former   Smokeless tobacco: Never   Tobacco comments:    quit 30 years ago   Vaping Use   Vaping Use: Never used  Substance and Sexual Activity  Alcohol use: Not Currently   Drug use: Not Currently   Sexual activity: Yes  Other Topics Concern   Not on file  Social History Narrative   Not on file   Social Determinants of Health   Financial Resource Strain: Low Risk    Difficulty of Paying Living Expenses: Not hard at all  Food Insecurity: No Food Insecurity   Worried About Charity fundraiser in the Last Year: Never true   [place] in the Last Year: Never true  Transportation Needs: No Transportation Needs   Lack of Transportation (Medical): No   Lack of Transportation (Non-Medical): No  Physical Activity: Inactive   Days of Exercise per Week: 0 days   Minutes of Exercise per Session: 0 min  Stress: No Stress Concern Present   Feeling of Stress : Not at all  Social Connections: Not on file  Intimate Partner Violence: Not on file   Social History   Tobacco Use  Smoking Status Former  Smokeless Tobacco Never  Tobacco Comments   quit 30 years ago    Social History   Substance and Sexual Activity  Alcohol Use Not Currently    Family History:  Family History  Problem Relation Age of Onset   Cancer Mother     Past medical history, surgical history, medications, allergies, family history and social history reviewed with patient today and changes made to appropriate areas of the chart.   ROS All other ROS negative except what is listed above and in the HPI.      Objective:    BP 116/78    Pulse 88    Temp  98.2 F (36.8 C)    Ht 5\' 9"  (1.753 m)    Wt 250 lb 6.4 oz (113.6 kg)    SpO2 96%    BMI 36.98 kg/m   Wt Readings from Last 3 Encounters:  02/21/21 250 lb 6.4 oz (113.6 kg)  10/26/20 257 lb (116.6 kg)  09/03/20 261 lb 12.8 oz (118.8 kg)    Physical Exam Vitals and nursing note reviewed.  Constitutional:      General: He is awake. He is not in acute distress.    Appearance: He is well-developed and well-groomed. He is obese. He is not ill-appearing or toxic-appearing.  HENT:     Head: Normocephalic and atraumatic.     Right Ear: Hearing, tympanic membrane, ear canal and external ear normal. No drainage.     Left Ear: Hearing, tympanic membrane, ear canal and external ear normal. No drainage.     Nose: Nose normal.     Mouth/Throat:     Pharynx: Uvula midline.  Eyes:     General: Lids are normal.        Right eye: No discharge.        Left eye: No discharge.     Extraocular Movements: Extraocular movements intact.     Conjunctiva/sclera: Conjunctivae normal.     Pupils: Pupils are equal, round, and reactive to light.     Visual Fields: Right eye visual fields normal and left eye visual fields normal.  Neck:     Thyroid: No thyromegaly.     Vascular: No carotid bruit or JVD.     Trachea: Trachea normal.  Cardiovascular:     Rate and Rhythm: Normal rate and regular rhythm.     Pulses:          Dorsalis pedis pulses are 1+ on the right side and 1+ on  the left side.       Posterior tibial pulses are 1+ on the right side and 1+ on the left side.     Heart sounds: S1 normal and S2 normal. Murmur heard.  Systolic murmur is present with a grade of 2/6.    No gallop.  Pulmonary:     Effort: Pulmonary effort is normal. No accessory muscle usage or respiratory distress.     Breath sounds: Normal breath sounds.  Abdominal:     General: Bowel sounds are normal.     Palpations: Abdomen is soft. There is no hepatomegaly or splenomegaly.     Tenderness: There is no abdominal tenderness.   Musculoskeletal:        General: Normal range of motion.     Cervical back: Normal range of motion and neck supple.     Right lower leg: No edema.     Left lower leg: No edema.  Feet:     Right foot:     Protective Sensation: 10 sites tested.  10 sites sensed.     Skin integrity: Dry skin present.     Toenail Condition: Right toenails are abnormally thick. Fungal disease present.    Left foot:     Protective Sensation: 10 sites tested.  10 sites sensed.     Skin integrity: Dry skin present.     Toenail Condition: Left toenails are abnormally thick. Fungal disease present. Lymphadenopathy:     Head:     Right side of head: No submental, submandibular, tonsillar, preauricular or posterior auricular adenopathy.     Left side of head: No submental, submandibular, tonsillar, preauricular or posterior auricular adenopathy.     Cervical: No cervical adenopathy.  Skin:    General: Skin is warm and dry.     Capillary Refill: Capillary refill takes less than 2 seconds.     Findings: No rash.  Neurological:     Mental Status: He is alert and oriented to person, place, and time.     Gait: Gait is intact.     Deep Tendon Reflexes: Reflexes are normal and symmetric.     Reflex Scores:      Brachioradialis reflexes are 2+ on the right side and 2+ on the left side.      Patellar reflexes are 2+ on the right side and 2+ on the left side. Psychiatric:        Attention and Perception: Attention normal.        Mood and Affect: Mood normal.        Speech: Speech normal.        Behavior: Behavior normal. Behavior is cooperative.        Thought Content: Thought content normal.        Cognition and Memory: Cognition normal.   6CIT Screen 02/21/2021 10/26/2020 10/21/2019  What Year? 0 points 0 points 0 points  What month? 0 points 0 points 0 points  What time? 0 points 0 points 0 points  Count back from 20 0 points 0 points 0 points  Months in reverse 0 points 4 points 4 points  Repeat phrase 2  points 0 points 2 points  Total Score 2 4 6    Results for orders placed or performed in visit on 09/04/20  HgB A1c  Result Value Ref Range   Hgb A1c MFr Bld 6.0 (H) 4.8 - 5.6 %   Est. average glucose Bld gHb Est-mCnc 126 mg/dL      Assessment & Plan:  Problem List Items Addressed This Visit       Cardiovascular and Mediastinum   Essential hypertension    Chronic, with stable BP readings at home and in office.  Recommend he monitor BP at least a few mornings a week at home and document.  DASH diet at home.  Continue current medication regimen and adjust as needed.  Labs today: CMP, CBC, TSH, urine ALB, and lipid.  Return in 6 months.       Relevant Medications   hydrochlorothiazide (HYDRODIURIL) 25 MG tablet   atorvastatin (LIPITOR) 40 MG tablet   Other Relevant Orders   CBC with Differential/Platelet   Comprehensive metabolic panel   TSH   Microalbumin, Urine Waived   Nonrheumatic mitral valve regurgitation    Noted on echo, continue collaboration with cardiology.      Relevant Medications   hydrochlorothiazide (HYDRODIURIL) 25 MG tablet   atorvastatin (LIPITOR) 40 MG tablet     Nervous and Auditory   Neuromuscular disorder ([facility]) - Primary    Chronic, ongoing.  Continue Baclofen, recommend he take this as ordered to prevent issues while walking.  He declines referral to neurology or PT at this time.  Return in 6 months.      Relevant Medications   baclofen (LIORESAL) 10 MG tablet   Other Relevant Orders   CBC with Differential/Platelet   Comprehensive metabolic panel   Vitamin [id number]     Genitourinary   BPH (benign prostatic hyperplasia)    Chronic, stable with Flomax.  Continue current regimen and adjust as needed.  PSA today on labs.      Relevant Medications   tamsulosin (FLOMAX) 0.4 MG CAPS capsule   Other Relevant Orders   PSA     Other   Elevated hemoglobin A1c measurement    Noted on past labs, recheck today and continue diet focus.      Relevant  Orders   Bayer DCA Hb A1c Waived   Mixed hyperlipidemia    Chronic, ongoing.  Continue statin daily and adjust dosing as needed.  Lipid panel today.      Relevant Medications   hydrochlorothiazide (HYDRODIURIL) 25 MG tablet   atorvastatin (LIPITOR) 40 MG tablet   Other Relevant Orders   Comprehensive metabolic panel   Lipid Panel w/o Chol/HDL Ratio   Obesity    BMI 36.98, has lost 11 pounds.  Recommended eating smaller high protein, low fat meals more frequently and exercising 30 mins a day 5 times a week with a goal of 10-15lb weight loss in the next 3 months. Patient voiced their understanding and motivation to adhere to these recommendations.       Vitamin D deficiency    Chronic, ongoing.  He is taking supplement, continue this and check level today.      Relevant Orders   VITAMIN D 25 Hydroxy (Vit-D Deficiency, Fractures)   Other Visit Diagnoses     Pneumococcal vaccination given       PCV13 given in office today.   Relevant Orders   Pneumococcal conjugate vaccine 13-valent (Completed)   Need for shingles vaccine       Shingrix vaccines ordered to pharmacy today.   Encounter for annual physical exam       Annual physical with labs today and health maintenance reviewed.       Discussed aspirin prophylaxis for myocardial infarction prevention and decision was that we recommended ASA, and patient refused  LABORATORY TESTING:  Health maintenance labs ordered today as discussed above.  The natural history of prostate cancer and ongoing controversy regarding screening and potential treatment outcomes of prostate cancer has been discussed with the patient. The meaning of a false positive PSA and a false negative PSA has been discussed. He indicates understanding of the limitations of this screening test and wishes to proceed with screening PSA testing.   IMMUNIZATIONS:   - Tdap: Tetanus vaccination status reviewed: last tetanus booster within 10 years. - Influenza: Up to  date - Pneumovax: Not applicable - Prevnar: Up to date - Zostavax vaccine:  Ordered today  SCREENING: - Colonoscopy: Up to date  Discussed with patient purpose of the colonoscopy is to detect colon cancer at curable precancerous or early stages   - AAA Screening: Not applicable  -Hearing Test: Not applicable  -Spirometry: Not applicable   PATIENT COUNSELING:    Sexuality: Discussed sexually transmitted diseases, partner selection, use of condoms, avoidance of unintended pregnancy  and contraceptive alternatives.   Advised to avoid cigarette smoking.  I discussed with the patient that most people either abstain from alcohol or drink within safe limits (<=14/week and <=4 drinks/occasion for males, <=7/weeks and <= 3 drinks/occasion for females) and that the risk for alcohol disorders and other health effects rises proportionally with the number of drinks per week and how often a drinker exceeds daily limits.  Discussed cessation/primary prevention of drug use and availability of treatment for abuse.   Diet: Encouraged to adjust caloric intake to maintain  or achieve ideal body weight, to reduce intake of dietary saturated fat and total fat, to limit sodium intake by avoiding high sodium foods and not adding table salt, and to maintain adequate dietary potassium and calcium preferably from fresh fruits, vegetables, and low-fat dairy products.    Stressed the importance of regular exercise  Injury prevention: Discussed safety belts, safety helmets, smoke detector, smoking near bedding or upholstery.   Dental health: Discussed importance of regular tooth brushing, flossing, and dental visits.   Follow up plan: NEXT PREVENTATIVE PHYSICAL DUE IN 1 YEAR. Return in about 6 months (around 08/21/2021) for HTN/HLD, NEUROMUSCULAR DISEASE, BPH.

## 2021-02-21 NOTE — Assessment & Plan Note (Signed)
Chronic, ongoing.  Continue statin daily and adjust dosing as needed. Lipid panel today. 

## 2021-02-22 ENCOUNTER — Encounter: Payer: Self-pay | Admitting: Nurse Practitioner

## 2021-02-22 ENCOUNTER — Telehealth: Payer: Self-pay | Admitting: Nurse Practitioner

## 2021-02-22 LAB — COMPREHENSIVE METABOLIC PANEL
ALT: 55 IU/L — ABNORMAL HIGH (ref 0–44)
AST: 42 IU/L — ABNORMAL HIGH (ref 0–40)
Albumin/Globulin Ratio: 1.4 (ref 1.2–2.2)
Albumin: 4.8 g/dL (ref 3.8–4.8)
Alkaline Phosphatase: 167 IU/L — ABNORMAL HIGH (ref 44–121)
BUN/Creatinine Ratio: 18 (ref 10–24)
BUN: 21 mg/dL (ref 8–27)
Bilirubin Total: 0.4 mg/dL (ref 0.0–1.2)
CO2: 24 mmol/L (ref 20–29)
Calcium: 9.6 mg/dL (ref 8.6–10.2)
Chloride: 92 mmol/L — ABNORMAL LOW (ref 96–106)
Creatinine, Ser: 1.17 mg/dL (ref 0.76–1.27)
Globulin, Total: 3.4 g/dL (ref 1.5–4.5)
Glucose: 353 mg/dL — ABNORMAL HIGH (ref 70–99)
Potassium: 4.3 mmol/L (ref 3.5–5.2)
Sodium: 132 mmol/L — ABNORMAL LOW (ref 134–144)
Total Protein: 8.2 g/dL (ref 6.0–8.5)
eGFR: 69 mL/min/{1.73_m2} (ref >59)

## 2021-02-22 LAB — CBC WITH DIFFERENTIAL/PLATELET
Basophils Absolute: 0.1 10*3/uL (ref 0.0–0.2)
Basos: 1 %
EOS (ABSOLUTE): 0.2 10*3/uL (ref 0.0–0.4)
Eos: 3 %
Hematocrit: 46.8 % (ref 37.5–51.0)
Hemoglobin: 15.4 g/dL (ref 13.0–17.7)
Immature Grans (Abs): 0 10*3/uL (ref 0.0–0.1)
Immature Granulocytes: 0 %
Lymphocytes Absolute: 2.9 10*3/uL (ref 0.7–3.1)
Lymphs: 53 %
MCH: 31.4 pg (ref 26.6–33.0)
MCHC: 32.9 g/dL (ref 31.5–35.7)
MCV: 95 fL (ref 79–97)
Monocytes Absolute: 0.4 10*3/uL (ref 0.1–0.9)
Monocytes: 7 %
Neutrophils Absolute: 2 10*3/uL (ref 1.4–7.0)
Neutrophils: 36 %
Platelets: 226 10*3/uL (ref 150–450)
RBC: 4.91 x10E6/uL (ref 4.14–5.80)
RDW: 12.6 % (ref 11.6–15.4)
WBC: 5.4 10*3/uL (ref 3.4–10.8)

## 2021-02-22 LAB — LIPID PANEL W/O CHOL/HDL RATIO
Cholesterol, Total: 144 mg/dL (ref 100–199)
HDL: 32 mg/dL — ABNORMAL LOW (ref >39)
LDL Chol Calc (NIH): 61 mg/dL (ref 0–99)
Triglycerides: 325 mg/dL — ABNORMAL HIGH (ref 0–149)
VLDL Cholesterol Cal: 51 mg/dL — ABNORMAL HIGH (ref 5–40)

## 2021-02-22 LAB — TSH: TSH: 2.61 u[IU]/mL (ref 0.450–4.500)

## 2021-02-22 LAB — PSA: Prostate Specific Ag, Serum: 0.8 ng/mL (ref 0.0–4.0)

## 2021-02-22 LAB — VITAMIN D 25 HYDROXY (VIT D DEFICIENCY, FRACTURES): Vit D, 25-Hydroxy: 31.2 ng/mL (ref 30.0–100.0)

## 2021-02-22 LAB — VITAMIN B12: Vitamin B-12: 858 pg/mL (ref 232–1245)

## 2021-02-22 MED ORDER — ONETOUCH VERIO W/DEVICE KIT: PACK | 0 refills | Status: DC

## 2021-02-22 MED ORDER — ONETOUCH VERIO VI STRP: ORAL_STRIP | 12 refills | Status: DC

## 2021-02-22 MED ORDER — METFORMIN HCL 500 MG PO TABS: ORAL_TABLET | ORAL | 4 refills | Status: DC

## 2021-02-22 MED ORDER — ONETOUCH ULTRASOFT LANCETS MISC: 12 refills | Status: DC

## 2021-02-22 NOTE — Progress Notes (Signed)
Refer to telephone note on 02/22/21 for discussion of results.

## 2021-02-22 NOTE — Telephone Encounter (Signed)
Pt scheduled for f/u appt.

## 2021-02-22 NOTE — Telephone Encounter (Signed)
Called and spoke to patient on telephone to review recent labs results -- new diagnosis of diabetes discussed with patient with A1c from 6% to now 9.9% -- educated him on this diagnosis, which he reports he figured was coming as drinking lots of Koolaid.  Educated him on Metformin, will send this script in and discussed side effects that can present.  Will also send in glucometer supplies with instructions.  Plan on follow-up in 4 weeks for new diagnosis.  Reviewed remainder of labs with patient.  Currently on statin, would benefit from low dose ACE or ARB at future visit.

## 2021-02-28 DIAGNOSIS — I340 Nonrheumatic mitral (valve) insufficiency: Secondary | ICD-10-CM | POA: Diagnosis not present

## 2021-02-28 DIAGNOSIS — E782 Mixed hyperlipidemia: Secondary | ICD-10-CM | POA: Diagnosis not present

## 2021-02-28 DIAGNOSIS — I10 Essential (primary) hypertension: Secondary | ICD-10-CM | POA: Diagnosis not present

## 2021-03-17 NOTE — Patient Instructions (Signed)

## 2021-03-22 ENCOUNTER — Encounter: Payer: Self-pay | Admitting: Nurse Practitioner

## 2021-03-22 ENCOUNTER — Other Ambulatory Visit: Payer: Self-pay

## 2021-03-22 ENCOUNTER — Ambulatory Visit (INDEPENDENT_AMBULATORY_CARE_PROVIDER_SITE_OTHER): Payer: Medicare Other | Admitting: Nurse Practitioner

## 2021-03-22 VITALS — BP 136/78 | HR 81 | Temp 98.1°F | Ht 69.0 in | Wt 258.2 lb

## 2021-03-22 DIAGNOSIS — I152 Hypertension secondary to endocrine disorders: Secondary | ICD-10-CM | POA: Diagnosis not present

## 2021-03-22 DIAGNOSIS — E669 Obesity, unspecified: Secondary | ICD-10-CM | POA: Diagnosis not present

## 2021-03-22 DIAGNOSIS — E1159 Type 2 diabetes mellitus with other circulatory complications: Secondary | ICD-10-CM

## 2021-03-22 DIAGNOSIS — E1169 Type 2 diabetes mellitus with other specified complication: Secondary | ICD-10-CM

## 2021-03-22 MED ORDER — ONETOUCH VERIO VI STRP: ORAL_STRIP | 12 refills | Status: DC

## 2021-03-22 NOTE — Assessment & Plan Note (Signed)
Chronic, with stable BP readings at home and in office on recheck.  Recommend he monitor BP at least a few mornings a week at home and document.  DASH diet at home.  Continue current medication regimen and adjust as needed.  Labs up to date.  Return in 6 months. ? ?

## 2021-03-22 NOTE — Assessment & Plan Note (Signed)
Diagnosed 02/21/21 with A1c 9.9%.  He is tolerating Metformin well, currently taking 1000 MG BID.  Praised for this.  Checking sugars and these are trending down.  Recommend he check sugars twice a day, first thing in morning with goal <130 and one time two hours after a meal with goal <180.  Educated on diabetic diet and recommend focus on this.  Return in 2 months for A1c check. ?

## 2021-03-22 NOTE — Progress Notes (Signed)
? ?BP 136/78 (BP Location: Left Arm, Patient Position: Sitting, Cuff Size: Large)   Pulse 81   Temp 98.1 ?F (36.7 ?C) (Oral)   Ht '5\' 9"'  (1.753 m)   Wt 258 lb 3.2 oz (117.1 kg)   SpO2 96%   BMI 38.13 kg/m?   ? ?Subjective:  ? ? Patient ID: Ryan Schmidt, male    DOB: 10-30-1955, 66 y.o.   MRN: [id number] ? ?HPI: ?Ryan Schmidt is a 66 y.o. male ? ?Chief Complaint  ?Patient presents with  ? Diabetes  ?  Patient states he had issues with his machine at first and he has used a lot of his testing strips and he is requesting a refill on his strips at today's visit. Patient states he is scheduled to see his Eye Doctor on 04/25/21 for an eye exam.  ? ?DIABETES ?New diagnosis on 02/21/21 with A1c 9.9%.  Started on Metformin, is taking 1000 MG BID -- initially had diarrhea which has improved. ?Hypoglycemic episodes:no ?Polydipsia/polyuria: no ?Visual disturbance: no ?Chest pain: no ?Paresthesias: no ?Glucose Monitoring: yes ? Accucheck frequency: Daily ? Fasting glucose: initially higher, but now coming down, had one 99 -- 100 to 150 ? Post prandial: ? Evening: 135 last night ? Before meals: ?Taking Insulin?: no ? Long acting insulin: ? Short acting insulin: ?Blood Pressure Monitoring: a few times a week ?Retinal Examination: Up to Date scheduled with Dr. Joya San on 04/25/21 ?Foot Exam: Up to Date ?Pneumovax: Up to Date ?Influenza: Up to Date ?Aspirin: no  ? ?Relevant past medical, surgical, family and social history reviewed and updated as indicated. Interim medical history since our last visit reviewed. ?Allergies and medications reviewed and updated. ? ?Review of Systems  ?Constitutional:  Negative for activity change, diaphoresis, fatigue and fever.  ?Respiratory:  Negative for cough, chest tightness, shortness of breath and wheezing.   ?Cardiovascular:  Negative for chest pain, palpitations and leg swelling.  ?Gastrointestinal: Negative.   ?Neurological: Negative.   ?Psychiatric/Behavioral: Negative.    ? ?Per HPI unless  specifically indicated above ? ?   ?Objective:  ?  ?BP 136/78 (BP Location: Left Arm, Patient Position: Sitting, Cuff Size: Large)   Pulse 81   Temp 98.1 ?F (36.7 ?C) (Oral)   Ht '5\' 9"'  (1.753 m)   Wt 258 lb 3.2 oz (117.1 kg)   SpO2 96%   BMI 38.13 kg/m?   ?Wt Readings from Last 3 Encounters:  ?03/22/21 258 lb 3.2 oz (117.1 kg)  ?02/21/21 250 lb 6.4 oz (113.6 kg)  ?10/26/20 257 lb (116.6 kg)  ?  ?Physical Exam ?Vitals and nursing note reviewed.  ?Constitutional:   ?   General: He is awake. He is not in acute distress. ?   Appearance: He is well-developed and well-groomed. He is obese. He is not ill-appearing or toxic-appearing.  ?HENT:  ?   Head: Normocephalic and atraumatic.  ?   Right Ear: Hearing normal. No drainage.  ?   Left Ear: Hearing normal. No drainage.  ?Eyes:  ?   General: Lids are normal.     ?   Right eye: No discharge.     ?   Left eye: No discharge.  ?   Conjunctiva/sclera: Conjunctivae normal.  ?   Pupils: Pupils are equal, round, and reactive to light.  ?Neck:  ?   Thyroid: No thyromegaly.  ?   Vascular: No carotid bruit.  ?Cardiovascular:  ?   Rate and Rhythm: Normal rate and regular rhythm.  ?  Heart sounds: Normal heart sounds, S1 normal and S2 normal. No murmur heard. ?  No gallop.  ?Pulmonary:  ?   Effort: Pulmonary effort is normal. No accessory muscle usage or respiratory distress.  ?   Breath sounds: Normal breath sounds.  ?Abdominal:  ?   General: Bowel sounds are normal.  ?   Palpations: Abdomen is soft. There is no hepatomegaly or splenomegaly.  ?Musculoskeletal:     ?   General: Normal range of motion.  ?   Cervical back: Normal range of motion and neck supple.  ?   Right lower leg: No edema.  ?   Left lower leg: No edema.  ?Lymphadenopathy:  ?   Cervical: No cervical adenopathy.  ?Skin: ?   General: Skin is warm and dry.  ?   Capillary Refill: Capillary refill takes less than 2 seconds.  ?Neurological:  ?   Mental Status: He is alert and oriented to person, place, and time.  ?    Deep Tendon Reflexes: Reflexes are normal and symmetric.  ?Psychiatric:     ?   Attention and Perception: Attention normal.     ?   Mood and Affect: Mood normal.     ?   Speech: Speech normal.     ?   Behavior: Behavior normal. Behavior is cooperative.     ?   Thought Content: Thought content normal.  ? ?Diabetic Foot Exam - Simple   ?Simple Foot Form ?Visual Inspection ?No deformities, no ulcerations, no other skin breakdown bilaterally: Yes ?Sensation Testing ?Intact to touch and monofilament testing bilaterally: Yes ?Pulse Check ?Posterior Tibialis and Dorsalis pulse intact bilaterally: Yes ?Comments ?  ?  ?Results for orders placed or performed in visit on 02/21/21  ?Bayer DCA Hb A1c Waived  ?Result Value Ref Range  ? HB A1C (BAYER DCA - WAIVED) 9.9 (H) 4.8 - 5.6 %  ?CBC with Differential/Platelet  ?Result Value Ref Range  ? WBC 5.4 3.4 - 10.8 x10E3/uL  ? RBC 4.91 4.14 - 5.80 x10E6/uL  ? Hemoglobin 15.4 13.0 - 17.7 g/dL  ? Hematocrit 46.8 37.5 - 51.0 %  ? MCV 95 79 - 97 fL  ? MCH 31.4 26.6 - 33.0 pg  ? MCHC 32.9 31.5 - 35.7 g/dL  ? RDW 12.6 11.6 - 15.4 %  ? Platelets 226 150 - 450 x10E3/uL  ? Neutrophils 36 Not Estab. %  ? Lymphs 53 Not Estab. %  ? Monocytes 7 Not Estab. %  ? Eos 3 Not Estab. %  ? Basos 1 Not Estab. %  ? Neutrophils Absolute 2.0 1.4 - 7.0 x10E3/uL  ? Lymphocytes Absolute 2.9 0.7 - 3.1 x10E3/uL  ? Monocytes Absolute 0.4 0.1 - 0.9 x10E3/uL  ? EOS (ABSOLUTE) 0.2 0.0 - 0.4 x10E3/uL  ? Basophils Absolute 0.1 0.0 - 0.2 x10E3/uL  ? Immature Granulocytes 0 Not Estab. %  ? Immature Grans (Abs) 0.0 0.0 - 0.1 x10E3/uL  ?Comprehensive metabolic panel  ?Result Value Ref Range  ? Glucose 353 (H) 70 - 99 mg/dL  ? BUN 21 8 - 27 mg/dL  ? Creatinine, Ser 1.17 0.76 - 1.27 mg/dL  ? eGFR 69 >59 mL/min/1.73  ? BUN/Creatinine Ratio 18 10 - 24  ? Sodium 132 (L) 134 - 144 mmol/L  ? Potassium 4.3 3.5 - 5.2 mmol/L  ? Chloride 92 (L) 96 - 106 mmol/L  ? CO2 24 20 - 29 mmol/L  ? Calcium 9.6 8.6 - 10.2 mg/dL  ? Total Protein  8.2  6.0 - 8.5 g/dL  ? Albumin 4.8 3.8 - 4.8 g/dL  ? Globulin, Total 3.4 1.5 - 4.5 g/dL  ? Albumin/Globulin Ratio 1.4 1.2 - 2.2  ? Bilirubin Total 0.4 0.0 - 1.2 mg/dL  ? Alkaline Phosphatase 167 (H) 44 - 121 IU/L  ? AST 42 (H) 0 - 40 IU/L  ? ALT 55 (H) 0 - 44 IU/L  ?Lipid Panel w/o Chol/HDL Ratio  ?Result Value Ref Range  ? Cholesterol, Total 144 100 - 199 mg/dL  ? Triglycerides 325 (H) 0 - 149 mg/dL  ? HDL 32 (L) >39 mg/dL  ? VLDL Cholesterol Cal 51 (H) 5 - 40 mg/dL  ? LDL Chol Calc (NIH) 61 0 - 99 mg/dL  ?TSH  ?Result Value Ref Range  ? TSH 2.610 0.450 - 4.500 uIU/mL  ?VITAMIN D 25 Hydroxy (Vit-D Deficiency, Fractures)  ?Result Value Ref Range  ? Vit D, 25-Hydroxy 31.2 30.0 - 100.0 ng/mL  ?PSA  ?Result Value Ref Range  ? Prostate Specific Ag, Serum 0.8 0.0 - 4.0 ng/mL  ?Vitamin B12  ?Result Value Ref Range  ? Vitamin B-12 858 232 - 1,245 pg/mL  ?Microalbumin, Urine Waived  ?Result Value Ref Range  ? Microalb, Ur Waived 30 (H) 0 - 19 mg/L  ? Creatinine, Urine Waived 200 10 - 300 mg/dL  ? Microalb/Creat Ratio <30 <30 mg/g  ? ?   ?Assessment & Plan:  ? ?Problem List Items Addressed This Visit   ? ?  ? Cardiovascular and Mediastinum  ? Hypertension associated with diabetes ([place])  ?  Chronic, with stable BP readings at home and in office on recheck.  Recommend he monitor BP at least a few mornings a week at home and document.  DASH diet at home.  Continue current medication regimen and adjust as needed.  Labs up to date.  Return in 6 months. ? ?  ?  ?  ? Endocrine  ? Type 2 diabetes mellitus with obesity ([place]) - Primary  ?  Diagnosed 02/21/21 with A1c 9.9%.  He is tolerating Metformin well, currently taking 1000 MG BID.  Praised for this.  Checking sugars and these are trending down.  Recommend he check sugars twice a day, first thing in morning with goal <130 and one time two hours after a meal with goal <180.  Educated on diabetic diet and recommend focus on this.  Return in 2 months for A1c check. ?  ?  ?  ? Other  ?  Morbid obesity ([place])  ?  BMI 38.13.  Recommended eating smaller high protein, low fat meals more frequently and exercising 30 mins a day 5 times a week with a goal of 10-15lb weight loss in the next 3 months. Fraser Din

## 2021-03-22 NOTE — Assessment & Plan Note (Signed)
BMI 38.13.  Recommended eating smaller high protein, low fat meals more frequently and exercising 30 mins a day 5 times a week with a goal of 10-15lb weight loss in the next 3 months. Patient voiced their understanding and motivation to adhere to these recommendations.  

## 2021-03-25 ENCOUNTER — Telehealth: Payer: Self-pay | Admitting: Nurse Practitioner

## 2021-03-25 DIAGNOSIS — E1169 Type 2 diabetes mellitus with other specified complication: Secondary | ICD-10-CM

## 2021-03-25 NOTE — Telephone Encounter (Signed)
Copied from [place] [id number]. Topic: Quick Communication - Rx Refill/Question ?>> Mar 25, 2021  2:59 PM Lenon Curt, [name] wrote: ?Medication: glucose blood (ONETOUCH VERIO) test strip [id number]  ? ?Has the patient contacted their pharmacy? No. The patient previously requested the refill from their PCP  ?(Agent: If no, request that the patient contact the pharmacy for the refill. If patient does not wish to contact the pharmacy document the reason why and proceed with request.) ?(Agent: If yes, when and what did the pharmacy advise?) ? ?Preferred Pharmacy (with phone number or street name): [facility] [place] [place] ?[place] [address] ?Phone: [phone] Fax: [phone] ?Hours: Not open 24 hours ? ?Has the patient been seen for an appointment in the last year OR does the patient have an upcoming appointment? Yes.   ? ?Agent: Please be advised that RX refills may take up to 3 business days. We ask that you follow-up with your pharmacy. ?

## 2021-03-27 NOTE — Telephone Encounter (Signed)
Requested medication (s) are due for refill today: 04/02/21 ? ?Requested medication (s) are on the active medication list: yes ? ?Last refill:  03/22/21 #100 each 12 refills ? ?Future visit scheduled: yes in 1 month ? ?Notes to clinic:  insurance will not cover early refill unless ordered or called in to order checking glucose 3 times a day. Insurance will not cover early refill until 04/02/21 . Please advise due to early refill  request and pt ran out due to learning how to use machine.  ? ? ?  ?Requested Prescriptions  ?Pending Prescriptions Disp Refills  ? glucose blood (ONETOUCH VERIO) test strip 100 each 12  ?  Sig: Use to check blood sugar 2 times a day and document results, bring to appointments.  Goal is <130 fasting blood sugar and <180 two hours after meals.  ?  ? Endocrinology: Diabetes - Testing Supplies Passed - 03/25/2021  5:32 PM  ?  ?  Passed - Valid encounter within last 12 months  ?  Recent Outpatient Visits   ? ?      ? 5 days ago Type 2 diabetes mellitus with obesity (HCC)  ? [facility], [place] T, NP  ? 1 month ago Neuromuscular disorder [facility])  ? [facility] [place], [place] T, NP  ? 6 months ago Neuromuscular disorder [facility])  ? [facility] [place], [place] T, NP  ? 10 months ago Rash  ? [facility] [place], Megan P, DO  ? 11 months ago Poison oak  ? [facility] Larae Grooms, NP  ? ?  ?  ?Future Appointments   ? ?        ? In 1 month Cannady, Dorie Rank, NP [facility]  ? In 4 months Cannady, Dorie Rank, NP [facility]  ? In 7 months  [facility]  ? ?  ? ?  ?  ?  ? ?

## 2021-03-27 NOTE — Telephone Encounter (Signed)
Contacted pharmacy regarding test strips. Pharmacy reports insurance will not cover payment for early refills. Insurance will only cover if checking glucose 3 x a day. Insurance will cover after 04/02/21 at this time.  ?

## 2021-03-28 MED ORDER — ONETOUCH VERIO VI STRP: ORAL_STRIP | 4 refills | Status: DC

## 2021-03-28 NOTE — Addendum Note (Signed)
Addended by: Aura Dials T on: 03/28/2021 04:59 PM ? ? Modules accepted: Orders ? ?

## 2021-03-28 NOTE — Telephone Encounter (Signed)
Pharmacy advised patient to contact PCP and request a new script to reflect him checking his blood sugar 3x a day because the monitor is sensitive and causes an error. Pharmacy states without a new script they will not be able to refill his test strips. Patient states he has 4 test strips left.  ? ?[facility] [place] [place] - [address] Phone:  [phone]  ?Fax:  [phone]  ?  ? ?

## 2021-03-29 NOTE — Telephone Encounter (Signed)
Called patient to notify rx was sent in NA/LVM advising patient to call back if he continues to have issues.  ?

## 2021-04-02 DIAGNOSIS — I340 Nonrheumatic mitral (valve) insufficiency: Secondary | ICD-10-CM | POA: Diagnosis not present

## 2021-04-30 LAB — HM DIABETES EYE EXAM

## 2021-05-01 DIAGNOSIS — H35033 Hypertensive retinopathy, bilateral: Secondary | ICD-10-CM | POA: Diagnosis not present

## 2021-05-19 NOTE — Patient Instructions (Signed)

## 2021-05-22 ENCOUNTER — Ambulatory Visit (INDEPENDENT_AMBULATORY_CARE_PROVIDER_SITE_OTHER): Payer: Medicare Other | Admitting: Nurse Practitioner

## 2021-05-22 ENCOUNTER — Encounter: Payer: Self-pay | Admitting: Nurse Practitioner

## 2021-05-22 VITALS — BP 121/73 | HR 80 | Temp 98.3°F | Ht 69.0 in | Wt 259.8 lb

## 2021-05-22 DIAGNOSIS — E669 Obesity, unspecified: Secondary | ICD-10-CM

## 2021-05-22 DIAGNOSIS — I340 Nonrheumatic mitral (valve) insufficiency: Secondary | ICD-10-CM | POA: Diagnosis not present

## 2021-05-22 DIAGNOSIS — R809 Proteinuria, unspecified: Secondary | ICD-10-CM | POA: Diagnosis not present

## 2021-05-22 DIAGNOSIS — E1169 Type 2 diabetes mellitus with other specified complication: Secondary | ICD-10-CM | POA: Diagnosis not present

## 2021-05-22 DIAGNOSIS — E1159 Type 2 diabetes mellitus with other circulatory complications: Secondary | ICD-10-CM

## 2021-05-22 DIAGNOSIS — E119 Type 2 diabetes mellitus without complications: Secondary | ICD-10-CM

## 2021-05-22 DIAGNOSIS — G709 Myoneural disorder, unspecified: Secondary | ICD-10-CM

## 2021-05-22 DIAGNOSIS — E785 Hyperlipidemia, unspecified: Secondary | ICD-10-CM

## 2021-05-22 DIAGNOSIS — I152 Hypertension secondary to endocrine disorders: Secondary | ICD-10-CM | POA: Diagnosis not present

## 2021-05-22 DIAGNOSIS — E1129 Type 2 diabetes mellitus with other diabetic kidney complication: Secondary | ICD-10-CM | POA: Diagnosis not present

## 2021-05-22 LAB — BAYER DCA HB A1C WAIVED: HB A1C (BAYER DCA - WAIVED): 5.8 % — ABNORMAL HIGH (ref 4.8–5.6)

## 2021-05-22 MED ORDER — METFORMIN HCL ER 500 MG PO TB24
500.0000 mg | ORAL_TABLET | Freq: Two times a day (BID) | ORAL | 4 refills | Status: DC
Start: 2021-05-22 — End: 2022-04-02

## 2021-05-22 NOTE — Assessment & Plan Note (Signed)
Diagnosed 02/21/21 with A1c 9.9%, today is 5.8%!!  Praised for this.  Urine ALB 30 in February 2023, consider adding ACE or ARB next visit.  Will change Metformin to XR dosing and 500 MG BID.  Checking sugars and these are trending down.  Recommend he check sugars twice a day, first thing in morning with goal <130 and one time two hours after a meal with goal <180.  Educated on diabetic diet and recommend focus on this.  Return in 6 months. ?

## 2021-05-22 NOTE — Progress Notes (Signed)
? ?BP 121/73   Pulse 80   Temp 98.3 ?F (36.8 ?C) (Oral)   Ht 5\' 9"  (1.753 m)   Wt 259 lb 12.8 oz (117.8 kg)   SpO2 94%   BMI 38.37 kg/m?   ? ?Subjective:  ? ? Patient ID: Ryan Schmidt, male    DOB: July 07, 1955, 66 y.o.   MRN: [id number] ? ?HPI: ?Ryan Schmidt is a 66 y.o. male ? ?Chief Complaint  ?Patient presents with  ? Diabetes  ? Hyperlipidemia  ? Hypertension  ? Hand Problem  ?  Patient says his R hand has been bothering him. Patient states the pain is constant in his joints and knuckle area. Patient denies trying any medication to help with pain/discomfort.  ? ?DIABETES ?New diagnosis on 02/21/21 with A1c 9.9%.  Started on Metformin, is taking 1000 MG BID -- continues to tolerate this okay, occasional diarrhea.  Continues to drink occasional 02/23/21.  Is trying to decrease bread intake.   ?Hypoglycemic episodes:no ?Polydipsia/polyuria: no ?Visual disturbance: no ?Chest pain: no ?Paresthesias: no ?Glucose Monitoring: yes ? Accucheck frequency: Daily ? Fasting glucose: 120 range ? Post prandial: ? Evening: 140 range ? Before meals: ?Taking Insulin?: no ? Long acting insulin: ? Short acting insulin: ?Blood Pressure Monitoring: a few times a week ?Retinal Examination: Up to Date scheduled with Dr. [employer] on 04/25/21 ?Foot Exam: Up to Date ?Pneumovax: Up to Date ?Influenza: Up to Date ?Aspirin: no  ? ?HYPERTENSION / HYPERLIPIDEMIA ?Continues on HCTZ and Atorvastatin daily.  Continues to follow-up with cardiology, last visit 02/28/21.  Last echo was 04/02/21 with moderate MR noted, but normal LV function and EF >50%. ?Satisfied with current treatment? yes ?Duration of hypertension: chronic ?BP monitoring frequency: once a  week ?BP range: 120-130/80 range ?BP medication side effects: no ?Duration of hyperlipidemia: chronic ?Cholesterol medication side effects: no ?Cholesterol supplements: none ?Medication compliance: good compliance ?Aspirin: no ?Recent stressors: no ?Recurrent headaches: no ?Visual changes:  no ?Palpitations: no ?Dyspnea: no ?Chest pain: occasional, but none recent ?Lower extremity edema: no ?Dizzy/lightheaded: no  ? ?HAND PAIN ?To right hand -- started a week ago.  Is right hand dominant.  No hand stiffness in morning.   ?Duration: weeks ?Involved hand: right ?Mechanism of injury:  none ?Location: dorsal ?Onset: sudden ?Severity: 4/10  ?Quality: sharp, aching, and throbbing ?Frequency: intermittent ?Radiation: no ?Aggravating factors: movement ?Alleviating factors: Tylenol ?Treatments attempted: Tylenol ?Relief with NSAIDs?: no ?Weakness: no ?Numbness: no ?Redness: no ?Swelling:no ?Bruising: no ?Fevers: no  ? ?Relevant past medical, surgical, family and social history reviewed and updated as indicated. Interim medical history since our last visit reviewed. ?Allergies and medications reviewed and updated. ? ?Review of Systems  ?Constitutional:  Negative for activity change, diaphoresis, fatigue and fever.  ?Respiratory:  Negative for cough, chest tightness, shortness of breath and wheezing.   ?Cardiovascular:  Negative for chest pain, palpitations and leg swelling.  ?Gastrointestinal: Negative.   ?Neurological: Negative.   ?Psychiatric/Behavioral: Negative.    ? ?Per HPI unless specifically indicated above ? ?   ?Objective:  ?  ?BP 121/73   Pulse 80   Temp 98.3 ?F (36.8 ?C) (Oral)   Ht 5\' 9"  (1.753 m)   Wt 259 lb 12.8 oz (117.8 kg)   SpO2 94%   BMI 38.37 kg/m?   ?Wt Readings from Last 3 Encounters:  ?05/22/21 259 lb 12.8 oz (117.8 kg)  ?03/22/21 258 lb 3.2 oz (117.1 kg)  ?02/21/21 250 lb 6.4 oz (113.6 kg)  ?  ?Physical  Exam ?Vitals and nursing note reviewed.  ?Constitutional:   ?   General: He is awake. He is not in acute distress. ?   Appearance: He is well-developed and well-groomed. He is obese. He is not ill-appearing or toxic-appearing.  ?HENT:  ?   Head: Normocephalic and atraumatic.  ?   Right Ear: Hearing normal. No drainage.  ?   Left Ear: Hearing normal. No drainage.  ?Eyes:  ?    General: Lids are normal.     ?   Right eye: No discharge.     ?   Left eye: No discharge.  ?   Conjunctiva/sclera: Conjunctivae normal.  ?   Pupils: Pupils are equal, round, and reactive to light.  ?Neck:  ?   Thyroid: No thyromegaly.  ?   Vascular: No carotid bruit.  ?Cardiovascular:  ?   Rate and Rhythm: Normal rate and regular rhythm.  ?   Heart sounds: Normal heart sounds, S1 normal and S2 normal. No murmur heard. ?  No gallop.  ?Pulmonary:  ?   Effort: Pulmonary effort is normal. No accessory muscle usage or respiratory distress.  ?   Breath sounds: Normal breath sounds.  ?Abdominal:  ?   General: Bowel sounds are normal.  ?   Palpations: Abdomen is soft. There is no hepatomegaly or splenomegaly.  ?Musculoskeletal:     ?   General: Normal range of motion.  ?   Cervical back: Normal range of motion and neck supple.  ?   Right lower leg: No edema.  ?   Left lower leg: No edema.  ?Lymphadenopathy:  ?   Cervical: No cervical adenopathy.  ?Skin: ?   General: Skin is warm and dry.  ?   Capillary Refill: Capillary refill takes less than 2 seconds.  ?Neurological:  ?   Mental Status: He is alert and oriented to person, place, and time.  ?   Deep Tendon Reflexes: Reflexes are normal and symmetric.  ?Psychiatric:     ?   Attention and Perception: Attention normal.     ?   Mood and Affect: Mood normal.     ?   Speech: Speech normal.     ?   Behavior: Behavior normal. Behavior is cooperative.     ?   Thought Content: Thought content normal.  ? ?Results for orders placed or performed in visit on 05/22/21  ?Bayer DCA Hb A1c Waived  ?Result Value Ref Range  ? HB A1C (BAYER DCA - WAIVED) 5.8 (H) 4.8 - 5.6 %  ? ?   ?Assessment & Plan:  ? ?Problem List Items Addressed This Visit   ? ?  ? Cardiovascular and Mediastinum  ? Hypertension associated with diabetes (HCC)  ?  Chronic, with stable BP readings at home and in office.  Consider addition of ACE or ARB in future for diabetes.  Recommend he monitor BP at least a few mornings  a week at home and document.  DASH diet at home.  Continue current medication regimen and adjust as needed. Labs today: BMP today.  Return in 6 months. ? ? ?  ?  ? Relevant Medications  ? metFORMIN (GLUCOPHAGE-XR) 500 MG 24 hr tablet  ? Other Relevant Orders  ? Bayer DCA Hb A1c Waived (Completed)  ? Basic metabolic panel  ? Nonrheumatic mitral valve regurgitation  ?  Noted on echo, continue collaboration with cardiology. ? ?  ?  ?  ? Endocrine  ? Hyperlipidemia associated with type 2 diabetes  mellitus (HCC)  ?  Chronic, ongoing.  Continue statin daily and adjust dosing as needed. Lipid panel up to date. ? ?  ?  ? Relevant Medications  ? metFORMIN (GLUCOPHAGE-XR) 500 MG 24 hr tablet  ? Other Relevant Orders  ? Bayer DCA Hb A1c Waived (Completed)  ? Type 2 diabetes mellitus with obesity (HCC) - Primary  ?  Diagnosed 02/21/21 with A1c 9.9%, today is 5.8%!!  Praised for this.  Will change Metformin to XR dosing and 500 MG BID.  Checking sugars and these are trending down.  Recommend he check sugars twice a day, first thing in morning with goal <130 and one time two hours after a meal with goal <180.  Educated on diabetic diet and recommend focus on this.  Return in 6 months. ? ?  ?  ? Relevant Medications  ? metFORMIN (GLUCOPHAGE-XR) 500 MG 24 hr tablet  ? Other Relevant Orders  ? Bayer DCA Hb A1c Waived (Completed)  ? Basic metabolic panel  ? Type 2 diabetes mellitus with proteinuria (HCC)  ?  Diagnosed 02/21/21 with A1c 9.9%, today is 5.8%!!  Praised for this.  Urine ALB 30 in February 2023, consider adding ACE or ARB next visit.  Will change Metformin to XR dosing and 500 MG BID.  Checking sugars and these are trending down.  Recommend he check sugars twice a day, first thing in morning with goal <130 and one time two hours after a meal with goal <180.  Educated on diabetic diet and recommend focus on this.  Return in 6 months. ?  ?  ? Relevant Medications  ? metFORMIN (GLUCOPHAGE-XR) 500 MG 24 hr tablet  ? Other  Relevant Orders  ? Bayer DCA Hb A1c Waived (Completed)  ? Basic metabolic panel  ?  ? Other  ? Morbid obesity (HCC)  ?  BMI 38.37.  Recommended eating smaller high protein, low fat meals more frequently and exercising 30 mins a day

## 2021-05-22 NOTE — Assessment & Plan Note (Signed)
Diagnosed 02/21/21 with A1c 9.9%, today is 5.8%!!  Praised for this.  Will change Metformin to XR dosing and 500 MG BID.  Checking sugars and these are trending down.  Recommend he check sugars twice a day, first thing in morning with goal <130 and one time two hours after a meal with goal <180.  Educated on diabetic diet and recommend focus on this.  Return in 6 months. ?

## 2021-05-22 NOTE — Assessment & Plan Note (Signed)
BMI 38.37.  Recommended eating smaller high protein, low fat meals more frequently and exercising 30 mins a day 5 times a week with a goal of 10-15lb weight loss in the next 3 months. Patient voiced their understanding and motivation to adhere to these recommendations. ? ?

## 2021-05-22 NOTE — Assessment & Plan Note (Signed)
Noted on echo, continue collaboration with cardiology.   

## 2021-05-22 NOTE — Assessment & Plan Note (Addendum)
Chronic, with stable BP readings at home and in office.  Consider addition of ACE or ARB in future for diabetes.  Recommend he monitor BP at least a few mornings a week at home and document.  DASH diet at home.  Continue current medication regimen and adjust as needed. Labs today: BMP today.  Return in 6 months. ? ?

## 2021-05-22 NOTE — Assessment & Plan Note (Signed)
Chronic, ongoing.  Continue statin daily and adjust dosing as needed. Lipid panel up to date. ?

## 2021-05-23 LAB — BASIC METABOLIC PANEL
BUN/Creatinine Ratio: 14 (ref 10–24)
BUN: 13 mg/dL (ref 8–27)
CO2: 25 mmol/L (ref 20–29)
Calcium: 9.6 mg/dL (ref 8.6–10.2)
Chloride: 98 mmol/L (ref 96–106)
Creatinine, Ser: 0.96 mg/dL (ref 0.76–1.27)
Glucose: 110 mg/dL — ABNORMAL HIGH (ref 70–99)
Potassium: 4.1 mmol/L (ref 3.5–5.2)
Sodium: 138 mmol/L (ref 134–144)
eGFR: 87 mL/min/{1.73_m2} (ref >59)

## 2021-05-23 NOTE — Progress Notes (Signed)
Good morning crew, please let Hosteen know his kidney function and electrolytes have returned and remain stable.  No changes needed!!  Keep up the great work.   Keep being stellar!!  Thank you for allowing me to participate in your care.  I appreciate you. Kindest regards, Jurrell Royster

## 2021-06-05 DIAGNOSIS — I340 Nonrheumatic mitral (valve) insufficiency: Secondary | ICD-10-CM | POA: Diagnosis not present

## 2021-06-05 DIAGNOSIS — I10 Essential (primary) hypertension: Secondary | ICD-10-CM | POA: Diagnosis not present

## 2021-06-05 DIAGNOSIS — E782 Mixed hyperlipidemia: Secondary | ICD-10-CM | POA: Diagnosis not present

## 2021-07-31 IMAGING — DX DG KNEE 1-2V*L*
2 series · 2 of 2 positions shown · non-contrast
Comparison: None.

CLINICAL DATA: Status post left knee arthroplasty today.

EXAM:
LEFT KNEE - 1-2 VIEW

[knee ap]
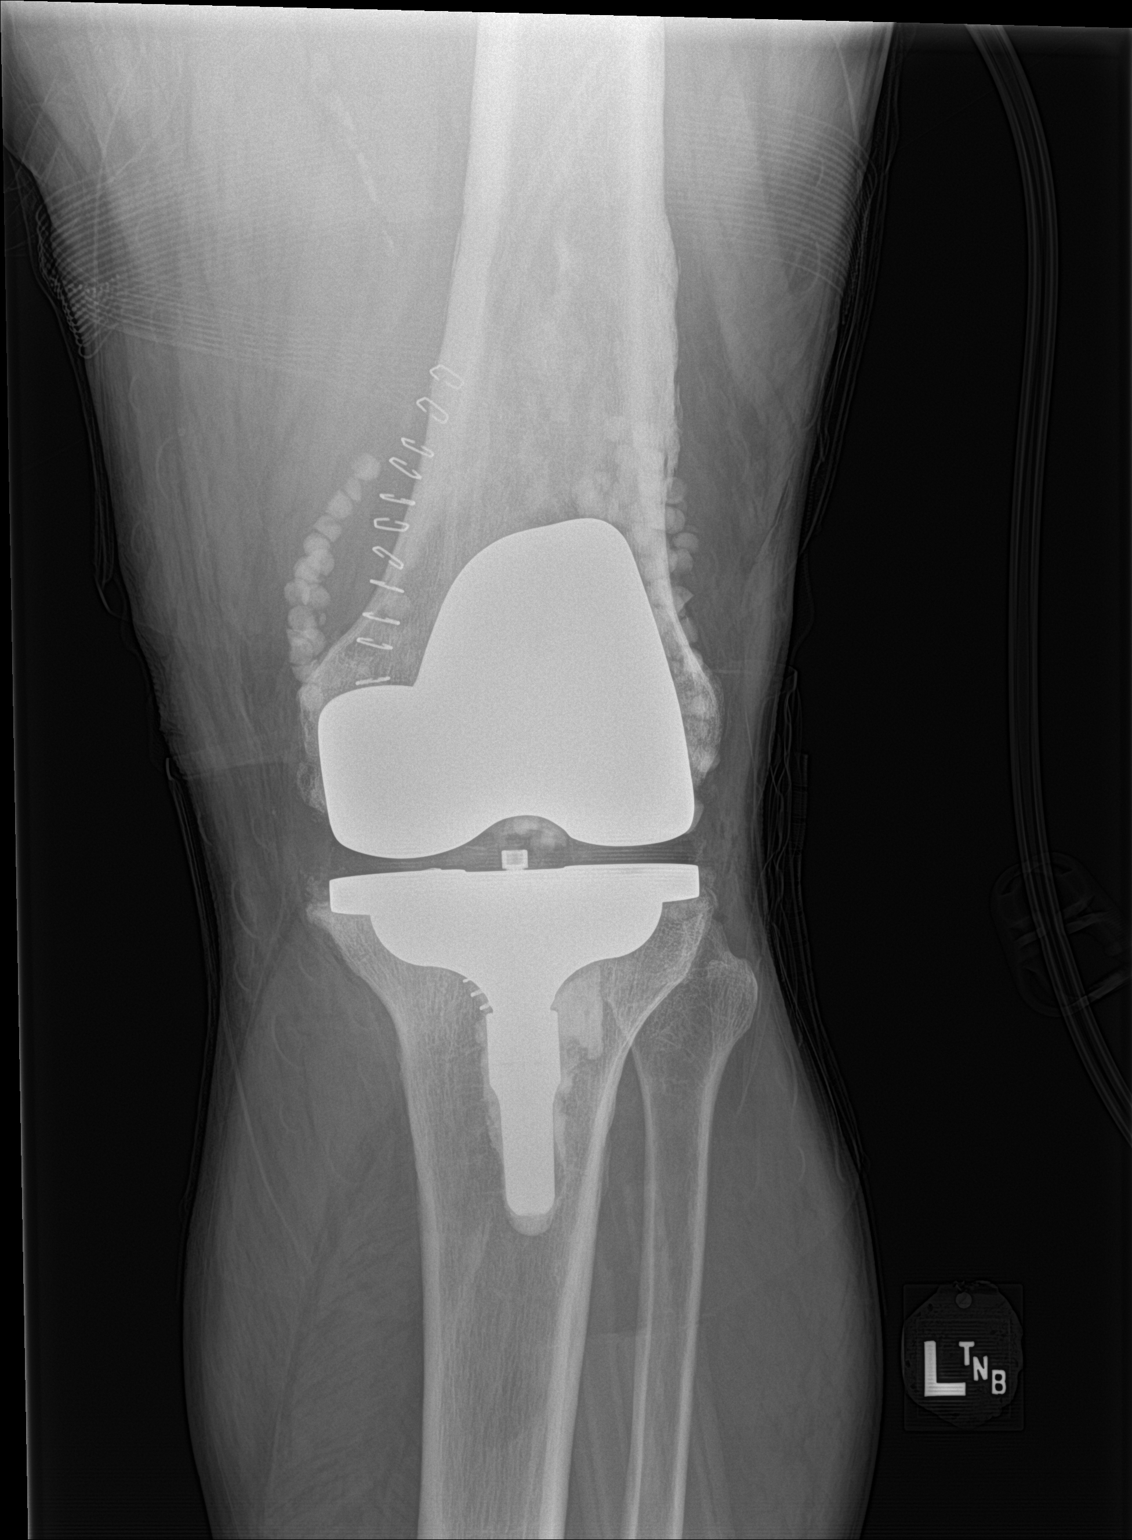

[knee lat]
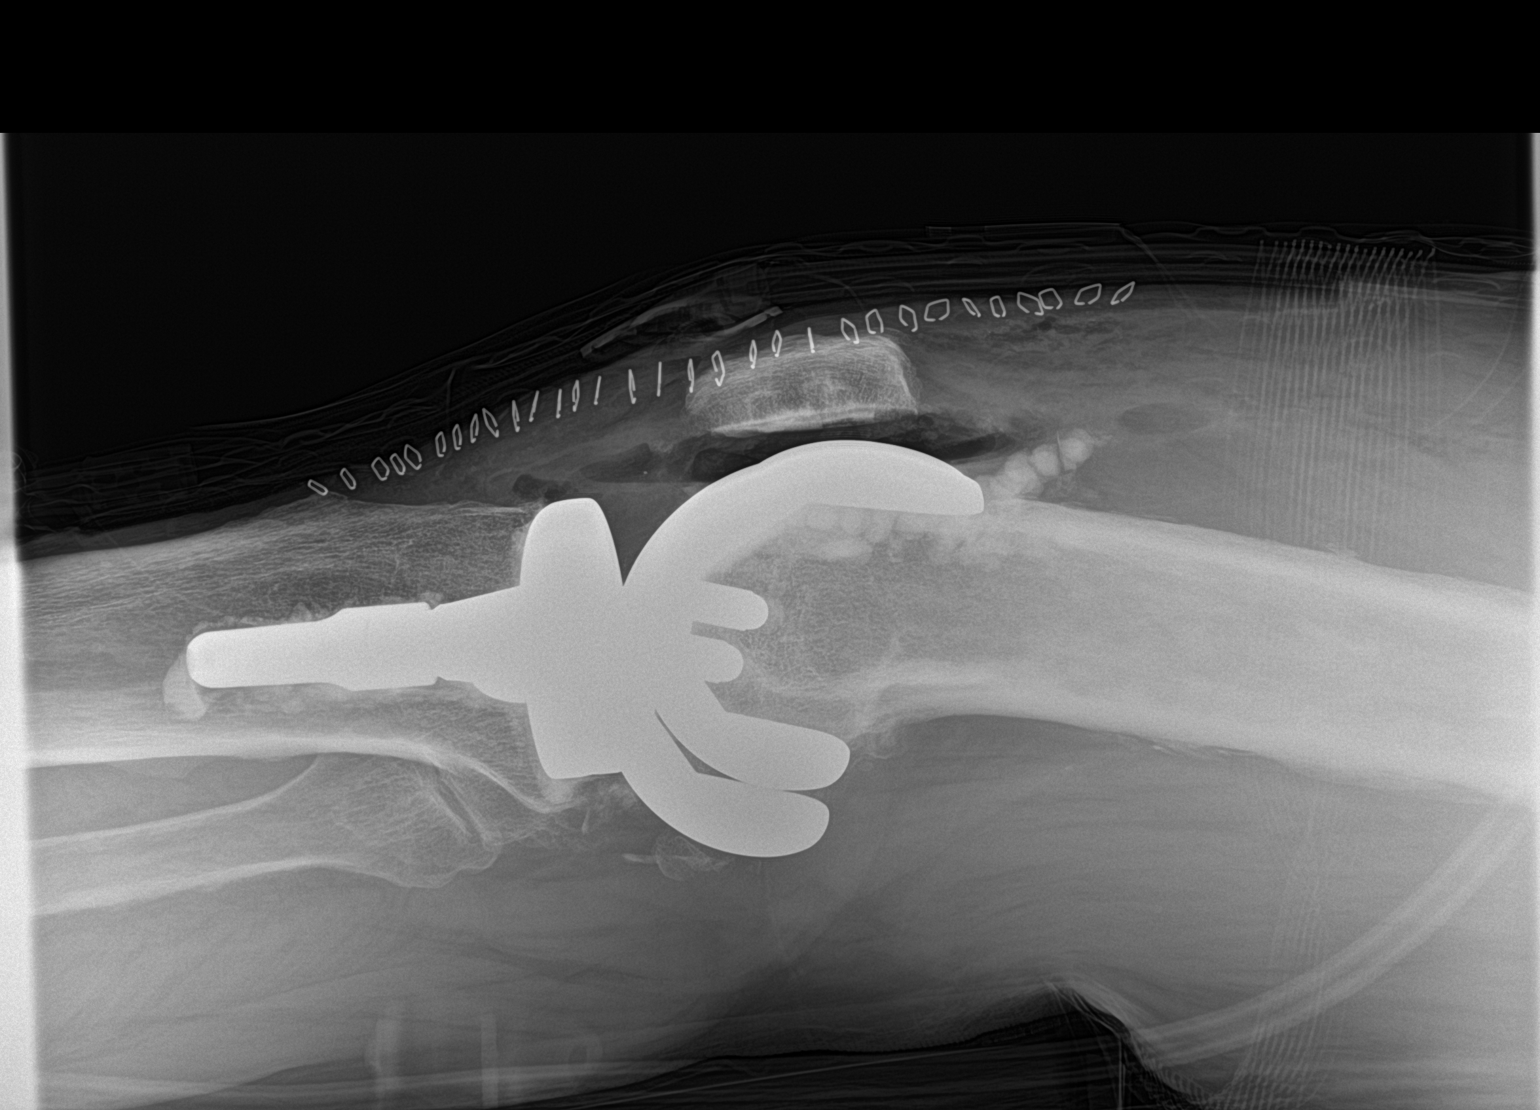

[2 of 2 positions shown; findings below may reference images not displayed]

FINDINGS: New left total knee arthroplasty is in place. No fracture is
identified. Rounded dense structures about the distal femur may be
segment. There is a small joint effusion. No acute or focal bony
abnormality.
IMPRESSION: Status post left knee arthroplasty. Negative for fracture or other
acute abnormality. Rounded high-density structures about the distal
femur are indeterminate but may be cement.

## 2021-08-21 ENCOUNTER — Ambulatory Visit: Payer: Medicare Other | Admitting: Nurse Practitioner

## 2021-10-25 ENCOUNTER — Ambulatory Visit: Payer: Medicare Other

## 2021-10-28 ENCOUNTER — Ambulatory Visit: Payer: Medicare Other

## 2021-11-17 NOTE — Patient Instructions (Incomplete)
Diabetes Mellitus Basics  Diabetes mellitus, or diabetes, is a long-term (chronic) disease. It occurs when the body does not properly use sugar (glucose) that is released from food after you eat. Diabetes mellitus may be caused by one or both of these problems: Your pancreas does not make enough of a hormone called insulin. Your body does not react in a normal way to the insulin that it makes. Insulin lets glucose enter cells in your body. This gives you energy. If you have diabetes, glucose cannot get into cells. This causes high blood glucose (hyperglycemia). How to treat and manage diabetes You may need to take insulin or other diabetes medicines daily to keep your glucose in balance. If you are prescribed insulin, you will learn how to give yourself insulin by injection. You may need to adjust the amount of insulin you take based on the foods that you eat. You will need to check your blood glucose levels using a glucose monitor as told by your health care provider. The readings can help determine if you have low or high blood glucose. Generally, you should have these blood glucose levels: Before meals (preprandial): 80-130 mg/dL (4.4-7.2 mmol/L). After meals (postprandial): below 180 mg/dL (10 mmol/L). Hemoglobin A1c (HbA1c) level: less than 7%. Your health care provider will set treatment goals for you. Keep all follow-up visits. This is important. Follow these instructions at home: Diabetes medicines Take your diabetes medicines every day as told by your health care provider. List your diabetes medicines here: Name of medicine: ______________________________ Amount (dose): _______________ Time (a.m./p.m.): _______________ Notes: ___________________________________ Name of medicine: ______________________________ Amount (dose): _______________ Time (a.m./p.m.): _______________ Notes: ___________________________________ Name of medicine: ______________________________ Amount (dose):  _______________ Time (a.m./p.m.): _______________ Notes: ___________________________________ Insulin If you use insulin, list the types of insulin you use here: Insulin type: ______________________________ Amount (dose): _______________ Time (a.m./p.m.): _______________Notes: ___________________________________ Insulin type: ______________________________ Amount (dose): _______________ Time (a.m./p.m.): _______________ Notes: ___________________________________ Insulin type: ______________________________ Amount (dose): _______________ Time (a.m./p.m.): _______________ Notes: ___________________________________ Insulin type: ______________________________ Amount (dose): _______________ Time (a.m./p.m.): _______________ Notes: ___________________________________ Insulin type: ______________________________ Amount (dose): _______________ Time (a.m./p.m.): _______________ Notes: ___________________________________ Managing blood glucose  Check your blood glucose levels using a glucose monitor as told by your health care provider. Write down the times that you check your glucose levels here: Time: _______________ Notes: ___________________________________ Time: _______________ Notes: ___________________________________ Time: _______________ Notes: ___________________________________ Time: _______________ Notes: ___________________________________ Time: _______________ Notes: ___________________________________ Time: _______________ Notes: ___________________________________  Low blood glucose Low blood glucose (hypoglycemia) is when glucose is at or below 70 mg/dL (3.9 mmol/L). Symptoms may include: Feeling: Hungry. Sweaty and clammy. Irritable or easily upset. Dizzy. Sleepy. Having: A fast heartbeat. A headache. A change in your vision. Numbness around the mouth, lips, or tongue. Having trouble with: Moving (coordination). Sleeping. Treating low blood glucose To treat low blood  glucose, eat or drink something containing sugar right away. If you can think clearly and swallow safely, follow the 15:15 rule: Take 15 grams of a fast-acting carb (carbohydrate), as told by your health care provider. Some fast-acting carbs are: Glucose tablets: take 3-4 tablets. Hard candy: eat 3-5 pieces. Fruit juice: drink 4 oz (120 mL). Regular (not diet) soda: drink 4-6 oz (120-180 mL). Honey or sugar: eat 1 Tbsp (15 mL). Check your blood glucose levels 15 minutes after you take the carb. If your glucose is still at or below 70 mg/dL (3.9 mmol/L), take 15 grams of a carb again. If your glucose does not go above 70 mg/dL (3.9 mmol/L) after   3 tries, get help right away. After your glucose goes back to normal, eat a meal or a snack within 1 hour. Treating very low blood glucose If your glucose is at or below 54 mg/dL (3 mmol/L), you have very low blood glucose (severe hypoglycemia). This is an emergency. Do not wait to see if the symptoms will go away. Get medical help right away. Call your local emergency services (911 in the [place] Do not drive yourself to the hospital. Questions to ask your health care provider Should I talk with a diabetes educator? What equipment will I need to care for myself at home? What diabetes medicines do I need? When should I take them? How often do I need to check my blood glucose levels? What number can I call if I have questions? When is my follow-up visit? Where can I find a support group for people with diabetes? Where to find more information American Diabetes Association: www.diabetes.org Association of Diabetes Care and Education Specialists: www.diabeteseducator.org Contact a health care provider if: Your blood glucose is at or above 240 mg/dL (13.3 mmol/L) for 2 days in a row. You have been sick or have had a fever for 2 days or more, and you are not getting better. You have any of these problems for more than 6 hours: You cannot eat or  drink. You feel nauseous. You vomit. You have diarrhea. Get help right away if: Your blood glucose is lower than 54 mg/dL (3 mmol/L). You get confused. You have trouble thinking clearly. You have trouble breathing. These symptoms may represent a serious problem that is an emergency. Do not wait to see if the symptoms will go away. Get medical help right away. Call your local emergency services (911 in the [place] Do not drive yourself to the hospital. Summary Diabetes mellitus is a chronic disease that occurs when the body does not properly use sugar (glucose) that is released from food after you eat. Take insulin and diabetes medicines as told. Check your blood glucose every day, as often as told. Keep all follow-up visits. This is important. This information is not intended to replace advice given to you by your health care provider. Make sure you discuss any questions you have with your health care provider. Document Revised: 04/26/2019 Document Reviewed: 04/26/2019 Elsevier Patient Education  2023 [employer]  

## 2021-11-22 ENCOUNTER — Ambulatory Visit: Payer: Medicare Other | Admitting: Nurse Practitioner

## 2021-11-22 DIAGNOSIS — I152 Hypertension secondary to endocrine disorders: Secondary | ICD-10-CM

## 2021-11-22 DIAGNOSIS — Z23 Encounter for immunization: Secondary | ICD-10-CM

## 2021-11-22 DIAGNOSIS — I340 Nonrheumatic mitral (valve) insufficiency: Secondary | ICD-10-CM

## 2021-11-22 DIAGNOSIS — E1169 Type 2 diabetes mellitus with other specified complication: Secondary | ICD-10-CM

## 2021-11-22 DIAGNOSIS — E1129 Type 2 diabetes mellitus with other diabetic kidney complication: Secondary | ICD-10-CM

## 2021-11-22 DIAGNOSIS — G709 Myoneural disorder, unspecified: Secondary | ICD-10-CM

## 2021-12-01 NOTE — Patient Instructions (Signed)
Diabetes Mellitus Basics  Diabetes mellitus, or diabetes, is a long-term (chronic) disease. It occurs when the body does not properly use sugar (glucose) that is released from food after you eat. Diabetes mellitus may be caused by one or both of these problems: Your pancreas does not make enough of a hormone called insulin. Your body does not react in a normal way to the insulin that it makes. Insulin lets glucose enter cells in your body. This gives you energy. If you have diabetes, glucose cannot get into cells. This causes high blood glucose (hyperglycemia). How to treat and manage diabetes You may need to take insulin or other diabetes medicines daily to keep your glucose in balance. If you are prescribed insulin, you will learn how to give yourself insulin by injection. You may need to adjust the amount of insulin you take based on the foods that you eat. You will need to check your blood glucose levels using a glucose monitor as told by your health care provider. The readings can help determine if you have low or high blood glucose. Generally, you should have these blood glucose levels: Before meals (preprandial): 80-130 mg/dL (4.4-7.2 mmol/L). After meals (postprandial): below 180 mg/dL (10 mmol/L). Hemoglobin A1c (HbA1c) level: less than 7%. Your health care provider will set treatment goals for you. Keep all follow-up visits. This is important. Follow these instructions at home: Diabetes medicines Take your diabetes medicines every day as told by your health care provider. List your diabetes medicines here: Name of medicine: ______________________________ Amount (dose): _______________ Time (a.m./p.m.): _______________ Notes: ___________________________________ Name of medicine: ______________________________ Amount (dose): _______________ Time (a.m./p.m.): _______________ Notes: ___________________________________ Name of medicine: ______________________________ Amount (dose):  _______________ Time (a.m./p.m.): _______________ Notes: ___________________________________ Insulin If you use insulin, list the types of insulin you use here: Insulin type: ______________________________ Amount (dose): _______________ Time (a.m./p.m.): _______________Notes: ___________________________________ Insulin type: ______________________________ Amount (dose): _______________ Time (a.m./p.m.): _______________ Notes: ___________________________________ Insulin type: ______________________________ Amount (dose): _______________ Time (a.m./p.m.): _______________ Notes: ___________________________________ Insulin type: ______________________________ Amount (dose): _______________ Time (a.m./p.m.): _______________ Notes: ___________________________________ Insulin type: ______________________________ Amount (dose): _______________ Time (a.m./p.m.): _______________ Notes: ___________________________________ Managing blood glucose  Check your blood glucose levels using a glucose monitor as told by your health care provider. Write down the times that you check your glucose levels here: Time: _______________ Notes: ___________________________________ Time: _______________ Notes: ___________________________________ Time: _______________ Notes: ___________________________________ Time: _______________ Notes: ___________________________________ Time: _______________ Notes: ___________________________________ Time: _______________ Notes: ___________________________________  Low blood glucose Low blood glucose (hypoglycemia) is when glucose is at or below 70 mg/dL (3.9 mmol/L). Symptoms may include: Feeling: Hungry. Sweaty and clammy. Irritable or easily upset. Dizzy. Sleepy. Having: A fast heartbeat. A headache. A change in your vision. Numbness around the mouth, lips, or tongue. Having trouble with: Moving (coordination). Sleeping. Treating low blood glucose To treat low blood  glucose, eat or drink something containing sugar right away. If you can think clearly and swallow safely, follow the 15:15 rule: Take 15 grams of a fast-acting carb (carbohydrate), as told by your health care provider. Some fast-acting carbs are: Glucose tablets: take 3-4 tablets. Hard candy: eat 3-5 pieces. Fruit juice: drink 4 oz (120 mL). Regular (not diet) soda: drink 4-6 oz (120-180 mL). Honey or sugar: eat 1 Tbsp (15 mL). Check your blood glucose levels 15 minutes after you take the carb. If your glucose is still at or below 70 mg/dL (3.9 mmol/L), take 15 grams of a carb again. If your glucose does not go above 70 mg/dL (3.9 mmol/L) after   3 tries, get help right away. After your glucose goes back to normal, eat a meal or a snack within 1 hour. Treating very low blood glucose If your glucose is at or below 54 mg/dL (3 mmol/L), you have very low blood glucose (severe hypoglycemia). This is an emergency. Do not wait to see if the symptoms will go away. Get medical help right away. Call your local emergency services (911 in the [place] Do not drive yourself to the hospital. Questions to ask your health care provider Should I talk with a diabetes educator? What equipment will I need to care for myself at home? What diabetes medicines do I need? When should I take them? How often do I need to check my blood glucose levels? What number can I call if I have questions? When is my follow-up visit? Where can I find a support group for people with diabetes? Where to find more information American Diabetes Association: www.diabetes.org Association of Diabetes Care and Education Specialists: www.diabeteseducator.org Contact a health care provider if: Your blood glucose is at or above 240 mg/dL (13.3 mmol/L) for 2 days in a row. You have been sick or have had a fever for 2 days or more, and you are not getting better. You have any of these problems for more than 6 hours: You cannot eat or  drink. You feel nauseous. You vomit. You have diarrhea. Get help right away if: Your blood glucose is lower than 54 mg/dL (3 mmol/L). You get confused. You have trouble thinking clearly. You have trouble breathing. These symptoms may represent a serious problem that is an emergency. Do not wait to see if the symptoms will go away. Get medical help right away. Call your local emergency services (911 in the [place] Do not drive yourself to the hospital. Summary Diabetes mellitus is a chronic disease that occurs when the body does not properly use sugar (glucose) that is released from food after you eat. Take insulin and diabetes medicines as told. Check your blood glucose every day, as often as told. Keep all follow-up visits. This is important. This information is not intended to replace advice given to you by your health care provider. Make sure you discuss any questions you have with your health care provider. Document Revised: 04/26/2019 Document Reviewed: 04/26/2019 Elsevier Patient Education  2023 [employer]  

## 2021-12-04 ENCOUNTER — Ambulatory Visit (INDEPENDENT_AMBULATORY_CARE_PROVIDER_SITE_OTHER): Payer: Medicare Other | Admitting: Nurse Practitioner

## 2021-12-04 ENCOUNTER — Encounter: Payer: Self-pay | Admitting: Nurse Practitioner

## 2021-12-04 ENCOUNTER — Telehealth: Payer: Self-pay

## 2021-12-04 VITALS — BP 126/85 | HR 61 | Temp 98.0°F | Ht 69.02 in | Wt 250.6 lb

## 2021-12-04 DIAGNOSIS — E1169 Type 2 diabetes mellitus with other specified complication: Secondary | ICD-10-CM

## 2021-12-04 DIAGNOSIS — R809 Proteinuria, unspecified: Secondary | ICD-10-CM

## 2021-12-04 DIAGNOSIS — E785 Hyperlipidemia, unspecified: Secondary | ICD-10-CM

## 2021-12-04 DIAGNOSIS — E1129 Type 2 diabetes mellitus with other diabetic kidney complication: Secondary | ICD-10-CM | POA: Diagnosis not present

## 2021-12-04 DIAGNOSIS — E669 Obesity, unspecified: Secondary | ICD-10-CM

## 2021-12-04 DIAGNOSIS — I340 Nonrheumatic mitral (valve) insufficiency: Secondary | ICD-10-CM

## 2021-12-04 DIAGNOSIS — I152 Hypertension secondary to endocrine disorders: Secondary | ICD-10-CM

## 2021-12-04 DIAGNOSIS — G709 Myoneural disorder, unspecified: Secondary | ICD-10-CM

## 2021-12-04 DIAGNOSIS — Z23 Encounter for immunization: Secondary | ICD-10-CM | POA: Diagnosis not present

## 2021-12-04 DIAGNOSIS — E119 Type 2 diabetes mellitus without complications: Secondary | ICD-10-CM

## 2021-12-04 DIAGNOSIS — E1159 Type 2 diabetes mellitus with other circulatory complications: Secondary | ICD-10-CM | POA: Diagnosis not present

## 2021-12-04 LAB — BAYER DCA HB A1C WAIVED: HB A1C (BAYER DCA - WAIVED): 5.7 % — ABNORMAL HIGH (ref 4.8–5.6)

## 2021-12-04 MED ORDER — VALSARTAN 40 MG PO TABS: 40.0000 mg | ORAL_TABLET | Freq: Every day | ORAL | 4 refills | Status: DC

## 2021-12-04 NOTE — Telephone Encounter (Signed)
Copied from CRM [id number]. Topic: General - Other >> Dec 04, 2021 10:19 AM Lyman Speller wrote: Reason for CRM: Pt called to let office know transportation cancelled on him and he has another driver now and maybe a few minutes late / please advise

## 2021-12-04 NOTE — Assessment & Plan Note (Signed)
BMI 36.99.  Recommended eating smaller high protein, low fat meals more frequently and exercising 30 mins a day 5 times a week with a goal of 10-15lb weight loss in the next 3 months. Patient voiced their understanding and motivation to adhere to these recommendations.

## 2021-12-04 NOTE — Assessment & Plan Note (Addendum)
Diagnosed 02/21/21 with A1c 9.9%, today is 5.6% -- continued trend down!!  Praised for this.  Urine ALB 30 in February 2023, we are stopping HCTZ and starting Valsartan for kidney protection, refer to HTN plan of care.  Continue Metformin XR dosing 500 MG BID.  Checking sugars and these are trending down.  Recommend he check sugars twice a day, first thing in morning with goal <130 and one time two hours after a meal with goal <180.  Educated on diabetic diet and recommend focus on this.  Return in 3 months.

## 2021-12-04 NOTE — Assessment & Plan Note (Signed)
Diagnosed 02/21/21 with A1c 9.9%, today is 5.6% -- continued trend down!!  Praised for this.  Will continue Metformin XR dosing 500 MG BID.  Checking sugars and these are trending down.  Recommend he check sugars twice a day, first thing in morning with goal <130 and one time two hours after a meal with goal <180.  Educated on diabetic diet and recommend focus on this.  Return in 3 months.

## 2021-12-04 NOTE — Assessment & Plan Note (Signed)
Noted on echo, continue collaboration with cardiology.   

## 2021-12-04 NOTE — Assessment & Plan Note (Signed)
Chronic, stable.  BP at goal today.  Discussed at length with patient -- will take off HCTZ and start on Valsartan 40 MG daily to add on ARB for kidney protection.  Educated him on this at length and reason for change -- educated on medication and use + side effects to monitor for.  To alert provider if elevations in BP with change.  Recommend he monitor BP at least a few mornings a week at home and document.  DASH diet at home.  Continue current medication regimen and adjust as needed. Labs today: CMP today.

## 2021-12-04 NOTE — Telephone Encounter (Signed)
Noted  

## 2021-12-04 NOTE — Progress Notes (Signed)
BP 126/85   Pulse 61   Temp 98 F (36.7 C) (Oral)   Ht 5' 9.02" (1.753 m)   Wt 250 lb 9.6 oz (113.7 kg)   SpO2 97%   BMI 36.99 kg/m    Subjective:    Patient ID: Ryan Schmidt, male    DOB: July 26, 1955, 66 y.o.   MRN: [id number]  HPI: Ryan Schmidt is a 66 y.o. male  Chief Complaint  Patient presents with   Diabetes   Hyperlipidemia   Hypertension   DIABETES Diagnosed on 02/21/21 with A1c 9.9%, last visit A1c 5.8%.  Taking Metformin XR 500 MG BID.   Hypoglycemic episodes:no Polydipsia/polyuria: no Visual disturbance: no Chest pain: no Paresthesias: no Glucose Monitoring: yes  Accucheck frequency: Daily  Fasting glucose: 130 - 140 range  Post prandial:  Evening: 120 range  Before meals: Taking Insulin?: no  Long acting insulin:  Short acting insulin: Blood Pressure Monitoring: a few times a week Retinal Examination: Up to Date scheduled with Dr. Wynonia Lawman Exam: Up to Date Pneumovax: Up to Date Influenza: Up to Date Aspirin: no   HYPERTENSION / HYPERLIPIDEMIA Continues on HCTZ and Atorvastatin daily.  Continues to follow with cardiology, last visit 06/05/21.  Last echo was 04/02/21 with moderate MR noted, but normal LV function and EF >50%. Satisfied with current treatment? yes Duration of hypertension: chronic BP monitoring frequency: in mornings BP range: 120/80 range BP medication side effects: no Duration of hyperlipidemia: chronic Cholesterol medication side effects: no Cholesterol supplements: none Medication compliance: good compliance Aspirin: no Recent stressors: no Recurrent headaches: no Visual changes: no Palpitations: no Dyspnea: no Chest pain: occasional, but none recent Lower extremity edema: no Dizzy/lightheaded: no  The 10-year ASCVD risk score (Arnett DK, et al., 2019) is: 30.4%   Values used to calculate the score:     Age: 49 years     Sex: Male     Is Non-Hispanic African American: Yes     Diabetic: Yes     Tobacco smoker: No      Systolic Blood Pressure: 126 mmHg     Is BP treated: Yes     HDL Cholesterol: 32 mg/dL     Total Cholesterol: 144 mg/dL   Relevant past medical, surgical, family and social history reviewed and updated as indicated. Interim medical history since our last visit reviewed. Allergies and medications reviewed and updated.  Review of Systems  Constitutional:  Negative for activity change, diaphoresis, fatigue and fever.  Respiratory:  Negative for cough, chest tightness, shortness of breath and wheezing.   Cardiovascular:  Negative for chest pain, palpitations and leg swelling.  Gastrointestinal: Negative.   Neurological: Negative.   Psychiatric/Behavioral: Negative.     Per HPI unless specifically indicated above     Objective:    BP 126/85   Pulse 61   Temp 98 F (36.7 C) (Oral)   Ht 5' 9.02" (1.753 m)   Wt 250 lb 9.6 oz (113.7 kg)   SpO2 97%   BMI 36.99 kg/m   Wt Readings from Last 3 Encounters:  12/04/21 250 lb 9.6 oz (113.7 kg)  05/22/21 259 lb 12.8 oz (117.8 kg)  03/22/21 258 lb 3.2 oz (117.1 kg)    Physical Exam Vitals and nursing note reviewed.  Constitutional:      General: He is awake. He is not in acute distress.    Appearance: He is well-developed and well-groomed. He is obese. He is not ill-appearing or toxic-appearing.  HENT:  Head: Normocephalic and atraumatic.     Right Ear: Hearing normal. No drainage.     Left Ear: Hearing normal. No drainage.  Eyes:     General: Lids are normal.        Right eye: No discharge.        Left eye: No discharge.     Conjunctiva/sclera: Conjunctivae normal.     Pupils: Pupils are equal, round, and reactive to light.  Neck:     Thyroid: No thyromegaly.     Vascular: No carotid bruit.  Cardiovascular:     Rate and Rhythm: Normal rate and regular rhythm.     Heart sounds: Normal heart sounds, S1 normal and S2 normal. No murmur heard.    No gallop.  Pulmonary:     Effort: Pulmonary effort is normal. No accessory  muscle usage or respiratory distress.     Breath sounds: Normal breath sounds.  Abdominal:     General: Bowel sounds are normal.     Palpations: Abdomen is soft.  Musculoskeletal:        General: Normal range of motion.     Cervical back: Normal range of motion and neck supple.     Right lower leg: No edema.     Left lower leg: No edema.  Lymphadenopathy:     Cervical: No cervical adenopathy.  Skin:    General: Skin is warm and dry.     Capillary Refill: Capillary refill takes less than 2 seconds.  Neurological:     Mental Status: He is alert and oriented to person, place, and time.     Deep Tendon Reflexes: Reflexes are normal and symmetric.  Psychiatric:        Attention and Perception: Attention normal.        Mood and Affect: Mood normal.        Speech: Speech normal.        Behavior: Behavior normal. Behavior is cooperative.        Thought Content: Thought content normal.    Results for orders placed or performed in visit on 12/04/21  Bayer DCA Hb A1c Waived  Result Value Ref Range   HB A1C (BAYER DCA - WAIVED) 5.7 (H) 4.8 - 5.6 %      Assessment & Plan:   Problem List Items Addressed This Visit       Cardiovascular and Mediastinum   Hypertension associated with diabetes (HCC)    Chronic, stable.  BP at goal today.  Discussed at length with patient -- will take off HCTZ and start on Valsartan 40 MG daily to add on ARB for kidney protection.  Educated him on this at length and reason for change -- educated on medication and use + side effects to monitor for.  To alert provider if elevations in BP with change.  Recommend he monitor BP at least a few mornings a week at home and document.  DASH diet at home.  Continue current medication regimen and adjust as needed. Labs today: CMP today.         Relevant Medications   valsartan (DIOVAN) 40 MG tablet   Other Relevant Orders   Bayer DCA Hb A1c Waived (Completed)   Comprehensive metabolic panel   Nonrheumatic mitral  valve regurgitation    Noted on echo, continue collaboration with cardiology.      Relevant Medications   valsartan (DIOVAN) 40 MG tablet     Endocrine   Hyperlipidemia associated with type 2 diabetes mellitus (HCC)  Chronic, ongoing.  Continue statin daily and adjust dosing as needed. Lipid panel today.      Relevant Medications   valsartan (DIOVAN) 40 MG tablet   Other Relevant Orders   Bayer DCA Hb A1c Waived (Completed)   Comprehensive metabolic panel   Lipid Panel w/o Chol/HDL Ratio   Type 2 diabetes mellitus with obesity (HCC)    Diagnosed 02/21/21 with A1c 9.9%, today is 5.6% -- continued trend down!!  Praised for this.  Will continue Metformin XR dosing 500 MG BID.  Checking sugars and these are trending down.  Recommend he check sugars twice a day, first thing in morning with goal <130 and one time two hours after a meal with goal <180.  Educated on diabetic diet and recommend focus on this.  Return in 3 months.      Relevant Medications   valsartan (DIOVAN) 40 MG tablet   Other Relevant Orders   Bayer DCA Hb A1c Waived (Completed)   Comprehensive metabolic panel   Type 2 diabetes mellitus with proteinuria (HCC) - Primary    Diagnosed 02/21/21 with A1c 9.9%, today is 5.6% -- continued trend down!!  Praised for this.  Urine ALB 30 in February 2023, we are stopping HCTZ and starting Valsartan for kidney protection, refer to HTN plan of care.  Continue Metformin XR dosing 500 MG BID.  Checking sugars and these are trending down.  Recommend he check sugars twice a day, first thing in morning with goal <130 and one time two hours after a meal with goal <180.  Educated on diabetic diet and recommend focus on this.  Return in 3 months.      Relevant Medications   valsartan (DIOVAN) 40 MG tablet   Other Relevant Orders   Bayer DCA Hb A1c Waived (Completed)   Comprehensive metabolic panel     Other   Morbid obesity (HCC)    BMI 36.99.  Recommended eating smaller high protein,  low fat meals more frequently and exercising 30 mins a day 5 times a week with a goal of 10-15lb weight loss in the next 3 months. Patient voiced their understanding and motivation to adhere to these recommendations.       Other Visit Diagnoses     Flu vaccine need       Flu vaccine in office today.   Relevant Orders   Flu Vaccine QUAD High Dose(Fluad) (Completed)       Follow up plan: Return in about 3 months (around 03/06/2022) for Annual physical.

## 2021-12-04 NOTE — Assessment & Plan Note (Signed)
Chronic, ongoing.  Continue statin daily and adjust dosing as needed. Lipid panel today. 

## 2021-12-05 LAB — COMPREHENSIVE METABOLIC PANEL
ALT: 34 IU/L (ref 0–44)
AST: 26 IU/L (ref 0–40)
Albumin/Globulin Ratio: 1.5 (ref 1.2–2.2)
Albumin: 4.3 g/dL (ref 3.9–4.9)
Alkaline Phosphatase: 129 IU/L — ABNORMAL HIGH (ref 44–121)
BUN/Creatinine Ratio: 10 (ref 10–24)
BUN: 9 mg/dL (ref 8–27)
Bilirubin Total: 0.4 mg/dL (ref 0.0–1.2)
CO2: 24 mmol/L (ref 20–29)
Calcium: 9.8 mg/dL (ref 8.6–10.2)
Chloride: 99 mmol/L (ref 96–106)
Creatinine, Ser: 0.9 mg/dL (ref 0.76–1.27)
Globulin, Total: 2.8 g/dL (ref 1.5–4.5)
Glucose: 135 mg/dL — ABNORMAL HIGH (ref 70–99)
Potassium: 4 mmol/L (ref 3.5–5.2)
Sodium: 138 mmol/L (ref 134–144)
Total Protein: 7.1 g/dL (ref 6.0–8.5)
eGFR: 94 mL/min/{1.73_m2} (ref >59)

## 2021-12-05 LAB — LIPID PANEL W/O CHOL/HDL RATIO
Cholesterol, Total: 119 mg/dL (ref 100–199)
HDL: 32 mg/dL — ABNORMAL LOW (ref >39)
LDL Chol Calc (NIH): 50 mg/dL (ref 0–99)
Triglycerides: 234 mg/dL — ABNORMAL HIGH (ref 0–149)
VLDL Cholesterol Cal: 37 mg/dL (ref 5–40)

## 2021-12-05 NOTE — Progress Notes (Signed)
Good afternoon, please let Ural know labs have returned and are overall nice and stable.  Triglycerides a bit elevated, continue cholesterol medication and diet focus.  Any questions? Keep being awesome!!  Thank you for allowing me to participate in your care.  I appreciate you. Kindest regards, Nellie Chevalier

## 2021-12-27 ENCOUNTER — Ambulatory Visit: Payer: Self-pay | Admitting: *Deleted

## 2021-12-27 NOTE — Telephone Encounter (Signed)
  Chief Complaint: HTN Symptoms: elevated BP Frequency: 2 weeks Pertinent Negatives: Patient denies HA, chest pain, dizziness Disposition: [] ED /[] Urgent Care (no appt availability in office) / [] Appointment(In office/virtual)/ []  [facility]/ [] Home Care/ [] Refused Recommended Disposition /[] [facility]/ [x]  Follow-up with PCP Additional Notes: pt calling back stating he was suppose to FU with provider if BP still elevated. Pt gave BP readings. Denies having any sx. Advised would send message back and have nurse FU on Tuesday when office comes back.    11 146/95 12 131/73 13 131/70  14 141/85 15 155/97 18 148/93 19 138/86 153/90 21 135/79 146/80   Reason for Disposition  [1] Systolic BP  >= 130 OR Diastolic >= 80 AND [2] taking BP medications  Protocols used: Blood Pressure - High-A-AH

## 2021-12-27 NOTE — Telephone Encounter (Signed)
Third attempt to reach pt. Routing to practice for providers review per protocol.

## 2021-12-27 NOTE — Telephone Encounter (Signed)
Summary: BP elevated   Pt stated PCP advised him to call her and let her know how his BP is doing on the new medication. P stated new BP medication is Valsartan (DIOVAN) 40 MG tablet. Pt stated BP has been elevated and or higher last time he checked 148/90 no other symptoms.  Pt seeking clinical advice.        2nd call attempted to contact patient #[phone] to review elevated BP. No answer, LVMTCB [phone].

## 2021-12-27 NOTE — Telephone Encounter (Signed)
Summary: BP elevated   Pt stated PCP advised him to call her and let her know how his BP is doing on the new medication. P stated new BP medication is Valsartan (DIOVAN) 40 MG tablet. Pt stated BP has been elevated and or higher last time he checked 148/90 no other symptoms.  Pt seeking clinical advice.    Attempted to call patient- left message to call office regarding BP concerns

## 2021-12-31 NOTE — Telephone Encounter (Signed)
Returned patient's call and informed him of PCP's recommendation, patient verbalized understanding and was scheduled for 01/07/22 at 10:40 am

## 2021-12-31 NOTE — Telephone Encounter (Signed)
Called patient to discuss message received by patient, no answer, unable to leave a voicemail for patient to return my call.    Ok for nurse triage to give results if patient calls back.

## 2022-01-05 NOTE — Patient Instructions (Signed)

## 2022-01-07 ENCOUNTER — Ambulatory Visit (INDEPENDENT_AMBULATORY_CARE_PROVIDER_SITE_OTHER): Payer: 59 | Admitting: Nurse Practitioner

## 2022-01-07 ENCOUNTER — Encounter: Payer: Self-pay | Admitting: Nurse Practitioner

## 2022-01-07 VITALS — BP 120/76 | HR 73 | Temp 97.7°F | Ht 69.02 in | Wt 259.7 lb

## 2022-01-07 DIAGNOSIS — E1159 Type 2 diabetes mellitus with other circulatory complications: Secondary | ICD-10-CM | POA: Diagnosis not present

## 2022-01-07 DIAGNOSIS — Z0000 Encounter for general adult medical examination without abnormal findings: Secondary | ICD-10-CM

## 2022-01-07 DIAGNOSIS — I152 Hypertension secondary to endocrine disorders: Secondary | ICD-10-CM

## 2022-01-07 MED ORDER — VALSARTAN 80 MG PO TABS: 80.0000 mg | ORAL_TABLET | Freq: Every day | ORAL | 4 refills | Status: DC

## 2022-01-07 NOTE — Assessment & Plan Note (Addendum)
Chronic, stable.  BP at goal today with change to Valsartan, now at 80 MG -- HCTZ stopped last visit.  Tolerating well, will continue this.  Recommend he monitor BP at least a few mornings a week at home and document.  DASH diet at home.  Continue current medication regimen and adjust as needed. Labs today: up to date.  Return in 2 months.

## 2022-01-07 NOTE — Progress Notes (Signed)
BP 120/76   Pulse 73   Temp 97.7 F (36.5 C) (Oral)   Ht 5' 9.02" (1.753 m)   Wt 259 lb 11.2 oz (117.8 kg)   SpO2 97%   BMI 38.33 kg/m    Subjective:    Patient ID: Ryan Schmidt, male    DOB: Jan 15, 1955, 67 y.o.   MRN: [id number]  HPI: Ryan Schmidt is a 67 y.o. male  Chief Complaint  Patient presents with   Diabetes   Hypertension   HYPERTENSION  Started on Valsartan on 12/04/21 due to diabetes and stopped HCTZ.  Increased to 80 MG on 11/27/21 due to elevations in BP at home on wrist cuff.   Hypertension status: stable  Satisfied with current treatment? yes Duration of hypertension: chronic BP monitoring frequency:  daily BP range: 116/62 to 135/80 BP medication side effects:  no Medication compliance: good compliance Aspirin: no Recurrent headaches: no Visual changes: no Palpitations: no Dyspnea: no Chest pain: no Lower extremity edema: no Dizzy/lightheaded: no   Relevant past medical, surgical, family and social history reviewed and updated as indicated. Interim medical history since our last visit reviewed. Allergies and medications reviewed and updated.  Review of Systems  Constitutional:  Negative for activity change, diaphoresis, fatigue and fever.  Respiratory:  Negative for cough, chest tightness, shortness of breath and wheezing.   Cardiovascular:  Negative for chest pain, palpitations and leg swelling.  Gastrointestinal: Negative.   Neurological: Negative.   Psychiatric/Behavioral: Negative.      Per HPI unless specifically indicated above     Objective:    BP 120/76   Pulse 73   Temp 97.7 F (36.5 C) (Oral)   Ht 5' 9.02" (1.753 m)   Wt 259 lb 11.2 oz (117.8 kg)   SpO2 97%   BMI 38.33 kg/m   Wt Readings from Last 3 Encounters:  01/07/22 259 lb 11.2 oz (117.8 kg)  12/04/21 250 lb 9.6 oz (113.7 kg)  05/22/21 259 lb 12.8 oz (117.8 kg)    Physical Exam Vitals and nursing note reviewed.  Constitutional:      General: He is awake. He is  not in acute distress.    Appearance: He is well-developed and well-groomed. He is obese. He is not ill-appearing or toxic-appearing.  HENT:     Head: Normocephalic and atraumatic.     Right Ear: Hearing normal. No drainage.     Left Ear: Hearing normal. No drainage.  Eyes:     General: Lids are normal.        Right eye: No discharge.        Left eye: No discharge.     Conjunctiva/sclera: Conjunctivae normal.     Pupils: Pupils are equal, round, and reactive to light.  Neck:     Thyroid: No thyromegaly.     Vascular: No carotid bruit.  Cardiovascular:     Rate and Rhythm: Normal rate and regular rhythm.     Heart sounds: Normal heart sounds, S1 normal and S2 normal. No murmur heard.    No gallop.  Pulmonary:     Effort: Pulmonary effort is normal. No accessory muscle usage or respiratory distress.     Breath sounds: Normal breath sounds.  Abdominal:     General: Bowel sounds are normal.     Palpations: Abdomen is soft.  Musculoskeletal:        General: Normal range of motion.     Cervical back: Normal range of motion and neck supple.  Right lower leg: No edema.     Left lower leg: No edema.  Lymphadenopathy:     Cervical: No cervical adenopathy.  Skin:    General: Skin is warm and dry.     Capillary Refill: Capillary refill takes less than 2 seconds.  Neurological:     Mental Status: He is alert and oriented to person, place, and time.     Deep Tendon Reflexes: Reflexes are normal and symmetric.  Psychiatric:        Attention and Perception: Attention normal.        Mood and Affect: Mood normal.        Speech: Speech normal.        Behavior: Behavior normal. Behavior is cooperative.        Thought Content: Thought content normal.    Results for orders placed or performed in visit on 12/04/21  Bayer DCA Hb A1c Waived  Result Value Ref Range   HB A1C (BAYER DCA - WAIVED) 5.7 (H) 4.8 - 5.6 %  Comprehensive metabolic panel  Result Value Ref Range   Glucose 135 (H)  70 - 99 mg/dL   BUN 9 8 - 27 mg/dL   Creatinine, Ser 0.90 0.76 - 1.27 mg/dL   eGFR 94 >59 mL/min/1.73   BUN/Creatinine Ratio 10 10 - 24   Sodium 138 134 - 144 mmol/L   Potassium 4.0 3.5 - 5.2 mmol/L   Chloride 99 96 - 106 mmol/L   CO2 24 20 - 29 mmol/L   Calcium 9.8 8.6 - 10.2 mg/dL   Total Protein 7.1 6.0 - 8.5 g/dL   Albumin 4.3 3.9 - 4.9 g/dL   Globulin, Total 2.8 1.5 - 4.5 g/dL   Albumin/Globulin Ratio 1.5 1.2 - 2.2   Bilirubin Total 0.4 0.0 - 1.2 mg/dL   Alkaline Phosphatase 129 (H) 44 - 121 IU/L   AST 26 0 - 40 IU/L   ALT 34 0 - 44 IU/L  Lipid Panel w/o Chol/HDL Ratio  Result Value Ref Range   Cholesterol, Total 119 100 - 199 mg/dL   Triglycerides 234 (H) 0 - 149 mg/dL   HDL 32 (L) >39 mg/dL   VLDL Cholesterol Cal 37 5 - 40 mg/dL   LDL Chol Calc (NIH) 50 0 - 99 mg/dL      Assessment & Plan:   Problem List Items Addressed This Visit       Cardiovascular and Mediastinum   Hypertension associated with diabetes ([place]) - Primary    Chronic, stable.  BP at goal today with change to Valsartan, now at 80 MG -- HCTZ stopped last visit.  Tolerating well, will continue this.  Recommend he monitor BP at least a few mornings a week at home and document.  DASH diet at home.  Continue current medication regimen and adjust as needed. Labs today: up to date.  Return in 2 months.       Relevant Medications   valsartan (DIOVAN) 80 MG tablet     Follow up plan: Return in about 3 months (around 04/08/2022) for T2DM, HTN/HLD, BPH + MEDICARE WELLNESS SUBSEQUENT.

## 2022-02-26 ENCOUNTER — Other Ambulatory Visit: Payer: Self-pay | Admitting: Nurse Practitioner

## 2022-02-26 NOTE — Telephone Encounter (Signed)
Unable to refill per protocol, Rx expired. Discontinued 01/07/22. Will refuse.  Requested Prescriptions  Pending Prescriptions Disp Refills   hydrochlorothiazide (HYDRODIURIL) 25 MG tablet [Pharmacy Med Name: hydroCHLOROthiazide 25 MG Oral Tablet] 100 tablet 2    Sig: TAKE 1 TABLET BY MOUTH DAILY     Cardiovascular: Diuretics - Thiazide Passed - 02/26/2022  2:44 AM      Passed - Cr in normal range and within 180 days    Creatinine, Ser  Date Value Ref Range Status  12/04/2021 0.90 0.76 - 1.27 mg/dL Final         Passed - K in normal range and within 180 days    Potassium  Date Value Ref Range Status  12/04/2021 4.0 3.5 - 5.2 mmol/L Final         Passed - Na in normal range and within 180 days    Sodium  Date Value Ref Range Status  12/04/2021 138 134 - 144 mmol/L Final         Passed - Last BP in normal range    BP Readings from Last 1 Encounters:  01/07/22 120/76         Passed - Valid encounter within last 6 months    Recent Outpatient Visits           1 month ago Hypertension associated with diabetes ([place])   [place]ull[place], [place] T, NP   2 months ago Type 2 diabetes mellitus with proteinuria ([place])   [place], Miranda T, NP   9 months ago Type 2 diabetes mellitus with obesity ([place])   [place], [place] T, NP   11 months ago Type 2 diabetes mellitus with obesity ([place])   [place], Henrine Screws T, NP   1 year ago Neuromuscular disorder [facility])   [place], Barbaraann Faster, NP       Future Appointments             In 2 weeks Cannady, Barbaraann Faster, NP [place], [place]   In 1 month [place], Barbaraann Faster, NP [place], [facility]

## 2022-03-18 ENCOUNTER — Other Ambulatory Visit: Payer: Self-pay | Admitting: Nurse Practitioner

## 2022-03-18 ENCOUNTER — Encounter: Payer: 59 | Admitting: Nurse Practitioner

## 2022-03-18 DIAGNOSIS — N401 Enlarged prostate with lower urinary tract symptoms: Secondary | ICD-10-CM

## 2022-03-18 DIAGNOSIS — E1159 Type 2 diabetes mellitus with other circulatory complications: Secondary | ICD-10-CM

## 2022-03-18 DIAGNOSIS — G709 Myoneural disorder, unspecified: Secondary | ICD-10-CM

## 2022-03-18 DIAGNOSIS — Z0000 Encounter for general adult medical examination without abnormal findings: Secondary | ICD-10-CM

## 2022-03-18 DIAGNOSIS — Z23 Encounter for immunization: Secondary | ICD-10-CM

## 2022-03-18 DIAGNOSIS — E559 Vitamin D deficiency, unspecified: Secondary | ICD-10-CM

## 2022-03-18 DIAGNOSIS — E1169 Type 2 diabetes mellitus with other specified complication: Secondary | ICD-10-CM

## 2022-03-18 DIAGNOSIS — E1129 Type 2 diabetes mellitus with other diabetic kidney complication: Secondary | ICD-10-CM

## 2022-03-18 DIAGNOSIS — I340 Nonrheumatic mitral (valve) insufficiency: Secondary | ICD-10-CM

## 2022-03-19 ENCOUNTER — Other Ambulatory Visit: Payer: Self-pay | Admitting: Nurse Practitioner

## 2022-03-19 NOTE — Telephone Encounter (Signed)
Expired Rx- but was written for 15 month supply-02/21/21 #90 4RF- will extend RF Requested Prescriptions  Pending Prescriptions Disp Refills   atorvastatin (LIPITOR) 40 MG tablet [Pharmacy Med Name: Atorvastatin Calcium 40 MG Oral Tablet] 100 tablet 2    Sig: TAKE 1 TABLET BY MOUTH DAILY     Cardiovascular:  Antilipid - Statins Failed - 03/19/2022  2:25 AM      Failed - Lipid Panel in normal range within the last 12 months    Cholesterol, Total  Date Value Ref Range Status  12/04/2021 119 100 - 199 mg/dL Final   Cholesterol Piccolo, Waived  Date Value Ref Range Status  06/04/2018 118 <200 mg/dL Final    Comment:                            Desirable                <200                         Borderline High      200- 239                         High                     >239    LDL Chol Calc (NIH)  Date Value Ref Range Status  12/04/2021 50 0 - 99 mg/dL Final   HDL  Date Value Ref Range Status  12/04/2021 32 (L) >39 mg/dL Final   Triglycerides  Date Value Ref Range Status  12/04/2021 234 (H) 0 - 149 mg/dL Final   Triglycerides Piccolo,Waived  Date Value Ref Range Status  06/04/2018 126 <150 mg/dL Final    Comment:                            Normal                   <150                         Borderline High     150 - 199                         High                200 - 499                         Very High                >499          Passed - Patient is not pregnant      Passed - Valid encounter within last 12 months    Recent Outpatient Visits           2 months ago Hypertension associated with diabetes ([name])   [place], Jolene T, NP   3 months ago Type 2 diabetes mellitus with proteinuria ([name])   [place], [place] T, NP   10 months ago Type 2 diabetes mellitus with obesity ([name])   [place], [place] T, NP   12 months ago Type  2 diabetes mellitus with  obesity ([name])   [place], Henrine Screws T, NP   1 year ago Neuromuscular disorder [facility])   [place] Venita Lick, NP       Future Appointments             In 2 weeks Cannady, Barbaraann Faster, NP [place], [facility]

## 2022-03-19 NOTE — Telephone Encounter (Signed)
Unable to refill per protocol, Rx request is too soon. Last refill 02/21/21 for 90 and 4 refills. Next refill due May.  Requested Prescriptions  Pending Prescriptions Disp Refills   tamsulosin (FLOMAX) 0.4 MG CAPS capsule [Pharmacy Med Name: Tamsulosin HCl 0.4 MG Oral Capsule] 100 capsule 2    Sig: TAKE 1 CAPSULE BY MOUTH DAILY  AFTER SUPPER     Urology: Alpha-Adrenergic Blocker Failed - 03/18/2022 10:49 PM      Failed - PSA in normal range and within 360 days    Prostate Specific Ag, Serum  Date Value Ref Range Status  02/21/2021 0.8 0.0 - 4.0 ng/mL Final    Comment:    Roche ECLIA methodology. According to the American Urological Association, Serum PSA should decrease and remain at undetectable levels after radical prostatectomy. The AUA defines biochemical recurrence as an initial PSA value 0.2 ng/mL or greater followed by a subsequent confirmatory PSA value 0.2 ng/mL or greater. Values obtained with different assay methods or kits cannot be used interchangeably. Results cannot be interpreted as absolute evidence of the presence or absence of malignant disease.          Passed - Last BP in normal range    BP Readings from Last 1 Encounters:  01/07/22 120/76         Passed - Valid encounter within last 12 months    Recent Outpatient Visits           2 months ago Hypertension associated with diabetes ([place])   [place], [place]man T, NP   3 months ago Type 2 diabetes mellitus with proteinuria ([place])   [place], [place] T, NP   10 months ago Type 2 diabetes mellitus with obesity ([place])   [place], [place] T, NP   12 months ago Type 2 diabetes mellitus with obesity ([place])   [place], Henrine Screws T, NP   1 year ago Neuromuscular disorder [facility])   [place], Barbaraann Faster, NP       Future Appointments             In  2 weeks Cannady, Barbaraann Faster, NP [place], [facility]

## 2022-03-29 NOTE — Patient Instructions (Signed)
Be Involved in Your Health Care:  Taking Medications When medications are taken as directed, they can greatly improve your health. But if they are not taken as instructed, they may not work. In some cases, not taking them correctly can be harmful. To help ensure your treatment remains effective and safe, understand your medications and how to take them.  Your lab results, notes and after visit summary will be available on My Chart. We strongly encourage you to use this feature. If lab results are abnormal the clinic will contact you with the appropriate steps. If the clinic does not contact you assume the results are satisfactory. You can always see your results on My Chart. If you have questions regarding your condition, please contact the clinic during office hours. You can also ask questions on My Chart.  We at [facility] are grateful that you chose us to provide care. We strive to provide excellent and compassionate care and are always looking for feedback. If you get a survey from the clinic please complete this.   Diabetes Mellitus Basics  Diabetes mellitus, or diabetes, is a long-term (chronic) disease. It occurs when the body does not properly use sugar (glucose) that is released from food after you eat. Diabetes mellitus may be caused by one or both of these problems: Your pancreas does not make enough of a hormone called insulin. Your body does not react in a normal way to the insulin that it makes. Insulin lets glucose enter cells in your body. This gives you energy. If you have diabetes, glucose cannot get into cells. This causes high blood glucose (hyperglycemia). How to treat and manage diabetes You may need to take insulin or other diabetes medicines daily to keep your glucose in balance. If you are prescribed insulin, you will learn how to give yourself insulin by injection. You may need to adjust the amount of insulin you take based on the foods that you eat. You will  need to check your blood glucose levels using a glucose monitor as told by your health care provider. The readings can help determine if you have low or high blood glucose. Generally, you should have these blood glucose levels: Before meals (preprandial): 80-130 mg/dL (4.4-7.2 mmol/L). After meals (postprandial): below 180 mg/dL (10 mmol/L). Hemoglobin A1c (HbA1c) level: less than 7%. Your health care provider will set treatment goals for you. Keep all follow-up visits. This is important. Follow these instructions at home: Diabetes medicines Take your diabetes medicines every day as told by your health care provider. List your diabetes medicines here: Name of medicine: ______________________________ Amount (dose): _______________ Time (a.m./p.m.): _______________ Notes: ___________________________________ Name of medicine: ______________________________ Amount (dose): _______________ Time (a.m./p.m.): _______________ Notes: ___________________________________ Name of medicine: ______________________________ Amount (dose): _______________ Time (a.m./p.m.): _______________ Notes: ___________________________________ Insulin If you use insulin, list the types of insulin you use here: Insulin type: ______________________________ Amount (dose): _______________ Time (a.m./p.m.): _______________Notes: ___________________________________ Insulin type: ______________________________ Amount (dose): _______________ Time (a.m./p.m.): _______________ Notes: ___________________________________ Insulin type: ______________________________ Amount (dose): _______________ Time (a.m./p.m.): _______________ Notes: ___________________________________ Insulin type: ______________________________ Amount (dose): _______________ Time (a.m./p.m.): _______________ Notes: ___________________________________ Insulin type: ______________________________ Amount (dose): _______________ Time (a.m./p.m.): _______________  Notes: ___________________________________ Managing blood glucose  Check your blood glucose levels using a glucose monitor as told by your health care provider. Write down the times that you check your glucose levels here: Time: _______________ Notes: ___________________________________ Time: _______________ Notes: ___________________________________ Time: _______________ Notes: ___________________________________ Time: _______________ Notes: ___________________________________ Time: _______________ Notes: ___________________________________ Time: _______________ Notes: ___________________________________    Low blood glucose Low blood glucose (hypoglycemia) is when glucose is at or below 70 mg/dL (3.9 mmol/L). Symptoms may include: Feeling: Hungry. Sweaty and clammy. Irritable or easily upset. Dizzy. Sleepy. Having: A fast heartbeat. A headache. A change in your vision. Numbness around the mouth, lips, or tongue. Having trouble with: Moving (coordination). Sleeping. Treating low blood glucose To treat low blood glucose, eat or drink something containing sugar right away. If you can think clearly and swallow safely, follow the 15:15 rule: Take 15 grams of a fast-acting carb (carbohydrate), as told by your health care provider. Some fast-acting carbs are: Glucose tablets: take 3-4 tablets. Hard candy: eat 3-5 pieces. Fruit juice: drink 4 oz (120 mL). Regular (not diet) soda: drink 4-6 oz (120-180 mL). Honey or sugar: eat 1 Tbsp (15 mL). Check your blood glucose levels 15 minutes after you take the carb. If your glucose is still at or below 70 mg/dL (3.9 mmol/L), take 15 grams of a carb again. If your glucose does not go above 70 mg/dL (3.9 mmol/L) after 3 tries, get help right away. After your glucose goes back to normal, eat a meal or a snack within 1 hour. Treating very low blood glucose If your glucose is at or below 54 mg/dL (3 mmol/L), you have very low blood glucose  (severe hypoglycemia). This is an emergency. Do not wait to see if the symptoms will go away. Get medical help right away. Call your local emergency services (911 in the [place] Do not drive yourself to the hospital. Questions to ask your health care provider Should I talk with a diabetes educator? What equipment will I need to care for myself at home? What diabetes medicines do I need? When should I take them? How often do I need to check my blood glucose levels? What number can I call if I have questions? When is my follow-up visit? Where can I find a support group for people with diabetes? Where to find more information American Diabetes Association: www.diabetes.org Association of Diabetes Care and Education Specialists: www.diabeteseducator.org Contact a health care provider if: Your blood glucose is at or above 240 mg/dL (13.3 mmol/L) for 2 days in a row. You have been sick or have had a fever for 2 days or more, and you are not getting better. You have any of these problems for more than 6 hours: You cannot eat or drink. You feel nauseous. You vomit. You have diarrhea. Get help right away if: Your blood glucose is lower than 54 mg/dL (3 mmol/L). You get confused. You have trouble thinking clearly. You have trouble breathing. These symptoms may represent a serious problem that is an emergency. Do not wait to see if the symptoms will go away. Get medical help right away. Call your local emergency services (911 in the [place] Do not drive yourself to the hospital. Summary Diabetes mellitus is a chronic disease that occurs when the body does not properly use sugar (glucose) that is released from food after you eat. Take insulin and diabetes medicines as told. Check your blood glucose every day, as often as told. Keep all follow-up visits. This is important. This information is not intended to replace advice given to you by your health care provider. Make sure you discuss any  questions you have with your health care provider. Document Revised: 04/26/2019 Document Reviewed: 04/26/2019 [employer] Patient Education  2023 [employer]  

## 2022-04-02 ENCOUNTER — Encounter: Payer: Self-pay | Admitting: Nurse Practitioner

## 2022-04-02 ENCOUNTER — Ambulatory Visit (INDEPENDENT_AMBULATORY_CARE_PROVIDER_SITE_OTHER): Payer: 59 | Admitting: Nurse Practitioner

## 2022-04-02 VITALS — BP 136/76 | HR 66 | Temp 97.6°F | Ht 69.02 in | Wt 261.7 lb

## 2022-04-02 DIAGNOSIS — Z0000 Encounter for general adult medical examination without abnormal findings: Secondary | ICD-10-CM

## 2022-04-02 DIAGNOSIS — E1169 Type 2 diabetes mellitus with other specified complication: Secondary | ICD-10-CM

## 2022-04-02 DIAGNOSIS — I152 Hypertension secondary to endocrine disorders: Secondary | ICD-10-CM

## 2022-04-02 DIAGNOSIS — E785 Hyperlipidemia, unspecified: Secondary | ICD-10-CM

## 2022-04-02 DIAGNOSIS — R809 Proteinuria, unspecified: Secondary | ICD-10-CM

## 2022-04-02 DIAGNOSIS — E1129 Type 2 diabetes mellitus with other diabetic kidney complication: Secondary | ICD-10-CM

## 2022-04-02 DIAGNOSIS — G709 Myoneural disorder, unspecified: Secondary | ICD-10-CM

## 2022-04-02 DIAGNOSIS — E1159 Type 2 diabetes mellitus with other circulatory complications: Secondary | ICD-10-CM

## 2022-04-02 DIAGNOSIS — Z7984 Long term (current) use of oral hypoglycemic drugs: Secondary | ICD-10-CM

## 2022-04-02 DIAGNOSIS — R351 Nocturia: Secondary | ICD-10-CM

## 2022-04-02 DIAGNOSIS — N401 Enlarged prostate with lower urinary tract symptoms: Secondary | ICD-10-CM

## 2022-04-02 DIAGNOSIS — Z6838 Body mass index (BMI) 38.0-38.9, adult: Secondary | ICD-10-CM

## 2022-04-02 DIAGNOSIS — Z23 Encounter for immunization: Secondary | ICD-10-CM

## 2022-04-02 DIAGNOSIS — E559 Vitamin D deficiency, unspecified: Secondary | ICD-10-CM

## 2022-04-02 DIAGNOSIS — I340 Nonrheumatic mitral (valve) insufficiency: Secondary | ICD-10-CM

## 2022-04-02 LAB — MICROALBUMIN, URINE WAIVED
Creatinine, Urine Waived: 50 mg/dL (ref 10–300)
Microalb, Ur Waived: 10 mg/L (ref 0–19)

## 2022-04-02 LAB — BAYER DCA HB A1C WAIVED: HB A1C (BAYER DCA - WAIVED): 7 % — ABNORMAL HIGH (ref 4.8–5.6)

## 2022-04-02 MED ORDER — VALSARTAN 80 MG PO TABS: 80.0000 mg | ORAL_TABLET | Freq: Every day | ORAL | 4 refills | Status: DC

## 2022-04-02 MED ORDER — TAMSULOSIN HCL 0.4 MG PO CAPS: ORAL_CAPSULE | ORAL | 4 refills | Status: DC

## 2022-04-02 MED ORDER — METFORMIN HCL ER 500 MG PO TB24: 500.0000 mg | ORAL_TABLET | Freq: Two times a day (BID) | ORAL | 4 refills | Status: DC

## 2022-04-02 MED ORDER — BACLOFEN 10 MG PO TABS: 10.0000 mg | ORAL_TABLET | Freq: Three times a day (TID) | ORAL | 4 refills | Status: DC

## 2022-04-02 MED ORDER — ATORVASTATIN CALCIUM 40 MG PO TABS: 40.0000 mg | ORAL_TABLET | Freq: Every day | ORAL | 4 refills | Status: DC

## 2022-04-02 NOTE — Assessment & Plan Note (Signed)
Noted on echo, continue collaboration with cardiology as needed.

## 2022-04-02 NOTE — Assessment & Plan Note (Signed)
Chronic, ongoing.  He is taking supplement, continue this and check level today.

## 2022-04-02 NOTE — Progress Notes (Signed)
BP 136/76   Pulse 66   Temp 97.6 F (36.4 C) (Oral)   Ht 5' 9.02" (1.753 m)   Wt 261 lb 11.2 oz (118.7 kg)   SpO2 96%   BMI 38.63 kg/m    Subjective:    Patient ID: Ryan Schmidt, male    DOB: 22-Mar-1955, 67 y.o.   MRN: [id number]  HPI: Ryan Schmidt is a 67 y.o. male presenting on 04/02/2022 for comprehensive medical examination and Medicare Wellness visit. Current medical complaints include:none  He currently lives with: wife Interim Problems from his last visit: no  Has neuromuscular disorder that is treated with Baclofen TID, genetic issues.   Continues on Flomax for BPH with benefit and Vitamin D supplement daily for history of lows.  DIABETES Diagnosed on 02/21/21 with A1c 9.9%, last visit A1c 5.7%.  Taking Metformin XR 500 MG BID.  He stopped eating chips, no more white rice -- eating more whole grains.  Is making own sweet tea with sugar in it, one gallon. Hypoglycemic episodes:no Polydipsia/polyuria: no Visual disturbance: no Chest pain: no Paresthesias: no Glucose Monitoring: yes             Accucheck frequency: Daily             Fasting glucose: 180             Post prandial:             Evening: 160 to 170             Before meals: Taking Insulin?: no             Long acting insulin:             Short acting insulin: Blood Pressure Monitoring: a few times a week Retinal Examination: Up to Date = Dr. Cecile Sheerer Exam: Up to Date Pneumovax: Up to Date Influenza: Up to Date Aspirin: no    HYPERTENSION / HYPERLIPIDEMIA Continues on HCTZ and Atorvastatin daily.  Follows with cardiology, last visit 06/05/21.  Echo 04/02/21 with moderate MR noted, but normal LV function and EF >50%. Satisfied with current treatment? yes Duration of hypertension: chronic BP monitoring frequency: in mornings BP range: 120-130/80 range BP medication side effects: no Duration of hyperlipidemia: chronic Cholesterol medication side effects: no Cholesterol supplements:  none Medication compliance: good compliance Aspirin: no Recent stressors: no Recurrent headaches: no Visual changes: no Palpitations: no Dyspnea: no Chest pain: occasional, but none recent Lower extremity edema: no Dizzy/lightheaded: no  The ASCVD Risk score (Arnett DK, et al., 2019) failed to calculate for the following reasons:   The valid total cholesterol range is 130 to 320 mg/dL  Functional Status Survey: Is the patient deaf or have difficulty hearing?: No Does the patient have difficulty seeing, even when wearing glasses/contacts?: No Does the patient have difficulty concentrating, remembering, or making decisions?: No Does the patient have difficulty walking or climbing stairs?: No Does the patient have difficulty dressing or bathing?: No Does the patient have difficulty doing errands alone such as visiting a doctor's office or shopping?: No     05/22/2021    8:45 AM 12/04/2021   11:30 AM 01/07/2022   10:37 AM 04/02/2022    1:23 PM 04/02/2022    1:31 PM  [place] in the past year? 0 0 0 0 0  Was there an injury with Fall? 0 0 0 0 0  Fall Risk Category Calculator 0 0 0 0 0  Fall Risk Category (  Retired) [place]    (RETIRED) Patient Fall Risk Level Low fall risk      Patient at Risk for Falls Due to No Fall Risks No Fall Risks No Fall Risks No Fall Risks No Fall Risks  Fall risk Follow up Falls evaluation completed Falls evaluation completed Falls evaluation completed Falls evaluation completed Falls prevention discussed        04/02/2022    1:23 PM 01/07/2022   10:37 AM 12/04/2021   11:30 AM 05/22/2021    8:45 AM 02/21/2021    9:05 AM  Depression screen PHQ 2/9  Decreased Interest 0 0 0 1 1  Down, Depressed, Hopeless 0 0 0 1 0  PHQ - 2 Score 0 0 0 2 1  Altered sleeping 2 2 2 1  0  Tired, decreased energy 0 1 1 0 1  Change in appetite 0 0 0 0 1  Feeling bad or failure about yourself  0 0 0 0 0  Trouble concentrating 0 0 1 0 0  Moving slowly or  fidgety/restless 0 0 0 0 0  Suicidal thoughts 0 0 0 0 0  PHQ-9 Score 2 3 4 3 3   Difficult doing work/chores Not difficult at all Not difficult at all Not difficult at all      Advanced Directives Does patient have a HCPOA?    no If yes, name and contact information: none Does patient have a living will or MOST form?  no  Past Medical History:  Past Medical History:  Diagnosis Date   Benign prostatic hyperplasia    Diabetes mellitus without complication ([place])    Hypercholesteremia    Hypertension    Mitral valve insufficiency    Neuromuscular disorder ([place])     Surgical History:  Past Surgical History:  Procedure Laterality Date   APPENDECTOMY     GLAUCOMA SURGERY Bilateral    LEG SURGERY Left    at age 61   TOTAL KNEE ARTHROPLASTY Left 06/07/2020   Procedure: TOTAL KNEE ARTHROPLASTY - Rachelle Hora to Assist;  Surgeon: Hessie Knows, MD;  Location: [facility] ORS;  Service: Orthopedics;  Laterality: Left;    Medications:  Current Outpatient Medications on File Prior to Visit  Medication Sig   acetaminophen (TYLENOL) 500 MG tablet Take 1,000 mg by mouth every 6 (six) hours as needed for mild pain or moderate pain.   Blood Glucose Monitoring Suppl (ONETOUCH VERIO) w/Device KIT Use to check blood sugar 2 times a day and document results, bring to appointments.  Goal is <130 fasting blood sugar and <180 two hours after meals.   Cholecalciferol 1.25 MG (50000 UT) TABS Take 1 tablet by mouth once a week.   glucose blood (ONETOUCH VERIO) test strip Use to check blood sugar 3 times a day and document results, bring to appointments.  Goal is <130 fasting blood sugar and <180 two hours after meals.   Lancets (ONETOUCH ULTRASOFT) lancets Use to check blood sugar 2 times a day and document results, bring to appointments.  Goal is <130 fasting blood sugar and <180 two hours after meals.   No current facility-administered medications on file prior to visit.    Allergies:  Allergies  Allergen  Reactions   Ampicillin Other (See Comments)    Hyperventilation   Morphine And Related Itching   Penicillins Other (See Comments)    Jittery Reaction: 15 years ago   Valium [Diazepam] Other (See Comments)    Nervous, jittery    Social History:  Social  History   Socioeconomic History   Marital status: Married    Spouse name: Not on file   Number of children: Not on file   Years of education: Not on file   Highest education level: Not on file  Occupational History   Occupation: disability  Tobacco Use   Smoking status: Former   Smokeless tobacco: Never   Tobacco comments:    quit 30 years ago   Vaping Use   Vaping Use: Never used  Substance and Sexual Activity   Alcohol use: Not Currently   Drug use: Not Currently   Sexual activity: Yes  Other Topics Concern   Not on file  Social History Narrative   Not on file   Social Determinants of Health   Financial Resource Strain: Low Risk  (04/02/2022)   Overall Financial Resource Strain (CARDIA)    Difficulty of Paying Living Expenses: Not hard at all  Food Insecurity: No Food Insecurity (04/02/2022)   Hunger Vital Sign    Worried About Running Out of Food in the Last Year: Never true    [place] in the Last Year: Never true  Transportation Needs: No Transportation Needs (04/02/2022)   PRAPARE - Hydrologist (Medical): No    Lack of Transportation (Non-Medical): No  Physical Activity: Insufficiently Active (04/02/2022)   Exercise Vital Sign    Days of Exercise per Week: 3 days    Minutes of Exercise per Session: 30 min  Stress: No Stress Concern Present (04/02/2022)   [place]    Feeling of Stress : Only a little  Social Connections: Socially Integrated (04/02/2022)   Social Connection and Isolation Panel [NHANES]    Frequency of Communication with Friends and Family: Patient unable to answer    Frequency of Social  Gatherings with Friends and Family: Patient unable to answer    Attends Religious Services: More than 4 times per year    Active Member of [employer] or Organizations: Yes    Attends Archivist Meetings: Never    Marital Status: Married  Human resources officer Violence: Not At Risk (04/02/2022)   Humiliation, Afraid, Rape, and Kick questionnaire    Fear of Current or Ex-Partner: No    Emotionally Abused: No    Physically Abused: No    Sexually Abused: No   Social History   Tobacco Use  Smoking Status Former  Smokeless Tobacco Never  Tobacco Comments   quit 30 years ago    Social History   Substance and Sexual Activity  Alcohol Use Not Currently    Family History:  Family History  Problem Relation Age of Onset   Cancer Mother    Dementia Sister     Past medical history, surgical history, medications, allergies, family history and social history reviewed with patient today and changes made to appropriate areas of the chart.   ROS All other ROS negative except what is listed above and in the HPI.      Objective:    BP 136/76   Pulse 66   Temp 97.6 F (36.4 C) (Oral)   Ht 5' 9.02" (1.753 m)   Wt 261 lb 11.2 oz (118.7 kg)   SpO2 96%   BMI 38.63 kg/m   Wt Readings from Last 3 Encounters:  04/02/22 261 lb 11.2 oz (118.7 kg)  01/07/22 259 lb 11.2 oz (117.8 kg)  12/04/21 250 lb 9.6 oz (113.7 kg)  Physical Exam Vitals and nursing note reviewed.  Constitutional:      General: He is awake. He is not in acute distress.    Appearance: He is well-developed and well-groomed. He is obese. He is not ill-appearing or toxic-appearing.  HENT:     Head: Normocephalic and atraumatic.     Right Ear: Hearing, tympanic membrane, ear canal and external ear normal. No drainage.     Left Ear: Hearing, tympanic membrane, ear canal and external ear normal. No drainage.     Nose: Nose normal.     Mouth/Throat:     Pharynx: Uvula midline.  Eyes:     General: Lids are normal.         Right eye: No discharge.        Left eye: No discharge.     Extraocular Movements: Extraocular movements intact.     Conjunctiva/sclera: Conjunctivae normal.     Pupils: Pupils are equal, round, and reactive to light.     Visual Fields: Right eye visual fields normal and left eye visual fields normal.  Neck:     Thyroid: No thyromegaly.     Vascular: No carotid bruit or JVD.     Trachea: Trachea normal.  Cardiovascular:     Rate and Rhythm: Normal rate and regular rhythm.     Heart sounds: Normal heart sounds, S1 normal and S2 normal. No murmur heard.    No gallop.  Pulmonary:     Effort: Pulmonary effort is normal. No accessory muscle usage or respiratory distress.     Breath sounds: Normal breath sounds.  Abdominal:     General: Bowel sounds are normal.     Palpations: Abdomen is soft. There is no hepatomegaly or splenomegaly.     Tenderness: There is no abdominal tenderness.  Musculoskeletal:        General: Normal range of motion.     Cervical back: Normal range of motion and neck supple.     Right lower leg: No edema.     Left lower leg: No edema.  Lymphadenopathy:     Head:     Right side of head: No submental, submandibular, tonsillar, preauricular or posterior auricular adenopathy.     Left side of head: No submental, submandibular, tonsillar, preauricular or posterior auricular adenopathy.     Cervical: No cervical adenopathy.  Skin:    General: Skin is warm and dry.     Capillary Refill: Capillary refill takes less than 2 seconds.     Findings: No rash.  Neurological:     Mental Status: He is alert and oriented to person, place, and time.     Gait: Gait is intact.     Deep Tendon Reflexes: Reflexes are normal and symmetric.     Reflex Scores:      Brachioradialis reflexes are 2+ on the right side and 2+ on the left side.      Patellar reflexes are 2+ on the right side and 2+ on the left side. Psychiatric:        Attention and Perception: Attention normal.         Mood and Affect: Mood normal.        Speech: Speech normal.        Behavior: Behavior normal. Behavior is cooperative.        Thought Content: Thought content normal.        Cognition and Memory: Cognition normal.       04/02/2022    1:33 PM  02/21/2021    9:31 AM 10/26/2020    1:53 PM 10/21/2019    2:42 PM  6CIT Screen  What Year? 0 points 0 points 0 points 0 points  What month? 0 points 0 points 0 points 0 points  What time? 0 points 0 points 0 points 0 points  Count back from 20 0 points 0 points 0 points 0 points  Months in reverse 0 points 0 points 4 points 4 points  Repeat phrase 0 points 2 points 0 points 2 points  Total Score 0 points 2 points 4 points 6 points   Results for orders placed or performed in visit on 12/04/21  Bayer DCA Hb A1c Waived  Result Value Ref Range   HB A1C (BAYER DCA - WAIVED) 5.7 (H) 4.8 - 5.6 %  Comprehensive metabolic panel  Result Value Ref Range   Glucose 135 (H) 70 - 99 mg/dL   BUN 9 8 - 27 mg/dL   Creatinine, Ser 0.90 0.76 - 1.27 mg/dL   eGFR 94 >59 mL/min/1.73   BUN/Creatinine Ratio 10 10 - 24   Sodium 138 134 - 144 mmol/L   Potassium 4.0 3.5 - 5.2 mmol/L   Chloride 99 96 - 106 mmol/L   CO2 24 20 - 29 mmol/L   Calcium 9.8 8.6 - 10.2 mg/dL   Total Protein 7.1 6.0 - 8.5 g/dL   Albumin 4.3 3.9 - 4.9 g/dL   Globulin, Total 2.8 1.5 - 4.5 g/dL   Albumin/Globulin Ratio 1.5 1.2 - 2.2   Bilirubin Total 0.4 0.0 - 1.2 mg/dL   Alkaline Phosphatase 129 (H) 44 - 121 IU/L   AST 26 0 - 40 IU/L   ALT 34 0 - 44 IU/L  Lipid Panel w/o Chol/HDL Ratio  Result Value Ref Range   Cholesterol, Total 119 100 - 199 mg/dL   Triglycerides 234 (H) 0 - 149 mg/dL   HDL 32 (L) >39 mg/dL   VLDL Cholesterol Cal 37 5 - 40 mg/dL   LDL Chol Calc (NIH) 50 0 - 99 mg/dL      Assessment & Plan:   Problem List Items Addressed This Visit       Cardiovascular and Mediastinum   Hypertension associated with diabetes (HCC)    Chronic, stable.  BP at goal today in  office.  Valsartan for kidney protection, urine ALB 16 March 2022.  Tolerating well, will continue this.  Recommend he monitor BP at least a few mornings a week at home and document.  DASH diet at home.  Continue current medication regimen and adjust as needed. Labs today: CBC, CMP, TSH, urine ALB.  Return in 3 months.       Relevant Medications   atorvastatin (LIPITOR) 40 MG tablet   metFORMIN (GLUCOPHAGE-XR) 500 MG 24 hr tablet   valsartan (DIOVAN) 80 MG tablet   Other Relevant Orders   Bayer DCA Hb A1c Waived   Microalbumin, Urine Waived   CBC with Differential/Platelet   Comprehensive metabolic panel   TSH   Nonrheumatic mitral valve regurgitation    Noted on echo, continue collaboration with cardiology as needed.      Relevant Medications   atorvastatin (LIPITOR) 40 MG tablet   valsartan (DIOVAN) 80 MG tablet     Endocrine   Hyperlipidemia associated with type 2 diabetes mellitus (HCC)    Chronic, ongoing.  Continue statin daily and adjust dosing as needed. Lipid panel today.      Relevant Medications   atorvastatin (LIPITOR) 40  MG tablet   metFORMIN (GLUCOPHAGE-XR) 500 MG 24 hr tablet   valsartan (DIOVAN) 80 MG tablet   Other Relevant Orders   Bayer DCA Hb A1c Waived   Comprehensive metabolic panel   Lipid Panel w/o Chol/HDL Ratio   Type 2 diabetes mellitus with obesity ([place])    Diagnosed 02/21/21 with A1c 9.9%, today is 7% -- trending up due to diet.  Urine ALB 10 in March 2024, continue Valsartan for kidney protection.  Continue Metformin XR dosing 500 MG BID and recommend he place heavy focus on diet changes over next months to get A1c <7%.  Recommend he check sugars twice a day, first thing in morning with goal <130 and one time two hours after a meal with goal <180.  Educated on diabetic diet and recommend focus on this.  Return in 3 months.      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  Urine ALB 10 in March 2024, continue Valsartan for kidney protection.  Continue Metformin XR dosing 500 MG BID and recommend he place heavy focus on diet changes over next months to get A1c <7%.  Recommend he check sugars twice a day, first thing in morning with goal <130 and one time two hours after a meal with goal <180.  Educated on diabetic diet and recommend focus on this.  Return in 3 months.      Relevant Medications   atorvastatin (LIPITOR) 40 MG tablet   metFORMIN (GLUCOPHAGE-XR) 500 MG 24 hr tablet   valsartan (DIOVAN) 80 MG tablet   Other Relevant Orders   Bayer DCA Hb A1c Waived   Microalbumin, Urine Waived     Nervous and Auditory   Neuromuscular disorder (HCC)    Chronic, ongoing.  Continue Baclofen, recommend he take this as ordered to prevent issues while walking.  He declines referral to neurology or PT at this time.  Return in 6 months.      Relevant Medications   baclofen (LIORESAL) 10 MG tablet     Genitourinary   BPH (benign prostatic hyperplasia)    Chronic, stable with Flomax.  Continue current regimen and adjust as needed.  PSA today on labs.      Relevant Medications   tamsulosin (FLOMAX) 0.4 MG CAPS capsule   Other Relevant Orders   PSA     Other   Morbid obesity (HCC)    BMI 38.63 with T2DM, HTN, HLD.  Recommended eating smaller high protein, low fat meals more frequently and exercising 30 mins a day 5 times a week with a goal of 10-15lb weight loss in the next 3 months. Patient voiced their understanding and motivation to adhere to these recommendations.       Relevant Medications   metFORMIN (GLUCOPHAGE-XR) 500 MG 24 hr tablet   Vitamin D deficiency    Chronic, ongoing.  He is taking supplement, continue this and check  level today.      Relevant Orders   VITAMIN D 25 Hydroxy (Vit-D Deficiency, Fractures)   Other Visit Diagnoses     Medicare annual wellness visit, subsequent    -  Primary   Due for  Medicare Annual Wellness, performed today with patient.   Pneumococcal vaccination given       PCV20 in office today and educated on this.   Relevant Orders   Pneumococcal conjugate vaccine 20-valent (Prevnar 20) (Completed)   Encounter for annual physical exam       Annual physical today with labs and health maintenance reviewed, discussed with patient.       Discussed aspirin prophylaxis for myocardial infarction prevention and decision was it was not indicated  LABORATORY TESTING:  Health maintenance labs ordered today as discussed above.   The natural history of prostate cancer and ongoing controversy regarding screening and potential treatment outcomes of prostate cancer has been discussed with the patient. The meaning of a false positive PSA and a false negative PSA has been discussed. He indicates understanding of the limitations of this screening test and wishes to proceed with screening PSA testing.   IMMUNIZATIONS:   - Tdap: Tetanus vaccination status reviewed: last tetanus booster within 10 years. - Influenza: Up to date - Pneumovax: Up to date - Prevnar: Up to date - Zostavax vaccine: Up to date - Covid: Up To Date  SCREENING: - Colonoscopy: Up to date  Discussed with patient purpose of the colonoscopy is to detect colon cancer at curable precancerous or early stages   - AAA Screening: Not applicable  -Hearing Test: Not applicable  -Spirometry: Not applicable   PATIENT COUNSELING:    Sexuality: Discussed sexually transmitted diseases, partner selection, use of condoms, avoidance of unintended pregnancy  and contraceptive alternatives.   Advised to avoid cigarette smoking.  I discussed with the patient that most people either abstain from alcohol or drink within safe limits  (<=14/week and <=4 drinks/occasion for males, <=7/weeks and <= 3 drinks/occasion for females) and that the risk for alcohol disorders and other health effects rises proportionally with the number of drinks per week and how often a drinker exceeds daily limits.  Discussed cessation/primary prevention of drug use and availability of treatment for abuse.   Diet: Encouraged to adjust caloric intake to maintain  or achieve ideal body weight, to reduce intake of dietary saturated fat and total fat, to limit sodium intake by avoiding high sodium foods and not adding table salt, and to maintain adequate dietary potassium and calcium preferably from fresh fruits, vegetables, and low-fat dairy products.    Stressed the importance of regular exercise  Injury prevention: Discussed safety belts, safety helmets, smoke detector, smoking near bedding or upholstery.   Dental health: Discussed importance of regular tooth brushing, flossing, and dental visits.   Follow up plan: NEXT PREVENTATIVE PHYSICAL DUE IN 1 YEAR. Return in about 3 months (around 07/03/2022) for T2DM, HTN/HLD.

## 2022-04-02 NOTE — Assessment & Plan Note (Signed)
Chronic, ongoing.  Continue statin daily and adjust dosing as needed. Lipid panel today.

## 2022-04-02 NOTE — Assessment & Plan Note (Signed)
Diagnosed 02/21/21 with A1c 9.9%, today is 7% -- trending up due to diet.  Urine ALB 10 in March 2024, continue Valsartan for kidney protection.  Continue Metformin XR dosing 500 MG BID and recommend he place heavy focus on diet changes over next months to get A1c <7%.  Recommend he check sugars twice a day, first thing in morning with goal <130 and one time two hours after a meal with goal <180.  Educated on diabetic diet and recommend focus on this.  Return in 3 months.

## 2022-04-02 NOTE — Assessment & Plan Note (Signed)
Chronic, stable with Flomax.  Continue current regimen and adjust as needed.  PSA today on labs.

## 2022-04-02 NOTE — Assessment & Plan Note (Addendum)
BMI 38.63 with T2DM, HTN, HLD.  Recommended eating smaller high protein, low fat meals more frequently and exercising 30 mins a day 5 times a week with a goal of 10-15lb weight loss in the next 3 months. Patient voiced their understanding and motivation to adhere to these recommendations.

## 2022-04-02 NOTE — Assessment & Plan Note (Signed)
Chronic, ongoing.  Continue Baclofen, recommend he take this as ordered to prevent issues while walking.  He declines referral to neurology or PT at this time.  Return in 6 months.

## 2022-04-02 NOTE — Assessment & Plan Note (Signed)
Chronic, stable.  BP at goal today in office.  Valsartan for kidney protection, urine ALB 16 March 2022.  Tolerating well, will continue this.  Recommend he monitor BP at least a few mornings a week at home and document.  DASH diet at home.  Continue current medication regimen and adjust as needed. Labs today: CBC, CMP, TSH, urine ALB.  Return in 3 months.

## 2022-04-03 LAB — CBC WITH DIFFERENTIAL/PLATELET
Basophils Absolute: 0 10*3/uL (ref 0.0–0.2)
Basos: 1 %
EOS (ABSOLUTE): 0.1 10*3/uL (ref 0.0–0.4)
Eos: 2 %
Hematocrit: 39.7 % (ref 37.5–51.0)
Hemoglobin: 13.2 g/dL (ref 13.0–17.7)
Immature Grans (Abs): 0 10*3/uL (ref 0.0–0.1)
Immature Granulocytes: 0 %
Lymphocytes Absolute: 2.2 10*3/uL (ref 0.7–3.1)
Lymphs: 43 %
MCH: 31.4 pg (ref 26.6–33.0)
MCHC: 33.2 g/dL (ref 31.5–35.7)
MCV: 94 fL (ref 79–97)
Monocytes Absolute: 0.5 10*3/uL (ref 0.1–0.9)
Monocytes: 9 %
Neutrophils Absolute: 2.3 10*3/uL (ref 1.4–7.0)
Neutrophils: 45 %
Platelets: 192 10*3/uL (ref 150–450)
RBC: 4.21 x10E6/uL (ref 4.14–5.80)
RDW: 12.8 % (ref 11.6–15.4)
WBC: 5.1 10*3/uL (ref 3.4–10.8)

## 2022-04-03 LAB — COMPREHENSIVE METABOLIC PANEL
ALT: 41 IU/L (ref 0–44)
AST: 23 IU/L (ref 0–40)
Albumin/Globulin Ratio: 1.5 (ref 1.2–2.2)
Albumin: 4.2 g/dL (ref 3.9–4.9)
Alkaline Phosphatase: 125 IU/L — ABNORMAL HIGH (ref 44–121)
BUN/Creatinine Ratio: 13 (ref 10–24)
BUN: 12 mg/dL (ref 8–27)
Bilirubin Total: 0.5 mg/dL (ref 0.0–1.2)
CO2: 24 mmol/L (ref 20–29)
Calcium: 9.1 mg/dL (ref 8.6–10.2)
Chloride: 99 mmol/L (ref 96–106)
Creatinine, Ser: 0.9 mg/dL (ref 0.76–1.27)
Globulin, Total: 2.8 g/dL (ref 1.5–4.5)
Glucose: 204 mg/dL — ABNORMAL HIGH (ref 70–99)
Potassium: 4.6 mmol/L (ref 3.5–5.2)
Sodium: 138 mmol/L (ref 134–144)
Total Protein: 7 g/dL (ref 6.0–8.5)
eGFR: 94 mL/min/{1.73_m2} (ref >59)

## 2022-04-03 LAB — PSA: Prostate Specific Ag, Serum: 0.6 ng/mL (ref 0.0–4.0)

## 2022-04-03 LAB — LIPID PANEL W/O CHOL/HDL RATIO
Cholesterol, Total: 99 mg/dL — ABNORMAL LOW (ref 100–199)
HDL: 33 mg/dL — ABNORMAL LOW (ref >39)
LDL Chol Calc (NIH): 50 mg/dL (ref 0–99)
Triglycerides: 78 mg/dL (ref 0–149)
VLDL Cholesterol Cal: 16 mg/dL (ref 5–40)

## 2022-04-03 LAB — VITAMIN D 25 HYDROXY (VIT D DEFICIENCY, FRACTURES): Vit D, 25-Hydroxy: 35.9 ng/mL (ref 30.0–100.0)

## 2022-04-03 LAB — TSH: TSH: 1.67 u[IU]/mL (ref 0.450–4.500)

## 2022-04-04 NOTE — Progress Notes (Signed)
Good afternoon let Ryan Schmidt know his labs have returned: - Kidney and liver function normal.   - Cholesterol labs are normal. - Remainder of labs are all good. Keep being amazing!!  Thank you for allowing me to participate in your care.  I appreciate you. Kindest regards, Izaha Shughart

## 2022-04-08 ENCOUNTER — Ambulatory Visit: Payer: 59 | Admitting: Nurse Practitioner

## 2022-06-29 NOTE — Patient Instructions (Incomplete)
Be Involved in Your Health Care:  Taking Medications When medications are taken as directed, they can greatly improve your health. But if they are not taken as instructed, they may not work. In some cases, not taking them correctly can be harmful. To help ensure your treatment remains effective and safe, understand your medications and how to take them.  Your lab results, notes and after visit summary will be available on My Chart. We strongly encourage you to use this feature. If lab results are abnormal the clinic will contact you with the appropriate steps. If the clinic does not contact you assume the results are satisfactory. You can always see your results on My Chart. If you have questions regarding your condition, please contact the clinic during office hours. You can also ask questions on My Chart.  We at [facility] are grateful that you chose us to provide care. We strive to provide excellent and compassionate care and are always looking for feedback. If you get a survey from the clinic please complete this.   Diabetes Mellitus Basics  Diabetes mellitus, or diabetes, is a long-term (chronic) disease. It occurs when the body does not properly use sugar (glucose) that is released from food after you eat. Diabetes mellitus may be caused by one or both of these problems: Your pancreas does not make enough of a hormone called insulin. Your body does not react in a normal way to the insulin that it makes. Insulin lets glucose enter cells in your body. This gives you energy. If you have diabetes, glucose cannot get into cells. This causes high blood glucose (hyperglycemia). How to treat and manage diabetes You may need to take insulin or other diabetes medicines daily to keep your glucose in balance. If you are prescribed insulin, you will learn how to give yourself insulin by injection. You may need to adjust the amount of insulin you take based on the foods that you eat. You will  need to check your blood glucose levels using a glucose monitor as told by your health care provider. The readings can help determine if you have low or high blood glucose. Generally, you should have these blood glucose levels: Before meals (preprandial): 80-130 mg/dL (4.4-7.2 mmol/L). After meals (postprandial): below 180 mg/dL (10 mmol/L). Hemoglobin A1c (HbA1c) level: less than 7%. Your health care provider will set treatment goals for you. Keep all follow-up visits. This is important. Follow these instructions at home: Diabetes medicines Take your diabetes medicines every day as told by your health care provider. List your diabetes medicines here: Name of medicine: ______________________________ Amount (dose): _______________ Time (a.m./p.m.): _______________ Notes: ___________________________________ Name of medicine: ______________________________ Amount (dose): _______________ Time (a.m./p.m.): _______________ Notes: ___________________________________ Name of medicine: ______________________________ Amount (dose): _______________ Time (a.m./p.m.): _______________ Notes: ___________________________________ Insulin If you use insulin, list the types of insulin you use here: Insulin type: ______________________________ Amount (dose): _______________ Time (a.m./p.m.): _______________Notes: ___________________________________ Insulin type: ______________________________ Amount (dose): _______________ Time (a.m./p.m.): _______________ Notes: ___________________________________ Insulin type: ______________________________ Amount (dose): _______________ Time (a.m./p.m.): _______________ Notes: ___________________________________ Insulin type: ______________________________ Amount (dose): _______________ Time (a.m./p.m.): _______________ Notes: ___________________________________ Insulin type: ______________________________ Amount (dose): _______________ Time (a.m./p.m.): _______________  Notes: ___________________________________ Managing blood glucose  Check your blood glucose levels using a glucose monitor as told by your health care provider. Write down the times that you check your glucose levels here: Time: _______________ Notes: ___________________________________ Time: _______________ Notes: ___________________________________ Time: _______________ Notes: ___________________________________ Time: _______________ Notes: ___________________________________ Time: _______________ Notes: ___________________________________ Time: _______________ Notes: ___________________________________    Low blood glucose Low blood glucose (hypoglycemia) is when glucose is at or below 70 mg/dL (3.9 mmol/L). Symptoms may include: Feeling: Hungry. Sweaty and clammy. Irritable or easily upset. Dizzy. Sleepy. Having: A fast heartbeat. A headache. A change in your vision. Numbness around the mouth, lips, or tongue. Having trouble with: Moving (coordination). Sleeping. Treating low blood glucose To treat low blood glucose, eat or drink something containing sugar right away. If you can think clearly and swallow safely, follow the 15:15 rule: Take 15 grams of a fast-acting carb (carbohydrate), as told by your health care provider. Some fast-acting carbs are: Glucose tablets: take 3-4 tablets. Hard candy: eat 3-5 pieces. Fruit juice: drink 4 oz (120 mL). Regular (not diet) soda: drink 4-6 oz (120-180 mL). Honey or sugar: eat 1 Tbsp (15 mL). Check your blood glucose levels 15 minutes after you take the carb. If your glucose is still at or below 70 mg/dL (3.9 mmol/L), take 15 grams of a carb again. If your glucose does not go above 70 mg/dL (3.9 mmol/L) after 3 tries, get help right away. After your glucose goes back to normal, eat a meal or a snack within 1 hour. Treating very low blood glucose If your glucose is at or below 54 mg/dL (3 mmol/L), you have very low blood glucose  (severe hypoglycemia). This is an emergency. Do not wait to see if the symptoms will go away. Get medical help right away. Call your local emergency services (911 in the [place] Do not drive yourself to the hospital. Questions to ask your health care provider Should I talk with a diabetes educator? What equipment will I need to care for myself at home? What diabetes medicines do I need? When should I take them? How often do I need to check my blood glucose levels? What number can I call if I have questions? When is my follow-up visit? Where can I find a support group for people with diabetes? Where to find more information American Diabetes Association: www.diabetes.org Association of Diabetes Care and Education Specialists: www.diabeteseducator.org Contact a health care provider if: Your blood glucose is at or above 240 mg/dL (13.3 mmol/L) for 2 days in a row. You have been sick or have had a fever for 2 days or more, and you are not getting better. You have any of these problems for more than 6 hours: You cannot eat or drink. You feel nauseous. You vomit. You have diarrhea. Get help right away if: Your blood glucose is lower than 54 mg/dL (3 mmol/L). You get confused. You have trouble thinking clearly. You have trouble breathing. These symptoms may represent a serious problem that is an emergency. Do not wait to see if the symptoms will go away. Get medical help right away. Call your local emergency services (911 in the [place] Do not drive yourself to the hospital. Summary Diabetes mellitus is a chronic disease that occurs when the body does not properly use sugar (glucose) that is released from food after you eat. Take insulin and diabetes medicines as told. Check your blood glucose every day, as often as told. Keep all follow-up visits. This is important. This information is not intended to replace advice given to you by your health care provider. Make sure you discuss any  questions you have with your health care provider. Document Revised: 04/26/2019 Document Reviewed: 04/26/2019 Elsevier Patient Education  2024 [employer]vier Inc.  

## 2022-07-03 ENCOUNTER — Encounter: Payer: Self-pay | Admitting: Nurse Practitioner

## 2022-07-03 ENCOUNTER — Ambulatory Visit (INDEPENDENT_AMBULATORY_CARE_PROVIDER_SITE_OTHER): Payer: 59 | Admitting: Nurse Practitioner

## 2022-07-03 VITALS — BP 132/72 | HR 66 | Temp 98.1°F | Ht 69.0 in | Wt 261.8 lb

## 2022-07-03 DIAGNOSIS — G709 Myoneural disorder, unspecified: Secondary | ICD-10-CM

## 2022-07-03 DIAGNOSIS — E1129 Type 2 diabetes mellitus with other diabetic kidney complication: Secondary | ICD-10-CM

## 2022-07-03 DIAGNOSIS — E1159 Type 2 diabetes mellitus with other circulatory complications: Secondary | ICD-10-CM | POA: Diagnosis not present

## 2022-07-03 DIAGNOSIS — M79671 Pain in right foot: Secondary | ICD-10-CM | POA: Insufficient documentation

## 2022-07-03 DIAGNOSIS — I340 Nonrheumatic mitral (valve) insufficiency: Secondary | ICD-10-CM

## 2022-07-03 DIAGNOSIS — E669 Obesity, unspecified: Secondary | ICD-10-CM

## 2022-07-03 DIAGNOSIS — R809 Proteinuria, unspecified: Secondary | ICD-10-CM

## 2022-07-03 DIAGNOSIS — E1169 Type 2 diabetes mellitus with other specified complication: Secondary | ICD-10-CM

## 2022-07-03 DIAGNOSIS — I152 Hypertension secondary to endocrine disorders: Secondary | ICD-10-CM

## 2022-07-03 DIAGNOSIS — E785 Hyperlipidemia, unspecified: Secondary | ICD-10-CM

## 2022-07-03 DIAGNOSIS — M25562 Pain in left knee: Secondary | ICD-10-CM

## 2022-07-03 LAB — BAYER DCA HB A1C WAIVED: HB A1C (BAYER DCA - WAIVED): 6.6 % — ABNORMAL HIGH (ref 4.8–5.6)

## 2022-07-03 NOTE — Assessment & Plan Note (Signed)
Chronic, stable.  BP at goal today in office.  Valsartan for kidney protection, urine ALB 16 March 2022.  Tolerating well, will continue this.  Recommend he monitor BP at least a few mornings a week at home and document.  DASH diet at home.  Continue current medication regimen and adjust as needed. Labs today: A1c.  Return in 3 months.

## 2022-07-03 NOTE — Assessment & Plan Note (Signed)
Chronic for over one year, ?tendinitis based on exam findings.  Referral to podiatry placed as had had no benefit from OTC medications and pain is starting to affect gait.

## 2022-07-03 NOTE — Assessment & Plan Note (Addendum)
Chronic, ongoing.  Continue statin daily and adjust dosing as needed. Lipid panel up to date. ?

## 2022-07-03 NOTE — Progress Notes (Signed)
BP 132/72   Pulse 66   Temp 98.1 F (36.7 C) (Oral)   Ht 5\' 9"  (1.753 m)   Wt 261 lb 12.8 oz (118.8 kg)   SpO2 94%   BMI 38.66 kg/m    Subjective:    Patient ID: Ryan Schmidt, male    DOB: [birth year], 67 y.o.   MRN: [id number]  HPI: Ryan Schmidt is a 67 y.o. male  Chief Complaint  Patient presents with   Diabetes    Patient declines having a recent Diabetic Eye Exam at today's visit.    Hypertension   Hyperlipidemia   DIABETES Diagnosed on 02/21/21 with A1c 9.9%. March 2024 A1c 7%.  Taking Metformin XR 500 MG BID.  He is working on diet changes, reports he is cutting back on bread and sweets, no potato chips.  Eating popsicles often though. Hypoglycemic episodes:no Polydipsia/polyuria: no Visual disturbance: no Chest pain: no Paresthesias: no Glucose Monitoring: yes             Accucheck frequency: occasionally             Fasting glucose: Wednesday morning 160             Post prandial:             Evening:              Before meals: Taking Insulin?: no             Long acting insulin:             Short acting insulin: Blood Pressure Monitoring: a few times a week Retinal Examination: Up to Date = Dr. Marti Sleigh, going in August Foot Exam: Up to Date Pneumovax: Up to Date Influenza: Up to Date Aspirin: no    HYPERTENSION / HYPERLIPIDEMIA Continues on HCTZ and Atorvastatin daily.  Follows with cardiology, last visit 06/09/22.  Echo 04/02/21 with moderate MR noted, but normal LV function and EF >50%. Satisfied with current treatment? yes Duration of hypertension: chronic BP monitoring frequency: occasionally BP range: 120-130/80 range BP medication side effects: no Duration of hyperlipidemia: chronic Cholesterol medication side effects: no Cholesterol supplements: none Medication compliance: good compliance Aspirin: no Recent stressors: no Recurrent headaches: no Visual changes: no Palpitations: no Dyspnea: no Chest pain: no Lower extremity edema:  no Dizzy/lightheaded: no  The ASCVD Risk score (Arnett DK, et al., 2019) failed to calculate for the following reasons:   The valid total cholesterol range is 130 to 320 mg/dL  KNEE (LEFT) PAIN Has underlying neuromuscular disorder at baseline, continues on Baclofen (currently only taking at night).  Started one week ago.  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  When bends it hurts.  No recent injuries. Duration: weeks Involved knee: left Mechanism of injury: unknown Location:anterior Onset: sudden Severity: 5/10  Quality:  dull and aching, tight feeling Frequency: constant Radiation: no Aggravating factors: prolonged sitting and bending Alleviating factors: rest , hot tub Status: fluctuating Treatments attempted: hot tub, Tylenol  Relief with NSAIDs?:  No NSAIDs Taken Weakness with weight bearing or walking: no Sensation of giving way: no Locking: no Popping: no Bruising: no Swelling: yes Redness: no Paresthesias/decreased sensation: no Fevers: no   FOOT PAIN (RIGHT) Been hurting for a long time -- has thick calluses to area.   Duration: chronic Involved foot: right Mechanism of injury: unknown Location:  Onset: gradual  Severity: 8/10  Quality:  dull, aching, and throbbing Frequency: intermittent Radiation: no Aggravating factors: weight bearing,  walking, and movement  Alleviating factors: rest and soaking it Status: fluctuating Treatments attempted: resting and soaking  Relief with NSAIDs?:  No NSAIDs Taken Weakness with weight bearing or walking: no Morning stiffness: no Swelling: no Redness: no Bruising: no Paresthesias / decreased sensation: no  Fevers:no   Relevant past medical, surgical, family and social history reviewed and updated as indicated. Interim medical history since our last visit reviewed. Allergies and medications reviewed and updated.  Review of Systems  Constitutional:  Negative for activity change, diaphoresis, fatigue and  fever.  Respiratory:  Negative for cough, chest tightness, shortness of breath and wheezing.   Cardiovascular:  Negative for chest pain, palpitations and leg swelling.  Gastrointestinal: Negative.   Musculoskeletal:  Positive for arthralgias.  Neurological: Negative.   Psychiatric/Behavioral: Negative.      Per HPI unless specifically indicated above     Objective:    BP 132/72   Pulse 66   Temp 98.1 F (36.7 C) (Oral)   Ht 5\' 9"  (1.753 m)   Wt 261 lb 12.8 oz (118.8 kg)   SpO2 94%   BMI 38.66 kg/m   Wt Readings from Last 3 Encounters:  07/03/22 261 lb 12.8 oz (118.8 kg)  04/02/22 261 lb 11.2 oz (118.7 kg)  01/07/22 259 lb 11.2 oz (117.8 kg)    Physical Exam Vitals and nursing note reviewed.  Constitutional:      General: He is awake. He is not in acute distress.    Appearance: He is well-developed and well-groomed. He is obese. He is not ill-appearing or toxic-appearing.  HENT:     Head: Normocephalic and atraumatic.     Right Ear: Hearing normal. No drainage.     Left Ear: Hearing normal. No drainage.  Eyes:     General: Lids are normal.        Right eye: No discharge.        Left eye: No discharge.     Conjunctiva/sclera: Conjunctivae normal.     Pupils: Pupils are equal, round, and reactive to light.  Neck:     Thyroid: No thyromegaly.     Vascular: No carotid bruit.  Cardiovascular:     Rate and Rhythm: Normal rate and regular rhythm.     Pulses:          Dorsalis pedis pulses are 2+ on the right side and 2+ on the left side.       Posterior tibial pulses are 2+ on the right side and 2+ on the left side.     Heart sounds: Normal heart sounds, S1 normal and S2 normal. No murmur heard.    No gallop.  Pulmonary:     Effort: Pulmonary effort is normal. No accessory muscle usage or respiratory distress.     Breath sounds: Normal breath sounds.  Abdominal:     General: Bowel sounds are normal.     Palpations: Abdomen is soft.  Musculoskeletal:        General:  Normal range of motion.     Cervical back: Normal range of motion and neck supple.     Right knee: Normal.     Left knee: Swelling (very mild) present. No erythema or crepitus. Normal range of motion. No tenderness. Normal pulse.     Instability Tests: Anterior drawer test negative. Posterior drawer test negative. Medial McMurray test negative and lateral McMurray test negative.     Right lower leg: No edema.     Left lower leg: No edema.  Right foot: Normal range of motion.     Left foot: Normal range of motion.       Feet:  Feet:     Right foot:     Protective Sensation: 10 sites tested.  9 sites sensed.     Skin integrity: Dry skin present.     Toenail Condition: Right toenails are abnormally thick.     Left foot:     Protective Sensation: 10 sites tested.  9 sites sensed.     Skin integrity: Dry skin present.     Toenail Condition: Left toenails are abnormally thick.  Lymphadenopathy:     Cervical: No cervical adenopathy.  Skin:    General: Skin is warm and dry.     Capillary Refill: Capillary refill takes less than 2 seconds.  Neurological:     Mental Status: He is alert and oriented to person, place, and time.     Deep Tendon Reflexes: Reflexes are normal and symmetric.  Psychiatric:        Attention and Perception: Attention normal.        Mood and Affect: Mood normal.        Speech: Speech normal.        Behavior: Behavior normal. Behavior is cooperative.        Thought Content: Thought content normal.     Results for orders placed or performed in visit on 04/02/22  Bayer DCA Hb A1c Waived  Result Value Ref Range   HB A1C (BAYER DCA - WAIVED) 7.0 (H) 4.8 - 5.6 %  Microalbumin, Urine Waived  Result Value Ref Range   Microalb, Ur Waived 10 0 - 19 mg/L   Creatinine, Urine Waived 50 10 - 300 mg/dL   Microalb/Creat Ratio 30-300 (H) <30 mg/g  CBC with Differential/Platelet  Result Value Ref Range   WBC 5.1 3.4 - 10.8 x10E3/uL   RBC 4.21 4.14 - 5.80 x10E6/uL    Hemoglobin 13.2 13.0 - 17.7 g/dL   Hematocrit 30.8 65.7 - 51.0 %   MCV 94 79 - 97 fL   MCH 31.4 26.6 - 33.0 pg   MCHC 33.2 31.5 - 35.7 g/dL   RDW 84.6 96.2 - 95.2 %   Platelets 192 150 - 450 x10E3/uL   Neutrophils 45 Not Estab. %   Lymphs 43 Not Estab. %   Monocytes 9 Not Estab. %   Eos 2 Not Estab. %   Basos 1 Not Estab. %   Neutrophils Absolute 2.3 1.4 - 7.0 x10E3/uL   Lymphocytes Absolute 2.2 0.7 - 3.1 x10E3/uL   Monocytes Absolute 0.5 0.1 - 0.9 x10E3/uL   EOS (ABSOLUTE) 0.1 0.0 - 0.4 x10E3/uL   Basophils Absolute 0.0 0.0 - 0.2 x10E3/uL   Immature Granulocytes 0 Not Estab. %   Immature Grans (Abs) 0.0 0.0 - 0.1 x10E3/uL  Comprehensive metabolic panel  Result Value Ref Range   Glucose 204 (H) 70 - 99 mg/dL   BUN 12 8 - 27 mg/dL   Creatinine, Ser 8.41 0.76 - 1.27 mg/dL   eGFR 94 >32 GM/WNU/2.72   BUN/Creatinine Ratio 13 10 - 24   Sodium 138 134 - 144 mmol/L   Potassium 4.6 3.5 - 5.2 mmol/L   Chloride 99 96 - 106 mmol/L   CO2 24 20 - 29 mmol/L   Calcium 9.1 8.6 - 10.2 mg/dL   Total Protein 7.0 6.0 - 8.5 g/dL   Albumin 4.2 3.9 - 4.9 g/dL   Globulin, Total 2.8 1.5 - 4.5 g/dL  Albumin/Globulin Ratio 1.5 1.2 - 2.2   Bilirubin Total 0.5 0.0 - 1.2 mg/dL   Alkaline Phosphatase 125 (H) 44 - 121 IU/L   AST 23 0 - 40 IU/L   ALT 41 0 - 44 IU/L  Lipid Panel w/o Chol/HDL Ratio  Result Value Ref Range   Cholesterol, Total 99 (L) 100 - 199 mg/dL   Triglycerides 78 0 - 149 mg/dL   HDL 33 (L) >40 mg/dL   VLDL Cholesterol Cal 16 5 - 40 mg/dL   LDL Chol Calc (NIH) 50 0 - 99 mg/dL  TSH  Result Value Ref Range   TSH 1.670 0.450 - 4.500 uIU/mL  PSA  Result Value Ref Range   Prostate Specific Ag, Serum 0.6 0.0 - 4.0 ng/mL  VITAMIN D 25 Hydroxy (Vit-D Deficiency, Fractures)  Result Value Ref Range   Vit D, 25-Hydroxy 35.9 30.0 - 100.0 ng/mL      Assessment & Plan:   Problem List Items Addressed This Visit       Cardiovascular and Mediastinum   Hypertension associated with  diabetes (HCC)    Chronic, stable.  BP at goal today in office.  Valsartan for kidney protection, urine ALB 16 March 2022.  Tolerating well, will continue this.  Recommend he monitor BP at least a few mornings a week at home and document.  DASH diet at home.  Continue current medication regimen and adjust as needed. Labs today: A1c.  Return in 3 months.       Relevant Orders   Bayer DCA Hb A1c Waived   Nonrheumatic mitral valve regurgitation    Noted on echo, continue collaboration with cardiology as needed.        Endocrine   Hyperlipidemia associated with type 2 diabetes mellitus (HCC)    Chronic, ongoing.  Continue statin daily and adjust dosing as needed. Lipid panel up to date.      Relevant Orders   Bayer DCA Hb A1c Waived   Type 2 diabetes mellitus with obesity (HCC)    Diagnosed 02/21/21 with A1c 9.9%, today is 6.6% -- trending down.  Urine ALB 10 in March 2024, continue Valsartan for kidney protection.  Continue Metformin XR dosing 500 MG BID and recommend he place heavy focus on diet changes over next months.  Recommend he check sugars twice a day, first thing in morning with goal <130 and one time two hours after a meal with goal <180.  Educated on diabetic diet and recommend focus on this.  Return in 3 months. - Scheduled for eye exam in August - Foot exam up to date - Vaccinations up to date. - ARB and statin on board      Relevant Orders   Bayer DCA Hb A1c Waived   Type 2 diabetes mellitus with proteinuria (HCC) - Primary    Diagnosed 02/21/21 with A1c 9.9%, today is 6.6% -- trending down.  Urine ALB 10 in March 2024, continue Valsartan for kidney protection.  Continue Metformin XR dosing 500 MG BID and recommend he place heavy focus on diet changes over next months.  Recommend he check sugars twice a day, first thing in morning with goal <130 and one time two hours after a meal with goal <180.  Educated on diabetic diet and recommend focus on this.  Return in 3 months. -  Scheduled for eye exam in August - Foot exam up to date - Vaccinations up to date. - ARB and statin on board      Relevant  Orders   Bayer DCA Hb A1c Waived     Nervous and Auditory   Neuromuscular disorder (HCC)    Chronic, ongoing.  Continue Baclofen, recommend he take this as ordered to prevent issues while walking.  He declines referral to neurology or PT at this time.          Other   Acute pain of left knee    Acute, suspect related to overcompensation related to right foot pain.  Recommend Tylenol as needed + Voltaren gel.  Wear knee brace as needed.  Alternate heat and ice.  If worsening consider imaging.      Morbid obesity (HCC)    BMI 38.66 with T2DM, HTN, HLD.  Recommended eating smaller high protein, low fat meals more frequently and exercising 30 mins a day 5 times a week with a goal of 10-15lb weight loss in the next 3 months. Patient voiced their understanding and motivation to adhere to these recommendations.       Right foot pain    Chronic for over one year, ?tendinitis based on exam findings.  Referral to podiatry placed as had had no benefit from OTC medications and pain is starting to affect gait.      Relevant Orders   Ambulatory referral to Podiatry     Follow up plan: Return in about 3 months (around 10/03/2022) for T2DM, HTN/HLD.

## 2022-07-03 NOTE — Assessment & Plan Note (Signed)
BMI 38.66 with T2DM, HTN, HLD.  Recommended eating smaller high protein, low fat meals more frequently and exercising 30 mins a day 5 times a week with a goal of 10-15lb weight loss in the next 3 months. Patient voiced their understanding and motivation to adhere to these recommendations.

## 2022-07-03 NOTE — Assessment & Plan Note (Signed)
Acute, suspect related to overcompensation related to right foot pain.  Recommend Tylenol as needed + Voltaren gel.  Wear knee brace as needed.  Alternate heat and ice.  If worsening consider imaging.

## 2022-07-03 NOTE — Assessment & Plan Note (Signed)
Diagnosed 02/21/21 with A1c 9.9%, today is 6.6% -- trending down.  Urine ALB 10 in March 2024, continue Valsartan for kidney protection.  Continue Metformin XR dosing 500 MG BID and recommend he place heavy focus on diet changes over next months.  Recommend he check sugars twice a day, first thing in morning with goal <130 and one time two hours after a meal with goal <180.  Educated on diabetic diet and recommend focus on this.  Return in 3 months. - Scheduled for eye exam in August - Foot exam up to date - Vaccinations up to date. - ARB and statin on board

## 2022-07-03 NOTE — Assessment & Plan Note (Signed)
Diagnosed 02/21/21 with A1c 9.9%, today is 6.6% -- trending down.  Urine ALB 10 in March 2024, continue Valsartan for kidney protection.  Continue Metformin XR dosing 500 MG BID and recommend he place heavy focus on diet changes over next months.  Recommend he check sugars twice a day, first thing in morning with goal <130 and one time two hours after a meal with goal <180.  Educated on diabetic diet and recommend focus on this.  Return in 3 months. - Scheduled for eye exam in August - Foot exam up to date - Vaccinations up to date. - ARB and statin on board 

## 2022-07-03 NOTE — Assessment & Plan Note (Addendum)
Chronic, ongoing.  Continue Baclofen, recommend he take this as ordered to prevent issues while walking.  He declines referral to neurology or PT at this time.

## 2022-07-03 NOTE — Assessment & Plan Note (Signed)
Noted on echo, continue collaboration with cardiology as needed. 

## 2022-07-28 ENCOUNTER — Ambulatory Visit: Payer: 59 | Admitting: Podiatry

## 2022-07-30 ENCOUNTER — Encounter: Payer: Self-pay | Admitting: Podiatry

## 2022-07-30 ENCOUNTER — Ambulatory Visit (INDEPENDENT_AMBULATORY_CARE_PROVIDER_SITE_OTHER): Payer: 59 | Admitting: Podiatry

## 2022-07-30 VITALS — BP 162/82 | HR 76

## 2022-07-30 DIAGNOSIS — M21621 Bunionette of right foot: Secondary | ICD-10-CM

## 2022-07-30 NOTE — Patient Instructions (Signed)
More silicone pads can be purchased from:  [email or web address]  

## 2022-07-30 NOTE — Progress Notes (Signed)
  Subjective:  Patient ID: Ryan Schmidt, male    DOB: 1956-01-06,  MRN: [id number]  Chief Complaint  Patient presents with   Foot Pain    "My right foot is giving me a lot of pain." N - callus L - 5th met D - 1 yr O - gradually worse C - tender A - standing long periods, walking T - shave it off - Dr. Jabier Mutton, soak in [place] soap    67 y.o. male presents with the above complaint. History confirmed with patient.   Objective:  Physical Exam: warm, good capillary refill, no trophic changes or ulcerative lesions, normal DP and PT pulses, normal sensory exam, and prominent tailor's bunion right lateral fifth met  Assessment:   1. Tailor's bunionette, right      Plan:  Patient was evaluated and treated and all questions answered.  We discussed etiology and treatment options of tailor's bunion deformity, currently his skin appears to be healing well and advised him to utilize a moisturizing lotion with urea such as O'Keefe's foot cream.  Discussed offloading the area with a silicone pad and this was dispensed.  Discussed long-term surgical correction of the tailor's bunion deformity could offer relief as well.  He will return as needed.  Return if symptoms worsen or fail to improve.

## 2022-08-20 LAB — HM DIABETES EYE EXAM

## 2022-09-28 NOTE — Patient Instructions (Signed)
Be Involved in Caring For Your Health:  Taking Medications When medications are taken as directed, they can greatly improve your health. But if they are not taken as prescribed, they may not work. In some cases, not taking them correctly can be harmful. To help ensure your treatment remains effective and safe, understand your medications and how to take them. Bring your medications to each visit for review by your provider.  Your lab results, notes, and after visit summary will be available on My Chart. We strongly encourage you to use this feature. If lab results are abnormal the clinic will contact you with the appropriate steps. If the clinic does not contact you assume the results are satisfactory. You can always view your results on My Chart. If you have questions regarding your health or results, please contact the clinic during office hours. You can also ask questions on My Chart.  We at [facility] are grateful that you chose [place] to provide your care. We strive to provide evidence-based and compassionate care and are always looking for feedback. If you get a survey from the clinic please complete this so we can hear your opinions.  Diabetes Mellitus and Foot Care Diabetes, also called diabetes mellitus, may cause problems with your feet and legs because of poor blood flow (circulation). Poor circulation may make your skin: Become thinner and drier. Break more easily. Heal more slowly. Peel and crack. You may also have nerve damage (neuropathy). This can cause decreased feeling in your legs and feet. This means that you may not notice minor injuries to your feet that could lead to more serious problems. Finding and treating problems early is the best way to prevent future foot problems. How to care for your feet Foot hygiene  Wash your feet daily with warm water and mild soap. Do not use hot water. Then, pat your feet and the areas between your toes until they are fully dry. Do  not soak your feet. This can dry your skin. Trim your toenails straight across. Do not dig under them or around the cuticle. File the edges of your nails with an emery board or nail file. Apply a moisturizing lotion or petroleum jelly to the skin on your feet and to dry, brittle toenails. Use lotion that does not contain alcohol and is unscented. Do not apply lotion between your toes. Shoes and socks Wear clean socks or stockings every day. Make sure they are not too tight. Do not wear knee-high stockings. These may decrease blood flow to your legs. Wear shoes that fit well and have enough cushioning. Always look in your shoes before you put them on to be sure there are no objects inside. To break in new shoes, wear them for just a few hours a day. This prevents injuries on your feet. Wounds, scrapes, corns, and calluses  Check your feet daily for blisters, cuts, bruises, sores, and redness. If you cannot see the bottom of your feet, use a mirror or ask someone for help. Do not cut off corns or calluses or try to remove them with medicine. If you find a minor scrape, cut, or break in the skin on your feet, keep it and the skin around it clean and dry. You may clean these areas with mild soap and water. Do not clean the area with peroxide, alcohol, or iodine. If you have a wound, scrape, corn, or callus on your foot, look at it several times a day to make sure it  is healing and not infected. Check for: Redness, swelling, or pain. Fluid or blood. Warmth. Pus or a bad smell. General tips Do not cross your legs. This may decrease blood flow to your feet. Do not use heating pads or hot water bottles on your feet. They may burn your skin. If you have lost feeling in your feet or legs, you may not know this is happening until it is too late. Protect your feet from hot and cold by wearing shoes, such as at the beach or on hot pavement. Schedule a complete foot exam at least once a year or more often if  you have foot problems. Report any cuts, sores, or bruises to your health care provider right away. Where to find more information American Diabetes Association: diabetes.org Association of Diabetes Care & Education Specialists: diabeteseducator.org Contact a health care provider if: You have a condition that increases your risk of infection, and you have any cuts, sores, or bruises on your feet. You have an injury that is not healing. You have redness on your legs or feet. You feel burning or tingling in your legs or feet. You have pain or cramps in your legs and feet. Your legs or feet are numb. Your feet always feel cold. You have pain around any toenails. Get help right away if: You have a wound, scrape, corn, or callus on your foot and: You have signs of infection. You have a fever. You have a red line going up your leg. This information is not intended to replace advice given to you by your health care provider. Make sure you discuss any questions you have with your health care provider. Document Revised: 06/26/2021 Document Reviewed: 06/26/2021 Elsevier Patient Education  2024 [employer].

## 2022-10-03 ENCOUNTER — Encounter: Payer: Self-pay | Admitting: Nurse Practitioner

## 2022-10-03 ENCOUNTER — Ambulatory Visit (INDEPENDENT_AMBULATORY_CARE_PROVIDER_SITE_OTHER): Payer: 59 | Admitting: Nurse Practitioner

## 2022-10-03 VITALS — BP 134/78 | HR 78 | Temp 98.4°F | Ht 69.0 in | Wt 264.4 lb

## 2022-10-03 DIAGNOSIS — E1169 Type 2 diabetes mellitus with other specified complication: Secondary | ICD-10-CM

## 2022-10-03 DIAGNOSIS — E669 Obesity, unspecified: Secondary | ICD-10-CM

## 2022-10-03 DIAGNOSIS — G709 Myoneural disorder, unspecified: Secondary | ICD-10-CM

## 2022-10-03 DIAGNOSIS — E785 Hyperlipidemia, unspecified: Secondary | ICD-10-CM

## 2022-10-03 DIAGNOSIS — R809 Proteinuria, unspecified: Secondary | ICD-10-CM

## 2022-10-03 DIAGNOSIS — E1129 Type 2 diabetes mellitus with other diabetic kidney complication: Secondary | ICD-10-CM

## 2022-10-03 DIAGNOSIS — I152 Hypertension secondary to endocrine disorders: Secondary | ICD-10-CM | POA: Diagnosis not present

## 2022-10-03 DIAGNOSIS — Z23 Encounter for immunization: Secondary | ICD-10-CM

## 2022-10-03 DIAGNOSIS — Z7984 Long term (current) use of oral hypoglycemic drugs: Secondary | ICD-10-CM | POA: Diagnosis not present

## 2022-10-03 DIAGNOSIS — E1159 Type 2 diabetes mellitus with other circulatory complications: Secondary | ICD-10-CM

## 2022-10-03 DIAGNOSIS — I340 Nonrheumatic mitral (valve) insufficiency: Secondary | ICD-10-CM

## 2022-10-03 NOTE — Progress Notes (Signed)
BP 134/78 (BP Location: Left Arm, Patient Position: Sitting, Cuff Size: Large)   Pulse 78   Temp 98.4 F (36.9 C) (Oral)   Ht 5\' 9"  (1.753 m)   Wt 264 lb 6.4 oz (119.9 kg)   SpO2 95%   BMI 39.05 kg/m    Subjective:    Patient ID: Ryan Schmidt, male    DOB: [birth year], 67 y.o.   MRN: [id number]  HPI: Ryan Schmidt is a 67 y.o. male  Chief Complaint  Patient presents with   Diabetes    BS have been running high at home, pt brought in a log sheet. DM EXAM requested at [facility] eye   Hypertension   Hyperlipidemia   DIABETES Diagnosed on 02/21/21 when A1c was 9.9%.  Last A1c in June was 6.6%.  Takes Metformin XR 500 MG BID. Works on diet at home.  He thinks it will be higher this time as sugars have been "awful high".  Has been eating a lot of potatoes and bread.  Walks every day -- 30 minutes. Hypoglycemic episodes:no Polydipsia/polyuria: no Visual disturbance: no Chest pain: no Paresthesias: no Glucose Monitoring: yes             Accucheck frequency: daily             Fasting glucose: 183 to 238             Post prandial:             Evening: 117 to 333             Before meals: Taking Insulin?: no             Long acting insulin:             Short acting insulin: Blood Pressure Monitoring: a few times a week Retinal Examination: Up to Date = Dr. Marti Sleigh in September Foot Exam: Up to Date Pneumovax: Up to Date Influenza: Up to Date Aspirin: no    HYPERTENSION / HYPERLIPIDEMIA Continues on HCTZ and Atorvastatin daily. Visits with cardiology, last visit 06/09/22.  Echo 04/02/21 with moderate MR noted, but normal LV function and EF >50%. Satisfied with current treatment? yes Duration of hypertension: chronic BP monitoring frequency: occasionally BP range: 120-130/70-80 range BP medication side effects: no Duration of hyperlipidemia: chronic Cholesterol medication side effects: no Cholesterol supplements: none Medication compliance: good compliance Aspirin: no Recent  stressors: no Recurrent headaches: no Visual changes: no Palpitations: no Dyspnea: no Chest pain: no Lower extremity edema: no Dizzy/lightheaded: no  The ASCVD Risk score (Arnett DK, et al., 2019) failed to calculate for the following reasons:   The valid total cholesterol range is 130 to 320 mg/dL  NEUROMUSCULAR DISORDER Takes Baclofen 10 MG TID with benefit -- he reports only taking at bedtime and in morning, but sometimes takes the extra dose. Pain control status: stable Duration: chronic Location: in calves and up into sides Quality: dull, aching, cramping, and throbbing Current Pain Level: 0/10 Previous Pain Level: 10/10 What Activities task can be accomplished with current medication? Can perform daily ADL's Stool softners/OTC fiber: no  Previous pain specialty evaluation: no Non-narcotic analgesic meds: yes  Relevant past medical, surgical, family and social history reviewed and updated as indicated. Interim medical history since our last visit reviewed. Allergies and medications reviewed and updated.  Review of Systems  Constitutional:  Negative for activity change, diaphoresis, fatigue and fever.  Respiratory:  Negative for cough, chest tightness, shortness of breath and wheezing.  Cardiovascular:  Negative for chest pain, palpitations and leg swelling.  Gastrointestinal: Negative.   Musculoskeletal:  Positive for arthralgias.  Neurological: Negative.   Psychiatric/Behavioral: Negative.      Per HPI unless specifically indicated above     Objective:    BP 134/78 (BP Location: Left Arm, Patient Position: Sitting, Cuff Size: Large)   Pulse 78   Temp 98.4 F (36.9 C) (Oral)   Ht 5\' 9"  (1.753 m)   Wt 264 lb 6.4 oz (119.9 kg)   SpO2 95%   BMI 39.05 kg/m   Wt Readings from Last 3 Encounters:  10/03/22 264 lb 6.4 oz (119.9 kg)  07/03/22 261 lb 12.8 oz (118.8 kg)  04/02/22 261 lb 11.2 oz (118.7 kg)    Physical Exam Vitals and nursing note reviewed.   Constitutional:      General: He is awake. He is not in acute distress.    Appearance: He is well-developed and well-groomed. He is obese. He is not ill-appearing or toxic-appearing.  HENT:     Head: Normocephalic.     Right Ear: Hearing and external ear normal.     Left Ear: Hearing and external ear normal.  Eyes:     General: Lids are normal.     Extraocular Movements: Extraocular movements intact.     Conjunctiva/sclera: Conjunctivae normal.  Neck:     Thyroid: No thyromegaly.     Vascular: No carotid bruit.  Cardiovascular:     Rate and Rhythm: Normal rate and regular rhythm.     Heart sounds: Normal heart sounds. No murmur heard.    No gallop.  Pulmonary:     Effort: No accessory muscle usage or respiratory distress.     Breath sounds: Normal breath sounds.  Abdominal:     General: Bowel sounds are normal. There is no distension.     Palpations: Abdomen is soft.     Tenderness: There is no abdominal tenderness.  Musculoskeletal:     Cervical back: Full passive range of motion without pain.     Right lower leg: No edema.     Left lower leg: No edema.  Lymphadenopathy:     Cervical: No cervical adenopathy.  Skin:    General: Skin is warm.     Capillary Refill: Capillary refill takes less than 2 seconds.  Neurological:     Mental Status: He is alert and oriented to person, place, and time.     Deep Tendon Reflexes: Reflexes are normal and symmetric.     Reflex Scores:      Brachioradialis reflexes are 2+ on the right side and 2+ on the left side.      Patellar reflexes are 2+ on the right side and 2+ on the left side. Psychiatric:        Attention and Perception: Attention normal.        Mood and Affect: Mood normal.        Speech: Speech normal.        Behavior: Behavior normal. Behavior is cooperative.        Thought Content: Thought content normal.    Results for orders placed or performed in visit on 07/03/22  Bayer DCA Hb A1c Waived  Result Value Ref Range    HB A1C (BAYER DCA - WAIVED) 6.6 (H) 4.8 - 5.6 %      Assessment & Plan:   Problem List Items Addressed This Visit       Cardiovascular and Mediastinum   Hypertension associated  with diabetes (HCC)    Chronic, stable.  BP close to goal in office on recheck and at goal on home checks.  Valsartan for kidney protection, urine ALB 16 March 2022.  Tolerating well, will continue this.  Recommend he monitor BP at least a few mornings a week at home and document.  DASH diet at home.  Continue current medication regimen and adjust as needed. Labs today: A1c and CMP.         Relevant Orders   Comprehensive metabolic panel   HgB A1c   Nonrheumatic mitral valve regurgitation    Noted on echo, continue collaboration with cardiology as needed.  Asymptomatic.        Endocrine   Hyperlipidemia associated with type 2 diabetes mellitus (HCC)    Chronic, ongoing.  Continue statin daily and adjust dosing as needed. Lipid panel today.      Relevant Orders   Comprehensive metabolic panel   Lipid Panel w/o Chol/HDL Ratio   HgB A1c   Type 2 diabetes mellitus with obesity (HCC)    Diagnosed 02/21/21 with A1c 9.9%, June 2024 A1c was 6.6%, will recheck today but suspect it will be elevated based on his sugars and will need to adjust medications.  Urine ALB 10 in March 2024, continue Valsartan for kidney protection.  Continue Metformin XR dosing 500 MG BID and we will adjust if needed. Recommend he place heavy focus on diet changes over next months.  Recommend he check sugars twice a day, first thing in morning with goal <130 and one time two hours after a meal with goal <180.  Educated on diabetic diet and recommend focus on this.  Return in 4 weeks, as suspect diet changes will be needed. - Eye exam up to date. - Foot exam up to date - Vaccinations up to date. - ARB and Statin on board      Relevant Orders   Comprehensive metabolic panel   HgB A1c   Type 2 diabetes mellitus with proteinuria (HCC) -  Primary    Diagnosed 02/21/21 with A1c 9.9%, June 2024 A1c was 6.6%, will recheck today but suspect it will be elevated based on his sugars and will need to adjust medications.  Urine ALB 10 in March 2024, continue Valsartan for kidney protection.  Continue Metformin XR dosing 500 MG BID and we will adjust if needed. Recommend he place heavy focus on diet changes over next months.  Recommend he check sugars twice a day, first thing in morning with goal <130 and one time two hours after a meal with goal <180.  Educated on diabetic diet and recommend focus on this.  Return in 4 weeks, as suspect diet changes will be needed. - Eye exam up to date. - Foot exam up to date - Vaccinations up to date. - ARB and Statin on board       Relevant Orders   Comprehensive metabolic panel   HgB A1c     Nervous and Auditory   Neuromuscular disorder (HCC)    Chronic, ongoing.  Continue Baclofen, recommend he take this as ordered to prevent issues while walking.  He declines referral to neurology or PT at this time.          Other   Morbid obesity (HCC)    BMI 39.05 with T2DM, HTN, HLD.  Recommended eating smaller high protein, low fat meals more frequently and exercising 30 mins a day 5 times a week with a goal of 10-15lb weight loss in  the next 3 months. Patient voiced their understanding and motivation to adhere to these recommendations.       Other Visit Diagnoses     Flu vaccine need       Flu vaccine provided today, educated patient.   Relevant Orders   Flu Vaccine Trivalent High Dose (Fluad) (Completed)        Follow up plan: Return in about 4 weeks (around 10/31/2022) for T2DM -- adjusted regimen + 3 month follow-up T2DM, HTN/HLD.

## 2022-10-03 NOTE — Assessment & Plan Note (Signed)
BMI 39.05 with T2DM, HTN, HLD.  Recommended eating smaller high protein, low fat meals more frequently and exercising 30 mins a day 5 times a week with a goal of 10-15lb weight loss in the next 3 months. Patient voiced their understanding and motivation to adhere to these recommendations.

## 2022-10-03 NOTE — Assessment & Plan Note (Signed)
Chronic, stable.  BP close to goal in office on recheck and at goal on home checks.  Valsartan for kidney protection, urine ALB 16 March 2022.  Tolerating well, will continue this.  Recommend he monitor BP at least a few mornings a week at home and document.  DASH diet at home.  Continue current medication regimen and adjust as needed. Labs today: A1c and CMP.

## 2022-10-03 NOTE — Assessment & Plan Note (Addendum)
Noted on echo, continue collaboration with cardiology as needed.  Asymptomatic.

## 2022-10-03 NOTE — Assessment & Plan Note (Signed)
Diagnosed 02/21/21 with A1c 9.9%, June 2024 A1c was 6.6%, will recheck today but suspect it will be elevated based on his sugars and will need to adjust medications.  Urine ALB 10 in March 2024, continue Valsartan for kidney protection.  Continue Metformin XR dosing 500 MG BID and we will adjust if needed. Recommend he place heavy focus on diet changes over next months.  Recommend he check sugars twice a day, first thing in morning with goal <130 and one time two hours after a meal with goal <180.  Educated on diabetic diet and recommend focus on this.  Return in 4 weeks, as suspect diet changes will be needed. - Eye exam up to date. - Foot exam up to date - Vaccinations up to date. - ARB and Statin on board

## 2022-10-03 NOTE — Assessment & Plan Note (Signed)
Chronic, ongoing.  Continue Baclofen, recommend he take this as ordered to prevent issues while walking.  He declines referral to neurology or PT at this time.

## 2022-10-03 NOTE — Assessment & Plan Note (Signed)
Chronic, ongoing.  Continue statin daily and adjust dosing as needed. Lipid panel today. 

## 2022-10-03 NOTE — Assessment & Plan Note (Addendum)
Diagnosed 02/21/21 with A1c 9.9%, June 2024 A1c was 6.6%, will recheck today but suspect it will be elevated based on his sugars and will need to adjust medications.  Urine ALB 10 in March 2024, continue Valsartan for kidney protection.  Continue Metformin XR dosing 500 MG BID and we will adjust if needed. Recommend he place heavy focus on diet changes over next months.  Recommend he check sugars twice a day, first thing in morning with goal <130 and one time two hours after a meal with goal <180.  Educated on diabetic diet and recommend focus on this.  Return in 4 weeks, as suspect diet changes will be needed. - Eye exam up to date. - Foot exam up to date - Vaccinations up to date. - ARB and Statin on board

## 2022-10-04 LAB — COMPREHENSIVE METABOLIC PANEL
ALT: 48 [IU]/L — ABNORMAL HIGH (ref 0–44)
AST: 40 [IU]/L (ref 0–40)
Albumin: 4.4 g/dL (ref 3.9–4.9)
Alkaline Phosphatase: 141 [IU]/L — ABNORMAL HIGH (ref 44–121)
BUN/Creatinine Ratio: 12 (ref 10–24)
BUN: 11 mg/dL (ref 8–27)
Bilirubin Total: 0.7 mg/dL (ref 0.0–1.2)
CO2: 23 mmol/L (ref 20–29)
Calcium: 9.5 mg/dL (ref 8.6–10.2)
Chloride: 97 mmol/L (ref 96–106)
Creatinine, Ser: 0.93 mg/dL (ref 0.76–1.27)
Globulin, Total: 3 g/dL (ref 1.5–4.5)
Glucose: 242 mg/dL — ABNORMAL HIGH (ref 70–99)
Potassium: 4.5 mmol/L (ref 3.5–5.2)
Sodium: 139 mmol/L (ref 134–144)
Total Protein: 7.4 g/dL (ref 6.0–8.5)
eGFR: 89 mL/min/{1.73_m2} (ref >59)

## 2022-10-04 LAB — LIPID PANEL W/O CHOL/HDL RATIO
Cholesterol, Total: 110 mg/dL (ref 100–199)
HDL: 30 mg/dL — ABNORMAL LOW (ref >39)
LDL Chol Calc (NIH): 57 mg/dL (ref 0–99)
Triglycerides: 125 mg/dL (ref 0–149)
VLDL Cholesterol Cal: 23 mg/dL (ref 5–40)

## 2022-10-04 LAB — HEMOGLOBIN A1C
Est. average glucose Bld gHb Est-mCnc: 180 mg/dL
Hgb A1c MFr Bld: 7.9 % — ABNORMAL HIGH (ref 4.8–5.6)

## 2022-10-05 ENCOUNTER — Other Ambulatory Visit: Payer: Self-pay | Admitting: Nurse Practitioner

## 2022-10-05 MED ORDER — METFORMIN HCL ER 500 MG PO TB24: 1000.0000 mg | ORAL_TABLET | Freq: Two times a day (BID) | ORAL | 4 refills | Status: DC

## 2022-10-05 NOTE — Progress Notes (Signed)
Contacted via MyChart   Good morning Alisha, your labs have returned and as expected your A1c has trended up: - A1c is 7.9%, above goal.  I will increase your Metformin to 1000 MG twice a day, if issues with this let me know and we can adjust. Monitor sugars closely and bring to next visit. - Kidney function, creatinine and eGFR, remains normal.  Liver function, AST and ALT, shows mild elevation in ALT -- we need to work on getting sugars down.:) - Cholesterol levels stable, continue statin therapy.  Any questions? Keep being stellar!!  Thank you for allowing me to participate in your care.  I appreciate you. Kindest regards, Jaideep Pollack

## 2022-10-08 ENCOUNTER — Telehealth: Payer: Self-pay | Admitting: Nurse Practitioner

## 2022-10-08 NOTE — Telephone Encounter (Signed)
-----   Message from Dorie Rank [facility] sent at 10/05/2022 10:08 AM EDT ----- Contacted via MyChart   Good morning Ryan Schmidt, your labs have returned and as expected your A1c has trended up: - A1c is 7.9%, above goal.  I will increase your Metformin to 1000 MG twice a day, if issues with this let me know and we can adjust. Monitor sugars closely and bring to next visit. - Kidney function, creatinine and eGFR, remains normal.  Liver function, AST and ALT, shows mild elevation in ALT -- we need to work on getting sugars down.:) - Cholesterol levels stable, continue statin therapy.  Any questions? Keep being stellar!!  Thank you for allowing me to participate in your care.  I appreciate you. Kindest regards, Jolene

## 2022-10-29 ENCOUNTER — Encounter: Payer: Self-pay | Admitting: Nurse Practitioner

## 2022-10-30 ENCOUNTER — Ambulatory Visit: Payer: 59 | Admitting: Nurse Practitioner

## 2022-10-31 ENCOUNTER — Ambulatory Visit: Payer: 59 | Admitting: Nurse Practitioner

## 2022-11-04 ENCOUNTER — Ambulatory Visit: Payer: 59 | Admitting: Nurse Practitioner

## 2022-11-05 ENCOUNTER — Ambulatory Visit (INDEPENDENT_AMBULATORY_CARE_PROVIDER_SITE_OTHER): Payer: 59 | Admitting: Nurse Practitioner

## 2022-11-05 VITALS — BP 126/80 | HR 70 | Temp 98.0°F | Ht 69.0 in | Wt 259.4 lb

## 2022-11-05 DIAGNOSIS — E669 Obesity, unspecified: Secondary | ICD-10-CM

## 2022-11-05 DIAGNOSIS — E1169 Type 2 diabetes mellitus with other specified complication: Secondary | ICD-10-CM

## 2022-11-05 MED ORDER — METFORMIN HCL ER 500 MG PO TB24: 1000.0000 mg | ORAL_TABLET | Freq: Two times a day (BID) | ORAL | 4 refills | Status: DC

## 2022-11-05 NOTE — Patient Instructions (Signed)

## 2022-11-05 NOTE — Progress Notes (Signed)
BP 126/80 (BP Location: Left Arm)   Pulse 70   Temp 98 F (36.7 C) (Oral)   Ht 5\' 9"  (1.753 m)   Wt 259 lb 6.4 oz (117.7 kg)   SpO2 96%   BMI 38.31 kg/m    Subjective:    Patient ID: Ryan Schmidt, male    DOB: 1955-02-03, 67 y.o.   MRN: [id number]  HPI: Ryan Schmidt is a 67 y.o. male  Chief Complaint  Patient presents with   Diabetes    Increased Metformin on 10/03/22 -- however had not picked up   DIABETES September A1c 7.9%.  Increased Metformin on 10/03/22 to 1000 MG, he has not started this and picked up.  Still taking 500 MG BID.  Is walking 16 blocks a day.   Hypoglycemic episodes:no Polydipsia/polyuria: no Visual disturbance: no Chest pain: no Paresthesias: no Glucose Monitoring: yes  Accucheck frequency: Daily  Fasting glucose: 188 to 228  Post prandial:  Evening: 143 to 302  Before meals: Taking Insulin?: no  Long acting insulin:  Short acting insulin: Blood Pressure Monitoring: daily Retinal Examination: Up to Date -- Thurmond Foot Exam: Up to Date Diabetic Education: Not Completed Pneumovax: Up to Date Influenza: Up to Date Aspirin: no   Relevant past medical, surgical, family and social history reviewed and updated as indicated. Interim medical history since our last visit reviewed. Allergies and medications reviewed and updated.  Review of Systems  Constitutional:  Negative for activity change, diaphoresis, fatigue and fever.  Respiratory:  Negative for cough, chest tightness, shortness of breath and wheezing.   Cardiovascular:  Negative for chest pain, palpitations and leg swelling.  Gastrointestinal: Negative.   Neurological: Negative.   Psychiatric/Behavioral: Negative.     Per HPI unless specifically indicated above     Objective:    BP 126/80 (BP Location: Left Arm)   Pulse 70   Temp 98 F (36.7 C) (Oral)   Ht 5\' 9"  (1.753 m)   Wt 259 lb 6.4 oz (117.7 kg)   SpO2 96%   BMI 38.31 kg/m   Wt Readings from Last 3 Encounters:   11/05/22 259 lb 6.4 oz (117.7 kg)  10/03/22 264 lb 6.4 oz (119.9 kg)  07/03/22 261 lb 12.8 oz (118.8 kg)    Physical Exam Vitals and nursing note reviewed.  Constitutional:      General: He is awake. He is not in acute distress.    Appearance: He is well-developed and well-groomed. He is obese. He is not ill-appearing or toxic-appearing.  HENT:     Head: Normocephalic.     Right Ear: Hearing and external ear normal.     Left Ear: Hearing and external ear normal.  Eyes:     General: Lids are normal.     Extraocular Movements: Extraocular movements intact.     Conjunctiva/sclera: Conjunctivae normal.  Neck:     Thyroid: No thyromegaly.     Vascular: No carotid bruit.  Cardiovascular:     Rate and Rhythm: Normal rate and regular rhythm.     Heart sounds: Normal heart sounds. No murmur heard.    No gallop.  Pulmonary:     Effort: No accessory muscle usage or respiratory distress.     Breath sounds: Normal breath sounds.  Abdominal:     General: Bowel sounds are normal. There is no distension.     Palpations: Abdomen is soft.     Tenderness: There is no abdominal tenderness.  Musculoskeletal:     Cervical back:  Full passive range of motion without pain.     Right lower leg: No edema.     Left lower leg: No edema.  Lymphadenopathy:     Cervical: No cervical adenopathy.  Skin:    General: Skin is warm.     Capillary Refill: Capillary refill takes less than 2 seconds.  Neurological:     Mental Status: He is alert and oriented to person, place, and time.     Deep Tendon Reflexes: Reflexes are normal and symmetric.     Reflex Scores:      Brachioradialis reflexes are 2+ on the right side and 2+ on the left side.      Patellar reflexes are 2+ on the right side and 2+ on the left side. Psychiatric:        Attention and Perception: Attention normal.        Mood and Affect: Mood normal.        Speech: Speech normal.        Behavior: Behavior normal. Behavior is cooperative.         Thought Content: Thought content normal.     Results for orders placed or performed in visit on 10/03/22  Comprehensive metabolic panel  Result Value Ref Range   Glucose 242 (H) 70 - 99 mg/dL   BUN 11 8 - 27 mg/dL   Creatinine, Ser 7.56 0.76 - 1.27 mg/dL   eGFR 89 >43 PI/RJJ/8.84   BUN/Creatinine Ratio 12 10 - 24   Sodium 139 134 - 144 mmol/L   Potassium 4.5 3.5 - 5.2 mmol/L   Chloride 97 96 - 106 mmol/L   CO2 23 20 - 29 mmol/L   Calcium 9.5 8.6 - 10.2 mg/dL   Total Protein 7.4 6.0 - 8.5 g/dL   Albumin 4.4 3.9 - 4.9 g/dL   Globulin, Total 3.0 1.5 - 4.5 g/dL   Bilirubin Total 0.7 0.0 - 1.2 mg/dL   Alkaline Phosphatase 141 (H) 44 - 121 IU/L   AST 40 0 - 40 IU/L   ALT 48 (H) 0 - 44 IU/L  Lipid Panel w/o Chol/HDL Ratio  Result Value Ref Range   Cholesterol, Total 110 100 - 199 mg/dL   Triglycerides 166 0 - 149 mg/dL   HDL 30 (L) >06 mg/dL   VLDL Cholesterol Cal 23 5 - 40 mg/dL   LDL Chol Calc (NIH) 57 0 - 99 mg/dL  HgB T0Z  Result Value Ref Range   Hgb A1c MFr Bld 7.9 (H) 4.8 - 5.6 %   Est. average glucose Bld gHb Est-mCnc 180 mg/dL      Assessment & Plan:   Problem List Items Addressed This Visit       Endocrine   Type 2 diabetes mellitus with obesity (HCC) - Primary    Diagnosed 02/21/21 with A1c 9.9%, September A1c 7.9% and he has not started medication changes ordered -- sugars are still elevated on his sheets.  Urine ALB 10 in March 2024, continue Valsartan for kidney protection.  Recommend he pick up Metformin XR dosing 1000 MG BID dosing. Recommend he place heavy focus on diet changes over next months.  Recommend he check sugars twice a day, first thing in morning with goal <130 and one time two hours after a meal with goal <180.  Educated on diabetic diet and recommend focus on this.  Return in 4 weeks. - Eye exam up to date. - Foot exam up to date - Vaccinations up to date. - ARB and  Statin on board      Relevant Medications   metFORMIN (GLUCOPHAGE-XR) 500  MG 24 hr tablet     Follow up plan: Return in about 4 weeks (around 12/03/2022) for T2DM -- increased Metformin.

## 2022-11-05 NOTE — Assessment & Plan Note (Signed)
Diagnosed 02/21/21 with A1c 9.9%, September A1c 7.9% and he has not started medication changes ordered -- sugars are still elevated on his sheets.  Urine ALB 10 in March 2024, continue Valsartan for kidney protection.  Recommend he pick up Metformin XR dosing 1000 MG BID dosing. Recommend he place heavy focus on diet changes over next months.  Recommend he check sugars twice a day, first thing in morning with goal <130 and one time two hours after a meal with goal <180.  Educated on diabetic diet and recommend focus on this.  Return in 4 weeks. - Eye exam up to date. - Foot exam up to date - Vaccinations up to date. - ARB and Statin on board

## 2022-11-21 ENCOUNTER — Telehealth: Payer: Self-pay | Admitting: Nurse Practitioner

## 2022-11-21 MED ORDER — VITAMIN D3 50 MCG (2000 UT) PO CAPS: 2000.0000 [IU] | ORAL_CAPSULE | Freq: Every day | ORAL | 4 refills | Status: AC

## 2022-11-21 NOTE — Telephone Encounter (Signed)
Medication Refill -  Most Recent Primary Care Visit:  Provider: Aura Dials T  Department: [facility]  Visit Type: OFFICE VISIT  Date: 11/05/2022  Medication: Vitamin D3 250mg   Has the patient contacted their pharmacy? No  Is this the correct pharmacy for this prescription? Yes  This is the patient's preferred pharmacy: [facility], [place] - [id number] [place] [address] Phone: [phone] Fax: [phone]  Has the prescription been filled recently? Yes  Is the patient out of the medication? Yes  Has the patient been seen for an appointment in the last year OR does the patient have an upcoming appointment? Yes  Can we respond through MyChart? Yes  Agent: Please be advised that Rx refills may take up to 3 business days. We ask that you follow-up with your pharmacy.

## 2022-11-21 NOTE — Telephone Encounter (Signed)
Routing to provider. Is the patient supposed to be on this?

## 2022-11-21 NOTE — Telephone Encounter (Signed)
Vitamin D3 250mg  not on current list.

## 2022-11-25 ENCOUNTER — Telehealth: Payer: Self-pay

## 2022-11-25 NOTE — Telephone Encounter (Signed)
Alexis from [employer] called to confirm the correct dose for the Vitamin D3 order because patient stated he was taking 250 MG but she received a different dose from the provider. Per Jolene's notes the day the Rx was sent to pharmacy Jon Gills was advised of the following comments: Marjie Skiff, NP    11/21/22  6:54 PM I sent in Vitamin D3 2000 units to take daily which is often what I recommend to take

## 2022-11-27 ENCOUNTER — Other Ambulatory Visit: Payer: Self-pay | Admitting: Nurse Practitioner

## 2022-11-27 NOTE — Telephone Encounter (Signed)
Pt needs a new prescription of the Metformin 500 mg/ 2 pills twice a day.  Total 100 mg two times a day  [facility]  [phone]

## 2022-11-28 MED ORDER — METFORMIN HCL ER 500 MG PO TB24: 1000.0000 mg | ORAL_TABLET | Freq: Two times a day (BID) | ORAL | 1 refills | Status: DC

## 2022-11-28 NOTE — Telephone Encounter (Signed)
Requested Prescriptions  Pending Prescriptions Disp Refills   metFORMIN (GLUCOPHAGE-XR) 500 MG 24 hr tablet 360 tablet 1    Sig: Take 2 tablets (1,000 mg total) by mouth 2 (two) times daily with a meal.     Endocrinology:  Diabetes - Biguanides Passed - 11/27/2022 11:13 AM      Passed - Cr in normal range and within 360 days    Creatinine, Ser  Date Value Ref Range Status  10/03/2022 0.93 0.76 - 1.27 mg/dL Final         Passed - HBA1C is between 0 and 7.9 and within 180 days    HB A1C (BAYER DCA - WAIVED)  Date Value Ref Range Status  07/03/2022 6.6 (H) 4.8 - 5.6 % Final    Comment:             Prediabetes: 5.7 - 6.4          Diabetes: >6.4          Glycemic control for adults with diabetes: <7.0    Hgb A1c MFr Bld  Date Value Ref Range Status  10/03/2022 7.9 (H) 4.8 - 5.6 % Final    Comment:             Prediabetes: 5.7 - 6.4          Diabetes: >6.4          Glycemic control for adults with diabetes: <7.0          Passed - eGFR in normal range and within 360 days    GFR calc Af Amer  Date Value Ref Range Status  02/08/2019 97 >59 mL/min/1.73 Final   GFR, Estimated  Date Value Ref Range Status  06/08/2020 >60 >60 mL/min Final    Comment:    (NOTE) Calculated using the CKD-EPI Creatinine Equation (2021)    eGFR  Date Value Ref Range Status  10/03/2022 89 >59 mL/min/1.73 Final         Passed - B12 Level in normal range and within 720 days    Vitamin B-12  Date Value Ref Range Status  02/21/2021 858 232 - 1,245 pg/mL Final         Passed - Valid encounter within last 6 months    Recent Outpatient Visits           3 weeks ago Type 2 diabetes mellitus with obesity (HCC)   [place] [facility] [place], [place]enton T, NP   1 month ago Type 2 diabetes mellitus with proteinuria (HCC)   [place] [facility] [place], [place] T, NP   4 months ago Type 2 diabetes mellitus with proteinuria (HCC)   [place] [facility]  [place], Corrie Dandy T, NP   8 months ago Medicare annual wellness visit, subsequent   [place] [facility] [place], [place] T, NP   10 months ago Hypertension associated with diabetes (HCC)   [place] [facility] [place], Dorie Rank, NP       Future Appointments             In 1 week [place], Dorie Rank, NP [place] [facility]   In 1 month [place], Pitman T, NP  [facility]            Passed - CBC within normal limits and completed in the last 12 months    WBC  Date Value Ref Range Status  04/02/2022 5.1 3.4 - 10.8 x10E3/uL  Final  06/08/2020 17.9 (H) 4.0 - 10.5 K/uL Final   RBC  Date Value Ref Range Status  04/02/2022 4.21 4.14 - 5.80 x10E6/uL Final  06/08/2020 3.78 (L) 4.22 - 5.81 MIL/uL Final   Hemoglobin  Date Value Ref Range Status  04/02/2022 13.2 13.0 - 17.7 g/dL Final   Hematocrit  Date Value Ref Range Status  04/02/2022 39.7 37.5 - 51.0 % Final   MCHC  Date Value Ref Range Status  04/02/2022 33.2 31.5 - 35.7 g/dL Final  16/10/9602 54.0 30.0 - 36.0 g/dL Final   [facility]  Date Value Ref Range Status  04/02/2022 31.4 26.6 - 33.0 pg Final  06/08/2020 33.1 26.0 - 34.0 pg Final   MCV  Date Value Ref Range Status  04/02/2022 94 79 - 97 fL Final   No results found for: "PLTCOUNTKUC", "LABPLAT", "POCPLA" RDW  Date Value Ref Range Status  04/02/2022 12.8 11.6 - 15.4 % Final

## 2022-12-05 NOTE — Patient Instructions (Signed)

## 2022-12-09 ENCOUNTER — Ambulatory Visit (INDEPENDENT_AMBULATORY_CARE_PROVIDER_SITE_OTHER): Payer: 59 | Admitting: Nurse Practitioner

## 2022-12-09 ENCOUNTER — Encounter: Payer: Self-pay | Admitting: Nurse Practitioner

## 2022-12-09 VITALS — BP 109/66 | HR 94 | Temp 98.2°F | Ht 69.0 in | Wt 259.2 lb

## 2022-12-09 DIAGNOSIS — E669 Obesity, unspecified: Secondary | ICD-10-CM

## 2022-12-09 DIAGNOSIS — Z7984 Long term (current) use of oral hypoglycemic drugs: Secondary | ICD-10-CM

## 2022-12-09 DIAGNOSIS — E1169 Type 2 diabetes mellitus with other specified complication: Secondary | ICD-10-CM | POA: Diagnosis not present

## 2022-12-09 NOTE — Progress Notes (Signed)
BP 109/66   Pulse 94   Temp 98.2 F (36.8 C) (Oral)   Ht 5\' 9"  (1.753 m)   Wt 259 lb 3.2 oz (117.6 kg)   SpO2 94%   BMI 38.28 kg/m    Subjective:    Patient ID: Ryan Schmidt, male    DOB: July 25, 1955, 67 y.o.   MRN: [id number]  HPI: Ryan Schmidt is a 67 y.o. male  Chief Complaint  Patient presents with   Diabetes    4 week f/up- no recent eye exam per patient    DIABETES September A1c 7.9%.  Increased Metformin on 10/03/22 to 1000 MG and he is taking this currently.  Is checking blood sugar regularly, forgot log.  Is trying to stay off the snacks, tries to grab a carrot -- but still buying chips at store. Hypoglycemic episodes:no Polydipsia/polyuria: no Visual disturbance: no Chest pain: no Paresthesias: no Glucose Monitoring: yes  Accucheck frequency: Daily  Fasting glucose: 128 to 160  Post prandial:  Evening: yesterday 147 -- lowest 119 and highest 170   Before meals: Taking Insulin?: no  Long acting insulin:  Short acting insulin: Blood Pressure Monitoring: daily Retinal Examination: Up to Date -- Thurmond Foot Exam: Up to Date Diabetic Education: Not Completed Pneumovax: Up to Date Influenza: Up to Date Aspirin: no   Relevant past medical, surgical, family and social history reviewed and updated as indicated. Interim medical history since our last visit reviewed. Allergies and medications reviewed and updated.  Review of Systems  Constitutional:  Negative for activity change, diaphoresis, fatigue and fever.  Respiratory:  Negative for cough, chest tightness, shortness of breath and wheezing.   Cardiovascular:  Negative for chest pain, palpitations and leg swelling.  Gastrointestinal: Negative.   Neurological: Negative.   Psychiatric/Behavioral: Negative.     Per HPI unless specifically indicated above     Objective:    BP 109/66   Pulse 94   Temp 98.2 F (36.8 C) (Oral)   Ht 5\' 9"  (1.753 m)   Wt 259 lb 3.2 oz (117.6 kg)   SpO2 94%   BMI 38.28  kg/m   Wt Readings from Last 3 Encounters:  12/09/22 259 lb 3.2 oz (117.6 kg)  11/05/22 259 lb 6.4 oz (117.7 kg)  10/03/22 264 lb 6.4 oz (119.9 kg)    Physical Exam Vitals and nursing note reviewed.  Constitutional:      General: He is awake. He is not in acute distress.    Appearance: He is well-developed and well-groomed. He is obese. He is not ill-appearing or toxic-appearing.  HENT:     Head: Normocephalic.     Right Ear: Hearing and external ear normal.     Left Ear: Hearing and external ear normal.  Eyes:     General: Lids are normal.     Extraocular Movements: Extraocular movements intact.     Conjunctiva/sclera: Conjunctivae normal.  Neck:     Thyroid: No thyromegaly.     Vascular: No carotid bruit.  Cardiovascular:     Rate and Rhythm: Normal rate and regular rhythm.     Heart sounds: Normal heart sounds. No murmur heard.    No gallop.  Pulmonary:     Effort: No accessory muscle usage or respiratory distress.     Breath sounds: Normal breath sounds.  Abdominal:     General: Bowel sounds are normal. There is no distension.     Palpations: Abdomen is soft.     Tenderness: There is no abdominal  tenderness.  Musculoskeletal:     Cervical back: Full passive range of motion without pain.     Right lower leg: No edema.     Left lower leg: No edema.  Lymphadenopathy:     Cervical: No cervical adenopathy.  Skin:    General: Skin is warm.     Capillary Refill: Capillary refill takes less than 2 seconds.  Neurological:     Mental Status: He is alert and oriented to person, place, and time.     Deep Tendon Reflexes: Reflexes are normal and symmetric.     Reflex Scores:      Brachioradialis reflexes are 2+ on the right side and 2+ on the left side.      Patellar reflexes are 2+ on the right side and 2+ on the left side. Psychiatric:        Attention and Perception: Attention normal.        Mood and Affect: Mood normal.        Speech: Speech normal.        Behavior:  Behavior normal. Behavior is cooperative.        Thought Content: Thought content normal.    Results for orders placed or performed in visit on 10/03/22  Comprehensive metabolic panel  Result Value Ref Range   Glucose 242 (H) 70 - 99 mg/dL   BUN 11 8 - 27 mg/dL   Creatinine, Ser 8.84 0.76 - 1.27 mg/dL   eGFR 89 >16 SA/YTK/1.60   BUN/Creatinine Ratio 12 10 - 24   Sodium 139 134 - 144 mmol/L   Potassium 4.5 3.5 - 5.2 mmol/L   Chloride 97 96 - 106 mmol/L   CO2 23 20 - 29 mmol/L   Calcium 9.5 8.6 - 10.2 mg/dL   Total Protein 7.4 6.0 - 8.5 g/dL   Albumin 4.4 3.9 - 4.9 g/dL   Globulin, Total 3.0 1.5 - 4.5 g/dL   Bilirubin Total 0.7 0.0 - 1.2 mg/dL   Alkaline Phosphatase 141 (H) 44 - 121 IU/L   AST 40 0 - 40 IU/L   ALT 48 (H) 0 - 44 IU/L  Lipid Panel w/o Chol/HDL Ratio  Result Value Ref Range   Cholesterol, Total 110 100 - 199 mg/dL   Triglycerides 109 0 - 149 mg/dL   HDL 30 (L) >32 mg/dL   VLDL Cholesterol Cal 23 5 - 40 mg/dL   LDL Chol Calc (NIH) 57 0 - 99 mg/dL  HgB T5T  Result Value Ref Range   Hgb A1c MFr Bld 7.9 (H) 4.8 - 5.6 %   Est. average glucose Bld gHb Est-mCnc 180 mg/dL      Assessment & Plan:   Problem List Items Addressed This Visit       Endocrine   Type 2 diabetes mellitus with obesity (HCC) - Primary    Diagnosed 02/21/21 with A1c 9.9%, September A1c 7.9% and he is taking Metformin 1000 MG BID now -- sugars are slowly coming down.  Urine ALB 10 in March 2024, continue Valsartan for kidney protection. Recommend he check sugars twice a day, first thing in morning with goal <130 and one time two hours after a meal with goal <180.  Educated on diabetic diet and recommend focus on this.  Return in 4 weeks. - Eye exam up to date. - Foot exam up to date - Vaccinations up to date. - ARB and Statin on board         Follow up plan: Return in about  4 weeks (around 01/06/2023) for T2DM, HTN/HLD.

## 2022-12-09 NOTE — Assessment & Plan Note (Signed)
Diagnosed 02/21/21 with A1c 9.9%, September A1c 7.9% and he is taking Metformin 1000 MG BID now -- sugars are slowly coming down.  Urine ALB 10 in March 2024, continue Valsartan for kidney protection. Recommend he check sugars twice a day, first thing in morning with goal <130 and one time two hours after a meal with goal <180.  Educated on diabetic diet and recommend focus on this.  Return in 4 weeks. - Eye exam up to date. - Foot exam up to date - Vaccinations up to date. - ARB and Statin on board

## 2023-01-06 ENCOUNTER — Ambulatory Visit: Payer: 59 | Admitting: Nurse Practitioner

## 2023-01-09 NOTE — Patient Instructions (Signed)
 Be Involved in Caring For Your Health:  Taking Medications When medications are taken as directed, they can greatly improve your health. But if they are not taken as prescribed, they may not work. In some cases, not taking them correctly can be harmful. To help ensure your treatment remains effective and safe, understand your medications and how to take them. Bring your medications to each visit for review by your provider.  Your lab results, notes, and after visit summary will be available on My Chart. We strongly encourage you to use this feature. If lab results are abnormal the clinic will contact you with the appropriate steps. If the clinic does not contact you assume the results are satisfactory. You can always view your results on My Chart. If you have questions regarding your health or results, please contact the clinic during office hours. You can also ask questions on My Chart.  We at [facility] are grateful that you chose [place] to provide your care. We strive to provide evidence-based and compassionate care and are always looking for feedback. If you get a survey from the clinic please complete this so we can hear your opinions.  Diabetes Mellitus and Foot Care Diabetes, also called diabetes mellitus, may cause problems with your feet and legs because of poor blood flow (circulation). Poor circulation may make your skin: Become thinner and drier. Break more easily. Heal more slowly. Peel and crack. You may also have nerve damage (neuropathy). This can cause decreased feeling in your legs and feet. This means that you may not notice minor injuries to your feet that could lead to more serious problems. Finding and treating problems early is the best way to prevent future foot problems. How to care for your feet Foot hygiene  Wash your feet daily with warm water and mild soap. Do not use hot water. Then, pat your feet and the areas between your toes until they are fully dry. Do  not soak your feet. This can dry your skin. Trim your toenails straight across. Do not dig under them or around the cuticle. File the edges of your nails with an emery board or nail file. Apply a moisturizing lotion or petroleum jelly to the skin on your feet and to dry, brittle toenails. Use lotion that does not contain alcohol and is unscented. Do not apply lotion between your toes. Shoes and socks Wear clean socks or stockings every day. Make sure they are not too tight. Do not wear knee-high stockings. These may decrease blood flow to your legs. Wear shoes that fit well and have enough cushioning. Always look in your shoes before you put them on to be sure there are no objects inside. To break in new shoes, wear them for just a few hours a day. This prevents injuries on your feet. Wounds, scrapes, corns, and calluses  Check your feet daily for blisters, cuts, bruises, sores, and redness. If you cannot see the bottom of your feet, use a mirror or ask someone for help. Do not cut off corns or calluses or try to remove them with medicine. If you find a minor scrape, cut, or break in the skin on your feet, keep it and the skin around it clean and dry. You may clean these areas with mild soap and water. Do not clean the area with peroxide, alcohol, or iodine. If you have a wound, scrape, corn, or callus on your foot, look at it several times a day to make sure it  is healing and not infected. Check for: Redness, swelling, or pain. Fluid or blood. Warmth. Pus or a bad smell. General tips Do not cross your legs. This may decrease blood flow to your feet. Do not use heating pads or hot water bottles on your feet. They may burn your skin. If you have lost feeling in your feet or legs, you may not know this is happening until it is too late. Protect your feet from hot and cold by wearing shoes, such as at the beach or on hot pavement. Schedule a complete foot exam at least once a year or more often if  you have foot problems. Report any cuts, sores, or bruises to your health care provider right away. Where to find more information American Diabetes Association: diabetes.org Association of Diabetes Care & Education Specialists: diabeteseducator.org Contact a health care provider if: You have a condition that increases your risk of infection, and you have any cuts, sores, or bruises on your feet. You have an injury that is not healing. You have redness on your legs or feet. You feel burning or tingling in your legs or feet. You have pain or cramps in your legs and feet. Your legs or feet are numb. Your feet always feel cold. You have pain around any toenails. Get help right away if: You have a wound, scrape, corn, or callus on your foot and: You have signs of infection. You have a fever. You have a red line going up your leg. This information is not intended to replace advice given to you by your health care provider. Make sure you discuss any questions you have with your health care provider. Document Revised: 06/26/2021 Document Reviewed: 06/26/2021 Elsevier Patient Education  2024 [employer].

## 2023-01-13 ENCOUNTER — Encounter: Payer: Self-pay | Admitting: Nurse Practitioner

## 2023-01-13 ENCOUNTER — Ambulatory Visit (INDEPENDENT_AMBULATORY_CARE_PROVIDER_SITE_OTHER): Payer: 59 | Admitting: Nurse Practitioner

## 2023-01-13 VITALS — BP 110/66 | HR 92 | Temp 98.0°F | Resp 18 | Ht 69.0 in | Wt 261.8 lb

## 2023-01-13 DIAGNOSIS — E785 Hyperlipidemia, unspecified: Secondary | ICD-10-CM

## 2023-01-13 DIAGNOSIS — R809 Proteinuria, unspecified: Secondary | ICD-10-CM

## 2023-01-13 DIAGNOSIS — E1159 Type 2 diabetes mellitus with other circulatory complications: Secondary | ICD-10-CM | POA: Diagnosis not present

## 2023-01-13 DIAGNOSIS — Z7984 Long term (current) use of oral hypoglycemic drugs: Secondary | ICD-10-CM

## 2023-01-13 DIAGNOSIS — I152 Hypertension secondary to endocrine disorders: Secondary | ICD-10-CM

## 2023-01-13 DIAGNOSIS — E1169 Type 2 diabetes mellitus with other specified complication: Secondary | ICD-10-CM | POA: Diagnosis not present

## 2023-01-13 DIAGNOSIS — G709 Myoneural disorder, unspecified: Secondary | ICD-10-CM | POA: Diagnosis not present

## 2023-01-13 DIAGNOSIS — E669 Obesity, unspecified: Secondary | ICD-10-CM

## 2023-01-13 DIAGNOSIS — E1129 Type 2 diabetes mellitus with other diabetic kidney complication: Secondary | ICD-10-CM | POA: Diagnosis not present

## 2023-01-13 DIAGNOSIS — E559 Vitamin D deficiency, unspecified: Secondary | ICD-10-CM

## 2023-01-13 LAB — MICROALBUMIN, URINE WAIVED
Creatinine, Urine Waived: 200 mg/dL (ref 10–300)
Microalb, Ur Waived: 30 mg/L — ABNORMAL HIGH (ref 0–19)
Microalb/Creat Ratio: <30 mg/g (ref <30)

## 2023-01-13 LAB — BAYER DCA HB A1C WAIVED: HB A1C (BAYER DCA - WAIVED): 6.5 % — ABNORMAL HIGH (ref 4.8–5.6)

## 2023-01-13 NOTE — Assessment & Plan Note (Signed)
Chronic, ongoing.  Continue statin daily and adjust dosing as needed. Lipid panel today. 

## 2023-01-13 NOTE — Progress Notes (Signed)
 BP 110/66   Pulse 92   Temp 98 F (36.7 C) (Oral)   Resp 18   Ht 5' 9 (1.753 m)   Wt 261 lb 12.8 oz (118.8 kg)   SpO2 94%   BMI 38.66 kg/m    Subjective:    Patient ID: Ryan Schmidt, male    DOB: 04/21/1955, 68 y.o.   MRN: [id number]  HPI: Ryan Schmidt is a 68 y.o. male  Chief Complaint  Patient presents with   Diabetes    Eye exam requested from [facility]   Hyperlipidemia   Hypertension   DIABETES Diagnosed 02/21/21 when A1c was 9.9%.  September A1c 7.9%.  Takes Metformin  XR 1000 MG BID. Works on diet at home. Stopped drinking sodas.  Taking Metamucil and going for walks daily. Hypoglycemic episodes:no Polydipsia/polyuria: no Visual disturbance: no Chest pain: no Paresthesias: no Glucose Monitoring: yes             Accucheck frequency: daily             Fasting glucose: 115 to 176             Post prandial:             Evening: 101 to 204             Before meals: Taking Insulin?: no             Long acting insulin:             Short acting insulin: Blood Pressure Monitoring: a few times a week Retinal Examination: Up to Date = Dr. Newt in September 2024 Foot Exam: Up to Date Pneumovax: Up to Date Influenza: Up to Date Aspirin: no    HYPERTENSION / HYPERLIPIDEMIA Taking Valsartan  and Atorvastatin  daily. Follows with cardiology, last visit 06/09/22.  Echo 04/02/21 with moderate MR noted, but normal LV function and EF >50%. Satisfied with current treatment? yes Duration of hypertension: chronic BP monitoring frequency: occasionally BP range: 110-130/70-80 range BP medication side effects: no Duration of hyperlipidemia: chronic Cholesterol medication side effects: no Cholesterol supplements: none Medication compliance: good compliance Aspirin: no Recent stressors: no Recurrent headaches: no Visual changes: no Palpitations: no Dyspnea: no Chest pain: no Lower extremity edema: no Dizzy/lightheaded: no  The ASCVD Risk score (Arnett DK, et al., 2019)  failed to calculate for the following reasons:   The valid total cholesterol range is 130 to 320 mg/dL  NEUROMUSCULAR DISORDER Continues Baclofen  10 MG TID with benefit, takes at bedtime and in morning.  Continues Vitamin D  daily. Pain control status: stable Duration: chronic Location: in calves and up into sides Quality: dull, aching, cramping, and throbbing Current Pain Level: 0/10 Previous Pain Level: 10/10 What Activities task can be accomplished with current medication? Can perform daily ADL's Stool softners/OTC fiber: no  Previous pain specialty evaluation: no Non-narcotic analgesic meds: yes  Relevant past medical, surgical, family and social history reviewed and updated as indicated. Interim medical history since our last visit reviewed. Allergies and medications reviewed and updated.  Review of Systems  Constitutional:  Negative for activity change, diaphoresis, fatigue and fever.  Respiratory:  Negative for cough, chest tightness, shortness of breath and wheezing.   Cardiovascular:  Negative for chest pain, palpitations and leg swelling.  Gastrointestinal: Negative.   Neurological: Negative.   Psychiatric/Behavioral: Negative.     Per HPI unless specifically indicated above     Objective:    BP 110/66   Pulse 92   Temp 98  F (36.7 C) (Oral)   Resp 18   Ht 5' 9 (1.753 m)   Wt 261 lb 12.8 oz (118.8 kg)   SpO2 94%   BMI 38.66 kg/m   Wt Readings from Last 3 Encounters:  01/13/23 261 lb 12.8 oz (118.8 kg)  12/09/22 259 lb 3.2 oz (117.6 kg)  11/05/22 259 lb 6.4 oz (117.7 kg)    Physical Exam Vitals and nursing note reviewed.  Constitutional:      General: He is awake. He is not in acute distress.    Appearance: He is well-developed and well-groomed. He is obese. He is not ill-appearing or toxic-appearing.  HENT:     Head: Normocephalic.     Right Ear: Hearing and external ear normal.     Left Ear: Hearing and external ear normal.  Eyes:     General: Lids  are normal.     Extraocular Movements: Extraocular movements intact.     Conjunctiva/sclera: Conjunctivae normal.  Neck:     Thyroid : No thyromegaly.     Vascular: No carotid bruit.  Cardiovascular:     Rate and Rhythm: Normal rate and regular rhythm.     Heart sounds: Normal heart sounds. No murmur heard.    No gallop.  Pulmonary:     Effort: No accessory muscle usage or respiratory distress.     Breath sounds: Normal breath sounds.  Abdominal:     General: Bowel sounds are normal. There is no distension.     Palpations: Abdomen is soft.     Tenderness: There is no abdominal tenderness.  Musculoskeletal:     Cervical back: Full passive range of motion without pain.     Right lower leg: No edema.     Left lower leg: No edema.  Lymphadenopathy:     Cervical: No cervical adenopathy.  Skin:    General: Skin is warm.     Capillary Refill: Capillary refill takes less than 2 seconds.  Neurological:     Mental Status: He is alert and oriented to person, place, and time.     Deep Tendon Reflexes: Reflexes are normal and symmetric.     Reflex Scores:      Brachioradialis reflexes are 2+ on the right side and 2+ on the left side.      Patellar reflexes are 2+ on the right side and 2+ on the left side. Psychiatric:        Attention and Perception: Attention normal.        Mood and Affect: Mood normal.        Speech: Speech normal.        Behavior: Behavior normal. Behavior is cooperative.        Thought Content: Thought content normal.    Diabetic Foot Exam - Simple   Simple Foot Form Visual Inspection No deformities, no ulcerations, no other skin breakdown bilaterally: Yes Sensation Testing Intact to touch and monofilament testing bilaterally: Yes Pulse Check Posterior Tibialis and Dorsalis pulse intact bilaterally: Yes Comments     Results for orders placed or performed in visit on 10/03/22  Comprehensive metabolic panel   Collection Time: 10/03/22  8:25 AM  Result Value  Ref Range   Glucose 242 (H) 70 - 99 mg/dL   BUN 11 8 - 27 mg/dL   Creatinine, Ser 9.06 0.76 - 1.27 mg/dL   eGFR 89 >40 fO/fpw/8.26   BUN/Creatinine Ratio 12 10 - 24   Sodium 139 134 - 144 mmol/L   Potassium 4.5  3.5 - 5.2 mmol/L   Chloride 97 96 - 106 mmol/L   CO2 23 20 - 29 mmol/L   Calcium  9.5 8.6 - 10.2 mg/dL   Total Protein 7.4 6.0 - 8.5 g/dL   Albumin 4.4 3.9 - 4.9 g/dL   Globulin, Total 3.0 1.5 - 4.5 g/dL   Bilirubin Total 0.7 0.0 - 1.2 mg/dL   Alkaline Phosphatase 141 (H) 44 - 121 IU/L   AST 40 0 - 40 IU/L   ALT 48 (H) 0 - 44 IU/L  Lipid Panel w/o Chol/HDL Ratio   Collection Time: 10/03/22  8:25 AM  Result Value Ref Range   Cholesterol, Total 110 100 - 199 mg/dL   Triglycerides 874 0 - 149 mg/dL   HDL 30 (L) >60 mg/dL   VLDL Cholesterol Cal 23 5 - 40 mg/dL   LDL Chol Calc (NIH) 57 0 - 99 mg/dL  HgB J8r   Collection Time: 10/03/22  8:25 AM  Result Value Ref Range   Hgb A1c MFr Bld 7.9 (H) 4.8 - 5.6 %   Est. average glucose Bld gHb Est-mCnc 180 mg/dL      Assessment & Plan:   Problem List Items Addressed This Visit       Cardiovascular and Mediastinum   Hypertension associated with diabetes (HCC)   Chronic, stable.  BP at goal in office today.  Valsartan  for kidney protection, urine ALB 05 February 2023.  Tolerating well, will continue this.  Recommend he monitor BP at least a few mornings a week at home and document.  DASH diet at home.  Continue current medication regimen and adjust as needed. Labs today: A1c and CMP.         Relevant Orders   Bayer DCA Hb A1c Waived   Microalbumin, Urine Waived   Comprehensive metabolic panel     Endocrine   Hyperlipidemia associated with type 2 diabetes mellitus (HCC)   Chronic, ongoing.  Continue statin daily and adjust dosing as needed. Lipid panel today.      Relevant Orders   Bayer DCA Hb A1c Waived   Comprehensive metabolic panel   Lipid Panel w/o Chol/HDL Ratio   Type 2 diabetes mellitus with obesity (HCC)    Diagnosed 02/21/21 with A1c 9.9%.  A1c trending down today to 6.5%, praised for this.  Urine ALB 05 February 2023, continue Valsartan  for kidney protection. Recommend he check sugars twice a day, first thing in morning with goal <130 and one time two hours after a meal with goal <180.  Educated on diabetic diet and recommend focus on this.   - Eye exam up to date. - Foot exam up to date - Vaccinations up to date. - ARB and Statin on board      Relevant Orders   Bayer DCA Hb A1c Waived   Microalbumin, Urine Waived   Comprehensive metabolic panel   Type 2 diabetes mellitus with proteinuria (HCC) - Primary   Diagnosed 02/21/21 with A1c 9.9%.  A1c trending down today to 6.5%, praised for this.  Urine ALB 05 February 2023, continue Valsartan  for kidney protection. Recommend he check sugars twice a day, first thing in morning with goal <130 and one time two hours after a meal with goal <180.  Educated on diabetic diet and recommend focus on this.   - Eye exam up to date. - Foot exam up to date - Vaccinations up to date. - ARB and Statin on board      Relevant Orders  Bayer DCA Hb A1c Waived   Microalbumin, Urine Waived   Comprehensive metabolic panel     Nervous and Auditory   Neuromuscular disorder (HCC)   Chronic, ongoing.  Continue Baclofen , recommend he take this as ordered to prevent issues while walking.  He declines referral to neurology or PT at this time.          Other   Morbid obesity (HCC)   BMI 38.66 with T2DM, HTN, HLD.  Recommended eating smaller high protein, low fat meals more frequently and exercising 30 mins a day 5 times a week with a goal of 10-15lb weight loss in the next 3 months. Patient voiced their understanding and motivation to adhere to these recommendations.       Vitamin D  deficiency   Chronic, ongoing.  He is taking supplement, continue this and check level today.      Relevant Orders   VITAMIN D  25 Hydroxy (Vit-D Deficiency, Fractures)     Follow up  plan: Return in about 3 months (around 04/13/2023) for Annual Physical with diabetes check.

## 2023-01-13 NOTE — Assessment & Plan Note (Signed)
Chronic, ongoing.  He is taking supplement, continue this and check level today. 

## 2023-01-13 NOTE — Assessment & Plan Note (Signed)
 Diagnosed 02/21/21 with A1c 9.9%.  A1c trending down today to 6.5%, praised for this.  Urine ALB 05 February 2023, continue Valsartan  for kidney protection. Recommend he check sugars twice a day, first thing in morning with goal <130 and one time two hours after a meal with goal <180.  Educated on diabetic diet and recommend focus on this.   - Eye exam up to date. - Foot exam up to date - Vaccinations up to date. - ARB and Statin on board

## 2023-01-13 NOTE — Assessment & Plan Note (Signed)
BMI 38.66 with T2DM, HTN, HLD.  Recommended eating smaller high protein, low fat meals more frequently and exercising 30 mins a day 5 times a week with a goal of 10-15lb weight loss in the next 3 months. Patient voiced their understanding and motivation to adhere to these recommendations.

## 2023-01-13 NOTE — Assessment & Plan Note (Signed)
Chronic, ongoing.  Continue Baclofen, recommend he take this as ordered to prevent issues while walking.  He declines referral to neurology or PT at this time.

## 2023-01-13 NOTE — Assessment & Plan Note (Signed)
 Chronic, stable.  BP at goal in office today.  Valsartan  for kidney protection, urine ALB 05 February 2023.  Tolerating well, will continue this.  Recommend he monitor BP at least a few mornings a week at home and document.  DASH diet at home.  Continue current medication regimen and adjust as needed. Labs today: A1c and CMP.

## 2023-01-14 LAB — COMPREHENSIVE METABOLIC PANEL
ALT: 50 [IU]/L — ABNORMAL HIGH (ref 0–44)
AST: 43 [IU]/L — ABNORMAL HIGH (ref 0–40)
Albumin: 4.3 g/dL (ref 3.9–4.9)
Alkaline Phosphatase: 125 [IU]/L — ABNORMAL HIGH (ref 44–121)
BUN/Creatinine Ratio: 12 (ref 10–24)
BUN: 12 mg/dL (ref 8–27)
Bilirubin Total: 0.4 mg/dL (ref 0.0–1.2)
CO2: 24 mmol/L (ref 20–29)
Calcium: 9.4 mg/dL (ref 8.6–10.2)
Chloride: 99 mmol/L (ref 96–106)
Creatinine, Ser: 1 mg/dL (ref 0.76–1.27)
Globulin, Total: 2.9 g/dL (ref 1.5–4.5)
Glucose: 171 mg/dL — ABNORMAL HIGH (ref 70–99)
Potassium: 4.4 mmol/L (ref 3.5–5.2)
Sodium: 138 mmol/L (ref 134–144)
Total Protein: 7.2 g/dL (ref 6.0–8.5)
eGFR: 82 mL/min/{1.73_m2} (ref >59)

## 2023-01-14 LAB — LIPID PANEL W/O CHOL/HDL RATIO
Cholesterol, Total: 110 mg/dL (ref 100–199)
HDL: 30 mg/dL — ABNORMAL LOW (ref >39)
LDL Chol Calc (NIH): 53 mg/dL (ref 0–99)
Triglycerides: 160 mg/dL — ABNORMAL HIGH (ref 0–149)
VLDL Cholesterol Cal: 27 mg/dL (ref 5–40)

## 2023-01-14 LAB — VITAMIN D 25 HYDROXY (VIT D DEFICIENCY, FRACTURES): Vit D, 25-Hydroxy: 70.4 ng/mL (ref 30.0–100.0)

## 2023-01-14 NOTE — Progress Notes (Signed)
 Contacted via MyChart   Good afternoon Ryan Schmidt, your blood work has returned: - Kidney function, creatinine and eGFR, remains normal. Liver function, ALT and AST, plus alkaline phosphatase, continue to show elevations.  Do you still have gall bladder?  Any alcohol or Tylenol  use at home?  I will recheck this next visit and if ongoing elevations get some imaging of the liver. - Remainder of labs overall stable.  No medication changes.  Any questions? Keep being stellar!!  Thank you for allowing me to participate in your care.  I appreciate you. Kindest regards, Voncille Simm

## 2023-02-23 ENCOUNTER — Other Ambulatory Visit: Payer: Self-pay | Admitting: Nurse Practitioner

## 2023-02-23 DIAGNOSIS — E669 Obesity, unspecified: Secondary | ICD-10-CM

## 2023-02-23 DIAGNOSIS — E1169 Type 2 diabetes mellitus with other specified complication: Secondary | ICD-10-CM

## 2023-02-23 NOTE — Telephone Encounter (Unsigned)
Copied from CRM [id number]. Topic: General - Other >> Feb 23, 2023  2:37 PM Everette C wrote: Reason for CRM: Medication Refill - Most Recent Primary Care Visit:  Provider: Aura Dials T Department: [id number] Visit Type: OFFICE VISIT Date: 01/13/2023  Medication: one touch verio test strips   Has the patient contacted their pharmacy? Yes (Agent: If no, request that the patient contact the pharmacy for the refill. If patient does not wish to contact the pharmacy document the reason why and proceed with request.) (Agent: If yes, when and what did the pharmacy advise?)  Is this the correct pharmacy for this prescription? Yes If no, delete pharmacy and type the correct one.  This is the patient's preferred pharmacy:  [facility], [place] - [address] [place] [address] Phone: [phone] Fax: [phone]  Has the prescription been filled recently? Yes  Is the patient out of the medication? No  Has the patient been seen for an appointment in the last year OR does the patient have an upcoming appointment? Yes  Can we respond through MyChart? No  Agent: Please be advised that Rx refills may take up to 3 business days. We ask that you follow-up with your pharmacy.

## 2023-02-24 MED ORDER — ONETOUCH VERIO VI STRP
ORAL_STRIP | 4 refills | Status: DC
Start: 2023-02-24 — End: 2023-08-06

## 2023-02-24 NOTE — Telephone Encounter (Signed)
Requested medication (s) are due for refill today: Yes  Requested medication (s) are on the active medication list: Yes  Last refill:  03/28/21  Future visit scheduled: Yes  Notes to clinic:  Prescription has expired.    Requested Prescriptions  Pending Prescriptions Disp Refills   glucose blood (ONETOUCH VERIO) test strip 300 each 4    Sig: Use to check blood sugar 3 times a day and document results, bring to appointments.  Goal is <130 fasting blood sugar and <180 two hours after meals.     Endocrinology: Diabetes - Testing Supplies Passed - 02/24/2023  2:02 PM      Passed - Valid encounter within last 12 months    Recent Outpatient Visits           1 month ago Type 2 diabetes mellitus with proteinuria (HCC)   [place] [facility] [place], [place] T, NP   2 months ago Type 2 diabetes mellitus with obesity (HCC)   [place] [facility] [place], [place] T, NP   3 months ago Type 2 diabetes mellitus with obesity (HCC)   [place] [facility] [place], [place] T, NP   4 months ago Type 2 diabetes mellitus with proteinuria (HCC)   [place] [facility] [place], [place] T, NP   7 months ago Type 2 diabetes mellitus with proteinuria (HCC)   [place] [facility] [place], Dorie Rank, NP       Future Appointments             In 4 months Cannady, Dorie Rank, NP [place] [facility], [facility]

## 2023-02-26 ENCOUNTER — Other Ambulatory Visit: Payer: Self-pay | Admitting: Nurse Practitioner

## 2023-02-27 NOTE — Telephone Encounter (Signed)
Requested Prescriptions  Refused Prescriptions Disp Refills   valsartan (DIOVAN) 80 MG tablet [Pharmacy Med Name: VALSARTAN 80 MG TAB] 90 tablet 4    Sig: TAKE 1 TABLET BY MOUTH ONCE DAILY     Cardiovascular:  Angiotensin Receptor Blockers Passed - 02/27/2023 12:40 PM      Passed - Cr in normal range and within 180 days    Creatinine, Ser  Date Value Ref Range Status  01/13/2023 1.00 0.76 - 1.27 mg/dL Final         Passed - K in normal range and within 180 days    Potassium  Date Value Ref Range Status  01/13/2023 4.4 3.5 - 5.2 mmol/L Final         Passed - Patient is not pregnant      Passed - Last BP in normal range    BP Readings from Last 1 Encounters:  01/13/23 110/66         Passed - Valid encounter within last 6 months    Recent Outpatient Visits           1 month ago Type 2 diabetes mellitus with proteinuria (HCC)   [place] [facility] [place], [place] T, NP   2 months ago Type 2 diabetes mellitus with obesity (HCC)   [place] [facility] [place], [place] T, NP   3 months ago Type 2 diabetes mellitus with obesity (HCC)   [place] [facility] [place], [place] T, NP   4 months ago Type 2 diabetes mellitus with proteinuria (HCC)   [place] [facility] [place], Jeannette T, NP   7 months ago Type 2 diabetes mellitus with proteinuria (HCC)   [place] [facility] [place], Dorie Rank, NP       Future Appointments             In 4 months Cannady, Dorie Rank, NP [place] [facility], [facility]

## 2023-03-31 ENCOUNTER — Other Ambulatory Visit: Payer: Self-pay | Admitting: Nurse Practitioner

## 2023-04-03 ENCOUNTER — Other Ambulatory Visit: Payer: Self-pay | Admitting: Nurse Practitioner

## 2023-04-06 NOTE — Telephone Encounter (Signed)
 Requested Prescriptions  Pending Prescriptions Disp Refills   baclofen (LIORESAL) 10 MG tablet [Pharmacy Med Name: BACLOFEN 10 MG TABS 10 Tablet] 270 tablet 0    Sig: TAKE 1 TABLET BY MOUTH THREE TIMES DAILY     Analgesics:  Muscle Relaxants - baclofen Failed - 04/06/2023  3:34 PM      Failed - Valid encounter within last 6 months    Recent Outpatient Visits   None     Future Appointments             In 3 months Cannady, Dorie Rank, NP [place] [facility], [facility]            Passed - Cr in normal range and within 180 days    Creatinine, Ser  Date Value Ref Range Status  01/13/2023 1.00 0.76 - 1.27 mg/dL Final         Passed - eGFR is 30 or above and within 180 days    GFR calc Af Amer  Date Value Ref Range Status  02/08/2019 97 >59 mL/min/1.73 Final   GFR, Estimated  Date Value Ref Range Status  06/08/2020 >60 >60 mL/min Final    Comment:    (NOTE) Calculated using the CKD-EPI Creatinine Equation (2021)    eGFR  Date Value Ref Range Status  01/13/2023 82 >59 mL/min/1.73 Final          atorvastatin (LIPITOR) 40 MG tablet [Pharmacy Med Name: ATORVASTATIN 40MG  TABLET 40 Tablet] 90 tablet 0    Sig: TAKE 1 TABLET BY MOUTH DAILY     Cardiovascular:  Antilipid - Statins Failed - 04/06/2023  3:34 PM      Failed - Valid encounter within last 12 months    Recent Outpatient Visits   None     Future Appointments             In 3 months Cannady, Dorie Rank, NP  [facility], [facility]            Failed - Lipid Panel in normal range within the last 12 months    Cholesterol, Total  Date Value Ref Range Status  01/13/2023 110 100 - 199 mg/dL Final   Cholesterol Piccolo, Waived  Date Value Ref Range Status  06/04/2018 118 <200 mg/dL Final    Comment:                            Desirable                <200                         Borderline High      200- 239                         High                     >239    LDL Chol Calc  (NIH)  Date Value Ref Range Status  01/13/2023 53 0 - 99 mg/dL Final   HDL  Date Value Ref Range Status  01/13/2023 30 (L) >39 mg/dL Final   Triglycerides  Date Value Ref Range Status  01/13/2023 160 (H) 0 - 149 mg/dL Final   Triglycerides Piccolo,Waived  Date Value Ref Range Status  06/04/2018 126 <150 mg/dL Final    Comment:  Normal                   <150                         Borderline High     150 - 199                         High                200 - 499                         Very High                >499          Passed - Patient is not pregnant

## 2023-05-28 ENCOUNTER — Other Ambulatory Visit: Payer: Self-pay | Admitting: Nurse Practitioner

## 2023-05-29 NOTE — Telephone Encounter (Signed)
 Requested medications are due for refill today.  yes  Requested medications are on the active medications list.  yes  Last refill. 11/28/2022 #360 1 rf  Future visit scheduled.   yes  Notes to clinic.  Labs are expired - CBC.    Requested Prescriptions  Pending Prescriptions Disp Refills   metFORMIN  (GLUCOPHAGE -XR) 500 MG 24 hr tablet [Pharmacy Med Name: METFORMIN  ER 500MG  TAB 500 Tablet] 360 tablet 11    Sig: TAKE TWO (2) TABLETS BY MOUTH TWICE DAILY WITH A MEAL *NEW DOSE*     Endocrinology:  Diabetes - Biguanides Failed - 05/29/2023  5:54 PM      Failed - B12 Level in normal range and within 720 days    Vitamin B-12  Date Value Ref Range Status  02/21/2021 858 232 - 1,245 pg/mL Final         Failed - Valid encounter within last 6 months    Recent Outpatient Visits   None     Future Appointments             In 1 month Cannady, Lavelle Posey, NP [place] [facility], [facility]            Failed - CBC within normal limits and completed in the last 12 months    WBC  Date Value Ref Range Status  04/02/2022 5.1 3.4 - 10.8 x10E3/uL Final  06/08/2020 17.9 (H) 4.0 - 10.5 K/uL Final   RBC  Date Value Ref Range Status  04/02/2022 4.21 4.14 - 5.80 x10E6/uL Final  06/08/2020 3.78 (L) 4.22 - 5.81 MIL/uL Final   Hemoglobin  Date Value Ref Range Status  04/02/2022 13.2 13.0 - 17.7 g/dL Final   Hematocrit  Date Value Ref Range Status  04/02/2022 39.7 37.5 - 51.0 % Final   MCHC  Date Value Ref Range Status  04/02/2022 33.2 31.5 - 35.7 g/dL Final  91/47/8295 62.1 30.0 - 36.0 g/dL Final   [facility]  Date Value Ref Range Status  04/02/2022 31.4 26.6 - 33.0 pg Final  06/08/2020 33.1 26.0 - 34.0 pg Final   MCV  Date Value Ref Range Status  04/02/2022 94 79 - 97 fL Final   No results found for: "PLTCOUNTKUC", "LABPLAT", "POCPLA" RDW  Date Value Ref Range Status  04/02/2022 12.8 11.6 - 15.4 % Final         Passed - Cr in normal range and within 360 days     Creatinine, Ser  Date Value Ref Range Status  01/13/2023 1.00 0.76 - 1.27 mg/dL Final         Passed - HBA1C is between 0 and 7.9 and within 180 days    HB A1C (BAYER DCA - WAIVED)  Date Value Ref Range Status  01/13/2023 6.5 (H) 4.8 - 5.6 % Final    Comment:             Prediabetes: 5.7 - 6.4          Diabetes: >6.4          Glycemic control for adults with diabetes: <7.0          Passed - eGFR in normal range and within 360 days    GFR calc Af Amer  Date Value Ref Range Status  02/08/2019 97 >59 mL/min/1.73 Final   GFR, Estimated  Date Value Ref Range Status  06/08/2020 >60 >60 mL/min Final    Comment:    (NOTE) Calculated using the CKD-EPI Creatinine Equation (2021)  eGFR  Date Value Ref Range Status  01/13/2023 82 >59 mL/min/1.73 Final

## 2023-06-29 ENCOUNTER — Other Ambulatory Visit: Payer: Self-pay | Admitting: Nurse Practitioner

## 2023-07-01 NOTE — Telephone Encounter (Signed)
 Requested Prescriptions  Pending Prescriptions Disp Refills   atorvastatin  (LIPITOR) 40 MG tablet [Pharmacy Med Name: ATORVASTATIN  40MG  TABLET 40 Tablet] 90 tablet 1    Sig: TAKE 1 TABLET BY MOUTH DAILY     Cardiovascular:  Antilipid - Statins Failed - 07/01/2023 12:14 PM      Failed - Valid encounter within last 12 months    Recent Outpatient Visits   None     Future Appointments             In 1 week Valerio Melanie DASEN, NP [place] [facility]            Failed - Lipid Panel in normal range within the last 12 months    Cholesterol, Total  Date Value Ref Range Status  01/13/2023 110 100 - 199 mg/dL Final   Cholesterol Piccolo, Waived  Date Value Ref Range Status  06/04/2018 118 <200 mg/dL Final    Comment:                            Desirable                <200                         Borderline High      200- 239                         High                     >239    LDL Chol Calc (NIH)  Date Value Ref Range Status  01/13/2023 53 0 - 99 mg/dL Final   HDL  Date Value Ref Range Status  01/13/2023 30 (L) >39 mg/dL Final   Triglycerides  Date Value Ref Range Status  01/13/2023 160 (H) 0 - 149 mg/dL Final   Triglycerides Piccolo,Waived  Date Value Ref Range Status  06/04/2018 126 <150 mg/dL Final    Comment:                            Normal                   <150                         Borderline High     150 - 199                         High                200 - 499                         Very High                >499          Passed - Patient is not pregnant       baclofen  (LIORESAL ) 10 MG tablet [Pharmacy Med Name: BACLOFEN  10 MG TABS 10 Tablet] 270 tablet 0    Sig: TAKE 1 TABLET BY MOUTH 3 TIMES DAILY     Analgesics:  Muscle Relaxants - baclofen  Failed - 07/01/2023 12:14 PM      Failed -  Valid encounter within last 6 months    Recent Outpatient Visits   None     Future Appointments             In 1 week Cannady,  Melanie DASEN, NP [place] [facility], [facility]            Passed - Cr in normal range and within 180 days    Creatinine, Ser  Date Value Ref Range Status  01/13/2023 1.00 0.76 - 1.27 mg/dL Final         Passed - eGFR is 30 or above and within 180 days    GFR calc Af Amer  Date Value Ref Range Status  02/08/2019 97 >59 mL/min/1.73 Final   GFR, Estimated  Date Value Ref Range Status  06/08/2020 >60 >60 mL/min Final    Comment:    (NOTE) Calculated using the CKD-EPI Creatinine Equation (2021)    eGFR  Date Value Ref Range Status  01/13/2023 82 >59 mL/min/1.73 Final

## 2023-07-11 DIAGNOSIS — E119 Type 2 diabetes mellitus without complications: Secondary | ICD-10-CM | POA: Insufficient documentation

## 2023-07-11 DIAGNOSIS — R748 Abnormal levels of other serum enzymes: Secondary | ICD-10-CM | POA: Insufficient documentation

## 2023-07-11 NOTE — Patient Instructions (Signed)
Be Involved in Caring For Your Health:  Taking Medications When medications are taken as directed, they can greatly improve your health. But if they are not taken as prescribed, they may not work. In some cases, not taking them correctly can be harmful. To help ensure your treatment remains effective and safe, understand your medications and how to take them. Bring your medications to each visit for review by your provider.  Your lab results, notes, and after visit summary will be available on My Chart. We strongly encourage you to use this feature. If lab results are abnormal the clinic will contact you with the appropriate steps. If the clinic does not contact you assume the results are satisfactory. You can always view your results on My Chart. If you have questions regarding your health or results, please contact the clinic during office hours. You can also ask questions on My Chart.  We at [facility] are grateful that you chose [place] to provide your care. We strive to provide evidence-based and compassionate care and are always looking for feedback. If you get a survey from the clinic please complete this so we can hear your opinions.  Diabetes Mellitus and Exercise Regular exercise is important for your health, especially if you have diabetes mellitus. Exercise is not just about losing weight. It can also help you increase muscle strength and bone density and reduce body fat and stress. This can help your level of endurance and make you more fit and flexible. Why should I exercise if I have diabetes? Exercise has many benefits for people with diabetes. It can: Help lower and control your blood sugar (glucose). Help your body respond better and become more sensitive to the hormone insulin. Reduce how much insulin your body needs. Lower your risk for heart disease by: Lowering how much "bad" cholesterol and triglycerides you have in your body. Increasing how much "good" cholesterol  you have in your body. Lowering your blood pressure. Lowering your blood glucose levels. What is my activity plan? Your health care provider or an expert trained in diabetes care (certified diabetes educator) can help you make an activity plan. This plan can help you find the type of exercise that works for you. It may also tell you how often to exercise and for how long. Be sure to: Get at least 150 minutes of medium-intensity or high-intensity exercise each week. This may involve brisk walking, biking, or water aerobics. Do stretching and strengthening exercises at least 2 times a week. This may involve yoga or weight lifting. Spread out your activity over at least 3 days of the week. Get some form of physical activity each day. Do not go more than 2 days in a row without some kind of activity. Avoid being inactive for more than 30 minutes at a time. Take frequent breaks to walk or stretch. Choose activities that you enjoy. Set goals that you know you can accomplish. Start slowly and increase the intensity of your exercise over time. How do I manage my diabetes during exercise?  Monitor your blood glucose Check your blood glucose before and after you exercise. If your blood glucose is 240 mg/dL (40.9 mmol/L) or higher before you exercise, check your urine for ketones. These are chemicals created by the liver. If you have ketones in your urine, do not exercise until your blood glucose returns to normal. If your blood glucose is 100 mg/dL (5.6 mmol/L) or lower, eat a snack that has 15-20 grams of carbohydrate in  it. Check your blood glucose 15 minutes after the snack to make sure that your level is above 100 mg/dL (5.6 mmol/L) before you start to exercise. Your risk for low blood glucose (hypoglycemia) goes up during and after exercise. Know the symptoms of this condition and how to treat it. Follow these instructions at home: Keep a carbohydrate snack on hand for use before, during, and after  exercise. This can help prevent or treat hypoglycemia. Avoid injecting insulin into parts of your body that are going to be used during exercise. This may include: Your arms, when you are going to play tennis. Your legs, when you are about to go jogging. Keep track of your exercise habits. This can help you and your health care provider watch and adjust your activity plan. Write down: What you eat before and after you exercise. Blood glucose levels before and after you exercise. The type and amount of exercise you do. Talk to your health care provider before you start a new activity. They may need to: Make sure that the activity is safe for you. Adjust your insulin, other medicines, and food that you eat. Drink water while you exercise. This can stop you from losing too much water (dehydration). It can also prevent problems caused by having a lot of heat in your body (heat stroke). Where to find more information American Diabetes Association: diabetes.org Association of Diabetes Care & Education Specialists: diabeteseducator.org This information is not intended to replace advice given to you by your health care provider. Make sure you discuss any questions you have with your health care provider. Document Revised: 06/12/2021 Document Reviewed: 06/12/2021 Elsevier Patient Education  2024 [employer].

## 2023-07-14 ENCOUNTER — Ambulatory Visit (INDEPENDENT_AMBULATORY_CARE_PROVIDER_SITE_OTHER): Payer: Self-pay | Admitting: Nurse Practitioner

## 2023-07-14 ENCOUNTER — Encounter: Payer: Self-pay | Admitting: Nurse Practitioner

## 2023-07-14 VITALS — BP 108/65 | HR 96 | Temp 98.5°F | Ht 69.0 in | Wt 254.6 lb

## 2023-07-14 DIAGNOSIS — E1129 Type 2 diabetes mellitus with other diabetic kidney complication: Secondary | ICD-10-CM

## 2023-07-14 DIAGNOSIS — I340 Nonrheumatic mitral (valve) insufficiency: Secondary | ICD-10-CM | POA: Diagnosis not present

## 2023-07-14 DIAGNOSIS — Z0000 Encounter for general adult medical examination without abnormal findings: Secondary | ICD-10-CM | POA: Diagnosis not present

## 2023-07-14 DIAGNOSIS — E119 Type 2 diabetes mellitus without complications: Secondary | ICD-10-CM

## 2023-07-14 DIAGNOSIS — E1169 Type 2 diabetes mellitus with other specified complication: Secondary | ICD-10-CM

## 2023-07-14 DIAGNOSIS — E1159 Type 2 diabetes mellitus with other circulatory complications: Secondary | ICD-10-CM

## 2023-07-14 DIAGNOSIS — N401 Enlarged prostate with lower urinary tract symptoms: Secondary | ICD-10-CM

## 2023-07-14 DIAGNOSIS — I152 Hypertension secondary to endocrine disorders: Secondary | ICD-10-CM | POA: Diagnosis not present

## 2023-07-14 DIAGNOSIS — G709 Myoneural disorder, unspecified: Secondary | ICD-10-CM | POA: Diagnosis not present

## 2023-07-14 DIAGNOSIS — R748 Abnormal levels of other serum enzymes: Secondary | ICD-10-CM | POA: Diagnosis not present

## 2023-07-14 DIAGNOSIS — E669 Obesity, unspecified: Secondary | ICD-10-CM

## 2023-07-14 DIAGNOSIS — R809 Proteinuria, unspecified: Secondary | ICD-10-CM

## 2023-07-14 DIAGNOSIS — E785 Hyperlipidemia, unspecified: Secondary | ICD-10-CM | POA: Diagnosis not present

## 2023-07-14 LAB — BAYER DCA HB A1C WAIVED: HB A1C (BAYER DCA - WAIVED): 6.9 % — ABNORMAL HIGH (ref 4.8–5.6)

## 2023-07-14 MED ORDER — ATORVASTATIN CALCIUM 40 MG PO TABS: 40.0000 mg | ORAL_TABLET | Freq: Every day | ORAL | 3 refills | Status: AC

## 2023-07-14 MED ORDER — BACLOFEN 10 MG PO TABS: 10.0000 mg | ORAL_TABLET | Freq: Three times a day (TID) | ORAL | 3 refills | Status: AC

## 2023-07-14 NOTE — Assessment & Plan Note (Signed)
 Diagnosed 02/21/21 with A1c 9.9%.  A1c slight trend up to 6.9% today, has not been out walking as much.  Urine ALB 05 February 2023, continue Valsartan  for kidney protection. Recommend he check sugars twice a day, first thing in morning with goal <130 and one time two hours after a meal with goal <180.  Educated on diabetic diet and recommend focus on this.  Recommend he schedule with podiatry, who he is established with, to check callus on foot and offer further recommendations. - Eye exam up to date. - Foot exam up to date - Vaccinations up to date. - ARB and Statin on board

## 2023-07-14 NOTE — Progress Notes (Signed)
 BP 108/65   Pulse 96   Temp 98.5 F (36.9 C) (Oral)   Ht 5' 9 (1.753 m)   Wt 254 lb 9.6 oz (115.5 kg)   SpO2 94%   BMI 37.60 kg/m    Subjective:    Patient ID: Ryan Schmidt, male    DOB: Jan 11, 1955, 68 y.o.   MRN: [id number]  HPI: Ryan Schmidt is a 68 y.o. male  Chief Complaint  Patient presents with   Benign Prostatic Hypertrophy   Diabetes   Hyperlipidemia   Hypertension   DIABETES Diagnosed 02/21/21 when A1c was 9.9%. Last A1c in January was 6.5%.  Continues Metformin  XR 1000 MG BID. Works on diet at home. Has lost 7 lbs since last visit. Having issues with right foot, a callus keeps forming.  He has tried care at home, but it returns and is painful to walk further distances. Last saw podiatry 07/30/22. Hypoglycemic episodes:no Polydipsia/polyuria: no Visual disturbance: no Chest pain: no Paresthesias: no Glucose Monitoring: yes             Accucheck frequency: daily             Fasting glucose: 110 to 178 -- occasional 180 levels             Post prandial:             Evening: 100 to 185 range, has had some higher levels recently             Before meals: Taking Insulin?: no             Long acting insulin:             Short acting insulin: Blood Pressure Monitoring: a few times a week Retinal Examination: Up to Date = Dr. Newt New Exam: Up to Date Pneumovax: Up to Date Influenza: Up to Date Aspirin: no    HYPERTENSION / HYPERLIPIDEMIA Continues on Valsartan  and Atorvastatin  daily. Saw cardiology last on 04/23/23, they did a stress test.  Echo 04/20/23 with trivial MR noted, but normal LV function and EF >50%. History alkaline phosphatase elevations.  Satisfied with current treatment? yes Duration of hypertension: chronic BP monitoring frequency: occasionally BP range: 110-130/70-80 range BP medication side effects: no Duration of hyperlipidemia: chronic Cholesterol medication side effects: no Cholesterol supplements: none Medication compliance: good  compliance Aspirin: no Recent stressors: no Recurrent headaches: no Visual changes: no Palpitations: no Dyspnea: no Chest pain: no Lower extremity edema: no Dizzy/lightheaded: no  The ASCVD Risk score (Arnett DK, et al., 2019) failed to calculate for the following reasons:   The valid total cholesterol range is 130 to 320 mg/dL  NEUROMUSCULAR DISORDER Taking Baclofen  10 MG TID with benefit, takes at bedtime and in morning.  Continues Vitamin D  daily. Pain control status: stable Duration: chronic Location: in calves and up into sides Quality: dull, aching, cramping, and throbbing Current Pain Level: 0/10 Previous Pain Level: 10/10 What Activities task can be accomplished with current medication? Can perform daily ADL's Stool softners/OTC fiber: no  Previous pain specialty evaluation: no Non-narcotic analgesic meds: yes  BPH Continues Flomax . BPH status: stable Satisfied with current treatment?: yes Medication side effects: no Medication compliance: good compliance Duration: chronic Nocturia: 2-3x per night Urinary frequency:drinks a lot of water, so urinates more Incomplete voiding: no Urgency: yes Weak urinary stream: no Straining to start stream: no Dysuria: no Onset: gradual Severity: mild Alleviating factors: Flomax  Aggravating factors: drinking more water Treatments attempted: Flomax   Relevant past medical, surgical, family and social history reviewed and updated as indicated. Interim medical history since our last visit reviewed. Allergies and medications reviewed and updated.  Review of Systems  Constitutional:  Negative for activity change, diaphoresis, fatigue and fever.  Respiratory:  Negative for cough, chest tightness, shortness of breath and wheezing.   Cardiovascular:  Negative for chest pain, palpitations and leg swelling.  Gastrointestinal: Negative.   Neurological: Negative.   Psychiatric/Behavioral: Negative.     Per HPI unless specifically  indicated above     Objective:    BP 108/65   Pulse 96   Temp 98.5 F (36.9 C) (Oral)   Ht 5' 9 (1.753 m)   Wt 254 lb 9.6 oz (115.5 kg)   SpO2 94%   BMI 37.60 kg/m   Wt Readings from Last 3 Encounters:  07/14/23 254 lb 9.6 oz (115.5 kg)  01/13/23 261 lb 12.8 oz (118.8 kg)  12/09/22 259 lb 3.2 oz (117.6 kg)    Physical Exam Vitals and nursing note reviewed.  Constitutional:      General: He is awake. He is not in acute distress.    Appearance: He is well-developed and well-groomed. He is obese. He is not ill-appearing or toxic-appearing.  HENT:     Head: Normocephalic.     Right Ear: Hearing and external ear normal.     Left Ear: Hearing and external ear normal.  Eyes:     General: Lids are normal.     Extraocular Movements: Extraocular movements intact.     Conjunctiva/sclera: Conjunctivae normal.  Neck:     Thyroid : No thyromegaly.     Vascular: No carotid bruit.  Cardiovascular:     Rate and Rhythm: Normal rate and regular rhythm.     Pulses:          Dorsalis pedis pulses are 2+ on the right side and 2+ on the left side.       Posterior tibial pulses are 2+ on the right side and 2+ on the left side.     Heart sounds: Normal heart sounds. No murmur heard.    No gallop.  Pulmonary:     Effort: No accessory muscle usage or respiratory distress.     Breath sounds: Normal breath sounds.  Abdominal:     General: Bowel sounds are normal. There is no distension.     Palpations: Abdomen is soft.     Tenderness: There is no abdominal tenderness.  Musculoskeletal:     Cervical back: Full passive range of motion without pain.     Right lower leg: No edema.     Left lower leg: No edema.     Right foot: Normal range of motion.     Left foot: Normal range of motion.  Feet:     Right foot:     Protective Sensation: 10 sites tested.  10 sites sensed.     Skin integrity: Callus present.     Toenail Condition: Right toenails are abnormally thick.     Left foot:      Protective Sensation: 10 sites tested.  10 sites sensed.     Toenail Condition: Left toenails are abnormally thick.  Lymphadenopathy:     Cervical: No cervical adenopathy.  Skin:    General: Skin is warm.     Capillary Refill: Capillary refill takes less than 2 seconds.  Neurological:     Mental Status: He is alert and oriented to person, place, and time.  Deep Tendon Reflexes: Reflexes are normal and symmetric.     Reflex Scores:      Brachioradialis reflexes are 2+ on the right side and 2+ on the left side.      Patellar reflexes are 2+ on the right side and 2+ on the left side. Psychiatric:        Attention and Perception: Attention normal.        Mood and Affect: Mood normal.        Speech: Speech normal.        Behavior: Behavior normal. Behavior is cooperative.        Thought Content: Thought content normal.    Results for orders placed or performed in visit on 01/14/23  HM DIABETES EYE EXAM   Collection Time: 08/20/22  7:55 AM  Result Value Ref Range   HM Diabetic Eye Exam No Retinopathy No Retinopathy      Assessment & Plan:   Problem List Items Addressed This Visit       Cardiovascular and Mediastinum   Nonrheumatic mitral valve regurgitation   Noted on echo, continue collaboration with cardiology as needed.  Asymptomatic.      Relevant Medications   atorvastatin  (LIPITOR) 40 MG tablet   Hypertension associated with diabetes (HCC)   Chronic, stable.  BP at goal in office today.  Valsartan  for kidney protection, urine ALB 05 February 2023.  Tolerating well, will continue this.  Recommend he monitor BP at least a few mornings a week at home and document.  DASH diet at home.  Continue current medication regimen and adjust as needed. Labs today: CBC, CMP, TSH.       Relevant Medications   atorvastatin  (LIPITOR) 40 MG tablet   Other Relevant Orders   Bayer DCA Hb A1c Waived   CBC with Differential/Platelet   Comprehensive metabolic panel with GFR   TSH      Endocrine   Type 2 diabetes mellitus with proteinuria (HCC)   Diagnosed 02/21/21 with A1c 9.9%.  A1c slight trend up to 6.9% today, has not been out walking as much.  Urine ALB 05 February 2023, continue Valsartan  for kidney protection. Recommend he check sugars twice a day, first thing in morning with goal <130 and one time two hours after a meal with goal <180.  Educated on diabetic diet and recommend focus on this.  Recommend he schedule with podiatry, who he is established with, to check callus on foot and offer further recommendations. - Eye exam up to date. - Foot exam up to date - Vaccinations up to date. - ARB and Statin on board      Relevant Medications   atorvastatin  (LIPITOR) 40 MG tablet   Other Relevant Orders   Bayer DCA Hb A1c Waived   Type 2 diabetes mellitus with obesity (HCC)   Diagnosed 02/21/21 with A1c 9.9%.  A1c slight trend up to 6.9% today, has not been out walking as much.  Urine ALB 05 February 2023, continue Valsartan  for kidney protection. Recommend he check sugars twice a day, first thing in morning with goal <130 and one time two hours after a meal with goal <180.  Educated on diabetic diet and recommend focus on this.  Recommend he schedule with podiatry, who he is established with, to check callus on foot and offer further recommendations. - Eye exam up to date. - Foot exam up to date - Vaccinations up to date. - ARB and Statin on board      Relevant  Medications   atorvastatin  (LIPITOR) 40 MG tablet   Other Relevant Orders   Bayer DCA Hb A1c Waived   Hyperlipidemia associated with type 2 diabetes mellitus (HCC)   Chronic, ongoing.  Continue statin daily and adjust dosing as needed. Lipid panel today.      Relevant Medications   atorvastatin  (LIPITOR) 40 MG tablet   Other Relevant Orders   Bayer DCA Hb A1c Waived   Comprehensive metabolic panel with GFR   Lipid Panel w/o Chol/HDL Ratio   Diabetes mellitus treated with oral medication (HCC)   Refer to  diabetes with obesity plan of care.      Relevant Medications   atorvastatin  (LIPITOR) 40 MG tablet   Other Relevant Orders   Bayer DCA Hb A1c Waived     Nervous and Auditory   Neuromuscular disorder (HCC)   Chronic, ongoing.  Continue Baclofen , recommend he take this as ordered to prevent issues while walking.  He declines referral to neurology or PT at this time.        Relevant Medications   baclofen  (LIORESAL ) 10 MG tablet     Genitourinary   BPH (benign prostatic hyperplasia)   Chronic, stable with Flomax .  Continue current regimen and adjust as needed.  PSA today on labs.      Relevant Orders   PSA     Other   Morbid obesity (HCC)   BMI 37.60 with T2DM, HTN, HLD.  Recommended eating smaller high protein, low fat meals more frequently and exercising 30 mins a day 5 times a week with a goal of 10-15lb weight loss in the next 3 months. Patient voiced their understanding and motivation to adhere to these recommendations.       Elevated alkaline phosphatase level   On past labs, recheck today.      Relevant Orders   Comprehensive metabolic panel with GFR   Gamma GT   Other Visit Diagnoses       Encounter for Medicare annual wellness exam    -  Primary        Follow up plan: Return in about 6 months (around 01/14/2024) for T2DM, HTN/HLD, BPH, MUSCULAR DISORDER.

## 2023-07-14 NOTE — Assessment & Plan Note (Signed)
Chronic, ongoing.  Continue Baclofen, recommend he take this as ordered to prevent issues while walking.  He declines referral to neurology or PT at this time.

## 2023-07-14 NOTE — Assessment & Plan Note (Signed)
Chronic, stable with Flomax.  Continue current regimen and adjust as needed.  PSA today on labs. 

## 2023-07-14 NOTE — Assessment & Plan Note (Signed)
 On past labs, recheck today.

## 2023-07-14 NOTE — Progress Notes (Signed)
 Subjective:   Ryan Schmidt is a 68 y.o. male who presents for Medicare Annual/Subsequent preventive examination.  Visit Complete: In person  Patient Medicare AWV questionnaire was completed by the patient on 07/14/23; I have confirmed that all information answered by patient is correct and no changes since this date.  Cardiac Risk Factors include: advanced age (>53men, >79 women);diabetes mellitus;dyslipidemia;hypertension;male gender;obesity (BMI >30kg/m2)     Objective:    Today's Vitals   07/14/23 0838 07/14/23 0849  BP: 108/65   Pulse: (!) 102   Temp: 98.5 F (36.9 C)   TempSrc: Oral   SpO2: 94%   Weight: 254 lb 9.6 oz (115.5 kg)   Height: 5' 9 (1.753 m)   PainSc: 0-No pain 0-No pain   Body mass index is 37.6 kg/m.     07/14/2023    8:47 AM 10/26/2020    1:49 PM 06/07/2020    3:19 PM 06/07/2020    6:29 AM 05/29/2020    9:35 AM 04/22/2020    7:56 PM 10/21/2019    2:38 PM  Advanced Directives  Does Patient Have a Medical Advance Directive? No No No No No No No  Would patient like information on creating a medical advance directive? No - Patient declined   No - Patient declined No - Patient declined      Current Medications (verified) Outpatient Encounter Medications as of 07/14/2023  Medication Sig   acetaminophen  (TYLENOL ) 500 MG tablet Take 1,000 mg by mouth every 6 (six) hours as needed for mild pain or moderate pain.   Blood Glucose Monitoring Suppl (ONETOUCH VERIO) w/Device KIT Use to check blood sugar 2 times a day and document results, bring to appointments.  Goal is <130 fasting blood sugar and <180 two hours after meals.   Cholecalciferol  (VITAMIN D3) 50 MCG (2000 UT) capsule Take 1 capsule (2,000 Units total) by mouth daily.   glucose blood (ONETOUCH VERIO) test strip Use to check blood sugar 3 times a day and document results, bring to appointments.  Goal is <130 fasting blood sugar and <180 two hours after meals.   Lancets (ONETOUCH ULTRASOFT) lancets Use to  check blood sugar 2 times a day and document results, bring to appointments.  Goal is <130 fasting blood sugar and <180 two hours after meals.   metFORMIN  (GLUCOPHAGE -XR) 500 MG 24 hr tablet TAKE TWO (2) TABLETS BY MOUTH TWICE DAILY WITH A MEAL *NEW DOSE*   tamsulosin  (FLOMAX ) 0.4 MG CAPS capsule TAKE 1 CAPSULE BY MOUTH DAILY AFTER SUPPER   valsartan  (DIOVAN ) 80 MG tablet TAKE 1 TABLET BY MOUTH DAILY   [DISCONTINUED] atorvastatin  (LIPITOR) 40 MG tablet TAKE 1 TABLET BY MOUTH DAILY   [DISCONTINUED] baclofen  (LIORESAL ) 10 MG tablet TAKE 1 TABLET BY MOUTH 3 TIMES DAILY   atorvastatin  (LIPITOR) 40 MG tablet Take 1 tablet (40 mg total) by mouth daily.   baclofen  (LIORESAL ) 10 MG tablet Take 1 tablet (10 mg total) by mouth 3 (three) times daily.   No facility-administered encounter medications on file as of 07/14/2023.    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   Procedure: TOTAL KNEE ARTHROPLASTY - Medford Amber to Assist;  Surgeon: Kathlynn Sharper, MD;  Location: [facility] ORS;  Service: Orthopedics;  Laterality: Left;   Family History  Problem Relation Age of Onset   Cancer Mother    Dementia Sister    Social History   Socioeconomic History   Marital status: Married    Spouse name: Not on file   Number of children: Not on file   Years of education: Not on file   Highest education level: GED or equivalent  Occupational History   Occupation: disability  Tobacco Use   Smoking status: Former   Smokeless tobacco: Never   Tobacco comments:     quit 30 years ago   Vaping Use   Vaping status: Never Used  Substance and Sexual Activity   Alcohol use: Yes    Comment: occasionally   Drug use: Not Currently   Sexual activity: Yes  Other Topics Concern   Not on file  Social History Narrative   Not on file   Social Drivers of Health   Financial Resource Strain: Low Risk  (07/11/2023)   Overall Financial Resource Strain (CARDIA)    Difficulty of Paying Living Expenses: Not hard at all  Food Insecurity: No Food Insecurity (07/11/2023)   Hunger Vital Sign    Worried About Running Out of Food in the Last Year: Never true    Ran Out of Food in the Last Year: Never true  Transportation Needs: No Transportation Needs (07/11/2023)   PRAPARE - Administrator, Civil Service (Medical): No    Lack of Transportation (Non-Medical): No  Physical Activity: Insufficiently Active (07/11/2023)   Exercise Vital Sign    Days of Exercise per Week: 3 days    Minutes of Exercise per Session: 20 min  Stress: No Stress Concern Present (07/11/2023)   [employer] of Occupational Health - Occupational Stress Questionnaire    Feeling of Stress: Only a little  Social Connections: Socially Isolated (07/11/2023)   Social Connection and Isolation Panel    Frequency of Communication with Friends and Family: Once a week    Frequency of Social Gatherings with Friends and Family: Once a week    Attends Religious Services: Patient declined    Database administrator or Organizations: No    Attends Engineer, structural: Not on file    Marital Status: Married    Tobacco Counseling Counseling given: Not Answered Tobacco comments: quit 30 years ago    Clinical Intake:  Pre-visit preparation completed: Yes  Pain : No/denies pain Pain Score: 0-No pain     BMI - recorded: 37.58 Nutritional Status: BMI > 30  Obese Nutritional Risks: None Diabetes: Yes CBG done?: Yes CBG resulted in Enter/ Edit results?: No Did pt. bring in CBG monitor  from home?: No  How often do you need to have someone help you when you read instructions, pamphlets, or other written materials from your doctor or pharmacy?: 1 - Never What is the last grade level you completed in school?: 12th grade  Interpreter Needed?: No  Information entered by :: Laymon Metro, CMA   Activities of Daily Living    07/14/2023    8:48 AM  In your present state of health, do you have any difficulty performing the following activities:  Hearing? 0  Vision? 0  Difficulty concentrating or making decisions? 0  Walking or  climbing stairs? 0  Dressing or bathing? 0  Doing errands, shopping? 0  Preparing Food and eating ? N  Using the Toilet? N  In the past six months, have you accidently leaked urine? N  Do you have problems with loss of bowel control? N  Managing your Medications? N  Managing your Finances? N  Housekeeping or managing your Housekeeping? N    Patient Care Team: Cannady, Jolene T, NP as PCP - General (Nurse Practitioner)  Indicate any recent Medical Services you may have received from other than [facility] providers in the past year (date may be approximate).     Assessment:   This is a routine wellness examination for Kewan.  Hearing/Vision screen No results found.   Goals Addressed   None    Depression Screen    07/14/2023    8:55 AM 01/13/2023    8:54 AM 12/09/2022    9:17 AM 10/03/2022    7:59 AM 07/03/2022    9:46 AM 04/02/2022    1:23 PM 01/07/2022   10:37 AM  PHQ 2/9 Scores  PHQ - 2 Score 1 1 0 0 0 0 0  PHQ- 9 Score 1 3 2  0 2 2 3     Fall Risk    07/14/2023    8:53 AM 01/13/2023    8:53 AM 12/09/2022    9:17 AM 10/03/2022    7:59 AM 07/03/2022    9:46 AM  Fall Risk   Falls in the past year? 0 0 0 0 0  Number falls in past yr: 0 0 0 0 0  Injury with Fall? 0 0 0 0 0  Risk for fall due to : No Fall Risks No Fall Risks No Fall Risks No Fall Risks No Fall Risks  Follow up Falls evaluation completed Falls evaluation completed Falls  evaluation completed Falls evaluation completed;Education provided;Falls prevention discussed Falls evaluation completed    MEDICARE RISK AT HOME: Medicare Risk at Home Any stairs in or around the home?: No If so, are there any without handrails?: No Home free of loose throw rugs in walkways, pet beds, electrical cords, etc?: Yes Adequate lighting in your home to reduce risk of falls?: Yes Life alert?: No Use of a cane, walker or w/c?: No Grab bars in the bathroom?: Yes Shower chair or bench in shower?: No Elevated toilet seat or a handicapped toilet?: No  TIMED UP AND GO:  Was the test performed?  Yes  Length of time to ambulate 10 feet: 4 sec Gait steady and fast without use of assistive device    Cognitive Function:        07/14/2023    8:51 AM 04/02/2022    1:33 PM 02/21/2021    9:31 AM 10/26/2020    1:53 PM 10/21/2019    2:42 PM  6CIT Screen  What Year? 0 points 0 points 0 points 0 points 0 points  What month? 0 points 0 points 0 points 0 points 0 points  What time? 0 points 0 points 0 points 0 points 0 points  Count back from 20 0 points 0 points 0 points 0 points 0 points  Months in reverse 0 points 0 points 0 points 4 points 4 points  Repeat phrase 2 points 0 points 2 points 0 points 2 points  Total Score 2 points 0 points 2 points 4 points 6 points    Immunizations Immunization History  Administered Date(s) Administered   Fluad Quad(high Dose 65+) 10/22/2020, 12/04/2021   Fluad  Trivalent(High Dose 65+) 10/03/2022   Influenza,inj,Quad PF,6+ Mos 01/20/2018, 11/05/2018   Influenza-Unspecified 01/20/2018   PFIZER(Purple Top)SARS-COV-2 Vaccination 03/25/2019, 04/15/2019, 12/09/2019, 02/15/2021   PNEUMOCOCCAL CONJUGATE-20 04/02/2022   Pneumococcal Conjugate-13 02/21/2021   Tdap 02/08/2019   Zoster Recombinant(Shingrix ) 07/06/2021, 10/06/2021    TDAP status: Up to date  Flu Vaccine status: Up to date  Pneumococcal vaccine status: Up to date  Covid-19 vaccine  status: Declined, Education has been provided regarding the importance of this vaccine but patient still declined. Advised may receive this vaccine at local pharmacy or Health Dept.or vaccine clinic. Aware to provide a copy of the vaccination record if obtained from local pharmacy or Health Dept. Verbalized acceptance and understanding.  Qualifies for Shingles Vaccine? Yes   Zostavax completed Yes   Shingrix  Completed?: Yes  Screening Tests Health Maintenance  Topic Date Due   HEMOGLOBIN A1C  07/13/2023   COVID-19 Vaccine (5 - 2024-25 season) 07/27/2023 (Originally 09/07/2022)   INFLUENZA VACCINE  08/07/2023   OPHTHALMOLOGY EXAM  08/20/2023   Diabetic kidney evaluation - eGFR measurement  01/13/2024   Diabetic kidney evaluation - Urine ACR  01/13/2024   FOOT EXAM  01/13/2024   Colonoscopy  04/20/2024   Medicare Annual Wellness (AWV)  07/13/2024   DTaP/Tdap/Td (2 - Td or Tdap) 02/07/2029   Pneumococcal Vaccine: 50+ Years  Completed   Hepatitis C Screening  Completed   Zoster Vaccines- Shingrix   Completed   Hepatitis B Vaccines  Aged Out   HPV VACCINES  Aged Out   Meningococcal B Vaccine  Aged Out    Health Maintenance  Health Maintenance Due  Topic Date Due   HEMOGLOBIN A1C  07/13/2023    Colorectal cancer screening: Type of screening: Colonoscopy. Completed 04/20/17. Repeat every 7 years  Lung Cancer Screening: (Low Dose CT Chest recommended if Age 69-80 years, 20 pack-year currently smoking OR have quit w/in 15years.) does not qualify.   Lung Cancer Screening Referral: N/A  Additional Screening:  Hepatitis C Screening: does qualify; Completed 01/20/2018  Vision Screening: Recommended annual ophthalmology exams for early detection of glaucoma and other disorders of the eye. Is the patient up to date with their annual eye exam?  Yes  Who is the provider or what is the name of the office in which the patient attends annual eye exams? [facility] If pt is not  established with a provider, would they like to be referred to a provider to establish care? N/A- patient is already established.   Dental Screening: Recommended annual dental exams for proper oral hygiene  Diabetic Foot Exam: Diabetic Foot Exam: Completed 01/13/23  Community Resource Referral / Chronic Care Management: CRR required this visit?  No   CCM required this visit?  No     Plan:     I have personally reviewed and noted the following in the patient's chart:   Medical and social history Use of alcohol, tobacco or illicit drugs  Current medications and supplements including opioid prescriptions. Patient is not currently taking opioid prescriptions. Functional ability and status Nutritional status Physical activity Advanced directives List of other physicians Hospitalizations, surgeries, and ER visits in previous 12 months Vitals Screenings to include cognitive, depression, and falls Referrals and appointments  In addition, I have reviewed and discussed with patient certain preventive protocols, quality metrics, and best practice recommendations. A written personalized care plan for preventive services as well as general preventive health recommendations were provided to patient.     Laymon LOISE Metro, CMA  07/14/2023   After Visit Summary: (In Person-Printed) AVS printed and given to the patient

## 2023-07-14 NOTE — Assessment & Plan Note (Signed)
Chronic, ongoing.  Continue statin daily and adjust dosing as needed. Lipid panel today. 

## 2023-07-14 NOTE — Assessment & Plan Note (Signed)
 Chronic, stable.  BP at goal in office today.  Valsartan  for kidney protection, urine ALB 05 February 2023.  Tolerating well, will continue this.  Recommend he monitor BP at least a few mornings a week at home and document.  DASH diet at home.  Continue current medication regimen and adjust as needed. Labs today: CBC, CMP, TSH.

## 2023-07-14 NOTE — Assessment & Plan Note (Signed)
 Refer to diabetes with obesity plan of care.

## 2023-07-14 NOTE — Assessment & Plan Note (Signed)
 BMI 37.60 with T2DM, HTN, HLD.  Recommended eating smaller high protein, low fat meals more frequently and exercising 30 mins a day 5 times a week with a goal of 10-15lb weight loss in the next 3 months. Patient voiced their understanding and motivation to adhere to these recommendations.

## 2023-07-14 NOTE — Assessment & Plan Note (Signed)
Noted on echo, continue collaboration with cardiology as needed.  Asymptomatic.

## 2023-07-15 ENCOUNTER — Ambulatory Visit: Payer: Self-pay | Admitting: Nurse Practitioner

## 2023-07-15 LAB — CBC WITH DIFFERENTIAL/PLATELET
Basophils Absolute: 0.1 x10E3/uL (ref 0.0–0.2)
Basos: 1 %
EOS (ABSOLUTE): 0.2 x10E3/uL (ref 0.0–0.4)
Eos: 3 %
Hematocrit: 42.3 % (ref 37.5–51.0)
Hemoglobin: 13.7 g/dL (ref 13.0–17.7)
Immature Grans (Abs): 0 x10E3/uL (ref 0.0–0.1)
Immature Granulocytes: 0 %
Lymphocytes Absolute: 3 x10E3/uL (ref 0.7–3.1)
Lymphs: 48 %
MCH: 32.9 pg (ref 26.6–33.0)
MCHC: 32.4 g/dL (ref 31.5–35.7)
MCV: 101 fL — ABNORMAL HIGH (ref 79–97)
Monocytes Absolute: 0.4 x10E3/uL (ref 0.1–0.9)
Monocytes: 7 %
Neutrophils Absolute: 2.5 x10E3/uL (ref 1.4–7.0)
Neutrophils: 41 %
Platelets: 214 x10E3/uL (ref 150–450)
RBC: 4.17 x10E6/uL (ref 4.14–5.80)
RDW: 13.1 % (ref 11.6–15.4)
WBC: 6.1 x10E3/uL (ref 3.4–10.8)

## 2023-07-15 LAB — COMPREHENSIVE METABOLIC PANEL WITH GFR
ALT: 45 IU/L — ABNORMAL HIGH (ref 0–44)
AST: 32 IU/L (ref 0–40)
Albumin: 4.4 g/dL (ref 3.9–4.9)
Alkaline Phosphatase: 106 IU/L (ref 44–121)
BUN/Creatinine Ratio: 15 (ref 10–24)
BUN: 15 mg/dL (ref 8–27)
Bilirubin Total: 0.6 mg/dL (ref 0.0–1.2)
CO2: 21 mmol/L (ref 20–29)
Calcium: 9.6 mg/dL (ref 8.6–10.2)
Chloride: 99 mmol/L (ref 96–106)
Creatinine, Ser: 1.03 mg/dL (ref 0.76–1.27)
Globulin, Total: 2.7 g/dL (ref 1.5–4.5)
Glucose: 185 mg/dL — ABNORMAL HIGH (ref 70–99)
Potassium: 4.4 mmol/L (ref 3.5–5.2)
Sodium: 138 mmol/L (ref 134–144)
Total Protein: 7.1 g/dL (ref 6.0–8.5)
eGFR: 79 mL/min/1.73 (ref >59)

## 2023-07-15 LAB — LIPID PANEL W/O CHOL/HDL RATIO
Cholesterol, Total: 110 mg/dL (ref 100–199)
HDL: 30 mg/dL — ABNORMAL LOW (ref >39)
LDL Chol Calc (NIH): 57 mg/dL (ref 0–99)
Triglycerides: 126 mg/dL (ref 0–149)
VLDL Cholesterol Cal: 23 mg/dL (ref 5–40)

## 2023-07-15 LAB — GAMMA GT: GGT: 55 IU/L (ref 0–65)

## 2023-07-15 LAB — TSH: TSH: 2.17 u[IU]/mL (ref 0.450–4.500)

## 2023-07-15 LAB — PSA: Prostate Specific Ag, Serum: 0.7 ng/mL (ref 0.0–4.0)

## 2023-07-15 NOTE — Progress Notes (Signed)
 Contacted via MyChart  Good afternoon Ryan Schmidt, your labs have returned: - CBC shows no anemia or infection. - Kidney function, creatinine and eGFR, remains normal. Liver function, AST and ALT, shows very mild elevation in ALT but these have trended down from last visit.  - Lipid panel looks great!! - Remainder of labs stable.  No changes needed.  Any questions? Keep being amazing!!  Thank you for allowing me to participate in your care.  I appreciate you. Kindest regards, Phuc Kluttz

## 2023-07-17 ENCOUNTER — Telehealth: Payer: Self-pay | Admitting: Nurse Practitioner

## 2023-07-17 NOTE — Telephone Encounter (Signed)
 Copied from CRM [id number]. Topic: Medicare AWV >> Jul 17, 2023  2:54 PM Nathanel DEL wrote: Reason for CRM: Called 07/17/2023 to sched AWV - NO VOICEMAIL  Nathanel Paschal; Care Guide Ambulatory Clinical Support [place] [facility] Direct Dial: [phone]

## 2023-08-05 ENCOUNTER — Telehealth: Payer: Self-pay

## 2023-08-05 NOTE — Telephone Encounter (Unsigned)
 Copied from CRM [id number]. Topic: Clinical - Prescription Issue >> Aug 05, 2023  1:33 PM Turkey B wrote: Reason for CRM: patient called I states the, Blood Glucose Monitoring Suppl (ONETOUCH VERIO) w/Device KIT isn't covered under his insurance anymore

## 2023-08-06 MED ORDER — ACCU-CHEK SOFTCLIX LANCETS MISC: 1.0000 | Freq: Two times a day (BID) | 3 refills | Status: AC

## 2023-08-06 MED ORDER — ACCU-CHEK GUIDE ME W/DEVICE KIT: 1.0000 | PACK | Freq: Two times a day (BID) | 0 refills | Status: AC

## 2023-08-06 MED ORDER — ACCU-CHEK GUIDE TEST VI STRP: 1.0000 | ORAL_STRIP | Freq: Two times a day (BID) | 3 refills | Status: AC

## 2023-08-06 NOTE — Telephone Encounter (Signed)
 Prescription's t'd up for Accu-Chek supplies to be sent in.

## 2023-08-28 ENCOUNTER — Other Ambulatory Visit: Payer: Self-pay | Admitting: Family Medicine

## 2023-08-31 NOTE — Telephone Encounter (Signed)
 Requested Prescriptions  Refused Prescriptions Disp Refills   Blood Glucose Monitoring Suppl (ACCU-CHEK GUIDE ME) w/Device KIT [Pharmacy Med Name: ACCU-CHEK GUIDE ME METER W/DEVICE Kit] 1 kit 11    Sig: USE TO TEST BLOOD SUGAR TWICE DAILY     Endocrinology: Diabetes - Testing Supplies Passed - 08/31/2023 12:30 PM      Passed - Valid encounter within last 12 months    Recent Outpatient Visits           1 month ago Encounter for [employer] annual wellness exam   [facility] [place], Melanie DASEN, NP

## 2023-10-31 DIAGNOSIS — M545 Low back pain, unspecified: Secondary | ICD-10-CM | POA: Diagnosis not present

## 2023-10-31 DIAGNOSIS — M533 Sacrococcygeal disorders, not elsewhere classified: Secondary | ICD-10-CM | POA: Diagnosis not present

## 2023-12-16 ENCOUNTER — Ambulatory Visit: Payer: Self-pay

## 2023-12-16 NOTE — Telephone Encounter (Signed)
 FYI Only or Action Required?: FYI only for provider: appointment scheduled on 12/11.  Patient was last seen in primary care on 07/14/2023 by Valerio Melanie DASEN, NP.  Called Nurse Triage reporting Back Pain.  Symptoms began several weeks ago. Recently seen in ED for similar pain.  No recent X-rays or imaging per patient.   Interventions attempted: OTC medications: Tylenol , Prescription medications: Baclofen , was recently given pain medication and possibly prednisone  in ED (names unknown), Rest, hydration, or home remedies, and Ice/heat application.  Symptoms are: gradually worsening.  Triage Disposition: See Physician Within 24 Hours  Patient/caregiver understands and will follow disposition?: Yes     Copied from CRM #[id number]. Topic: Clinical - Red Word Triage >> Dec 16, 2023  2:13 PM Fonda T wrote: Red Word that prompted transfer to Nurse Triage: Pt calling stating he is having worsening back pain, along with stiffness.  Pt requesting an appt for evaluation.  Pt ph. [phone]   Reason for Disposition  MODERATE pain (e.g., interferes with normal activities or awakens from sleep)  Diabetes mellitus or weak immune system (e.g., HIV positive, cancer chemo, splenectomy, organ transplant, chronic steroids)  (Exception: Mild pain that is only present with movement.)  Answer Assessment - Initial Assessment Questions 1. ONSET: When did the pain begin? (e.g., minutes, hours, days)     About a week and a half ago.   Was seen in ED about a month ago for similar pain and is now recurring. No past history of this pain prior to ED visit.  No scans or X-rays were done in ED per patient.   2. LOCATION: Where does it hurt? (upper, mid or lower back)     R) side, lower back starting just under rib cage and going down toward buttock.   3. SEVERITY: How bad is the pain?  (e.g., Scale 1-10; mild, moderate, or severe)   6/10 today, pain and stiffness; becoming more intense again; unable to  sleep on L) side due to history of trauma thus sleeps on right side or back and is having difficulty sleeping due to pain; pain also occurs when sitting on couch     4. PATTERN: Is the pain constant? (e.g., yes, no; constant, intermittent)      Really hurts when waking up in the morning.   5. RADIATION: Does the pain shoot into your legs or somewhere else?     No radiation.  Only located at lower part of back on R) side and does not radiate to legs or elsewhere.   6. CAUSE:  What do you think is causing the back pain?      When getting up or sitting down it seems to get worse.  Believes is muscular.   7. BACK OVERUSE:  Any recent lifting of heavy objects, strenuous work or exercise?     Did go to grocery store on Thanksgiving and picked up food.  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  Heaviest item was Turkey when lifting that day, spent about 6 or 7 hours delivering food; patient reports symptoms started to come back after that time.   8. MEDICINES: What have you taken so far for the pain? (e.g., nothing, acetaminophen , NSAIDS)     Tylenol -2 tabs don't work, taking PM or Extra Strength forms at night  9. NEUROLOGIC SYMPTOMS: Do you have any weakness, numbness, or problems with bowel/bladder control?     Denies  10. OTHER SYMPTOMS: Do you have any other symptoms? (e.g., fever,  abdomen pain, burning with urination, blood in urine)       No pain or burning with urination.  Denies history of kidney stones.  No frequnecy or urgency.  No blood observed in urine. No abdominal pain or fever.  No nausea or vomiting.  Went to ED one month ago due to intensity of pain (agonizing), was given pain pills and a tapered medication, possibly prednisone  but uncertain. These medications took the pain away but it is coming back now. It is not as intense today as it was then but gradually worsening.  Takes Baclofen  and Metformin .  Warm showers have not been effective.   See  alternative protocol.  Answer Assessment - Initial Assessment Questions See alternative protocol.    CAUSE: What do you think is causing the pain?     Believes this is more muscular patient states.    OTHER SYMPTOMS:  Do you have any other symptoms? (e.g., fever, abdomen pain, vomiting, leg weakness, burning with urination, blood in urine)     Denies.  Protocols used: Back Pain-A-AH, Flank Pain-A-AH

## 2023-12-17 ENCOUNTER — Ambulatory Visit: Admitting: Pediatrics

## 2023-12-17 ENCOUNTER — Encounter: Payer: Self-pay | Admitting: Pediatrics

## 2023-12-17 VITALS — BP 139/74 | HR 66 | Temp 98.3°F | Ht 69.02 in | Wt 254.6 lb

## 2023-12-17 DIAGNOSIS — M545 Low back pain, unspecified: Secondary | ICD-10-CM

## 2023-12-17 DIAGNOSIS — Z23 Encounter for immunization: Secondary | ICD-10-CM

## 2023-12-17 DIAGNOSIS — G8929 Other chronic pain: Secondary | ICD-10-CM

## 2023-12-17 MED ORDER — MELOXICAM 15 MG PO TABS: 15.0000 mg | ORAL_TABLET | Freq: Every day | ORAL | 0 refills | Status: DC

## 2023-12-17 NOTE — Progress Notes (Signed)
 Office Visit  BP 139/74 (BP Location: Right Arm, Patient Position: Sitting, Cuff Size: Large)   Pulse 66   Temp 98.3 F (36.8 C) (Oral)   Ht 5' 9.02 (1.753 m)   Wt 254 lb 9.6 oz (115.5 kg)   SpO2 96%   BMI 37.58 kg/m    Subjective:    Patient ID: Ryan Schmidt, male    DOB: Apr 22, 1955, 68 y.o.   MRN: [id number]  HPI: Ryan Schmidt is a 68 y.o. male  Chief Complaint  Patient presents with   Back Pain    Patient stated it started in October, and he went to the ER. They gave him pain pills.    Discussed the use of AI scribe software for clinical note transcription with the patient, who gave verbal consent to proceed.  History of Present Illness   Ryan Schmidt is a 68 year old male with hyperlipidemia and diabetes who presents with right-sided pain.  He has been experiencing right-sided pain since October. The pain is localized to the right side and does not radiate. It is constant and uncomfortable, particularly when sitting or lying down, affecting his ability to sleep. The pain was severe enough to seek emergency care, where he was prescribed tramadol , but he opted to take Tylenol  instead. He also received prednisone , which he did take.  The pain interferes with his sleep, causing him to go to bed late and wake up early, resulting in insufficient rest. He attributes his sleep disturbances to the pain, as he wakes up still in pain and sometimes worse than before. He is frustrated with his mattress, which he feels does not alleviate his discomfort.  In terms of social history, he volunteers at a food bank, which involves lifting grocery bags, but he does not believe this activity is the cause of his pain. He takes cholesterol medication and metformin , indicating a history of hyperlipidemia and diabetes.  No left-sided pain. Reports difficulty sleeping due to pain.      Relevant past medical, surgical, family and social history reviewed and updated as indicated. Interim medical  history since our last visit reviewed. Allergies and medications reviewed and updated.  ROS per HPI unless specifically indicated above     Objective:    BP 139/74 (BP Location: Right Arm, Patient Position: Sitting, Cuff Size: Large)   Pulse 66   Temp 98.3 F (36.8 C) (Oral)   Ht 5' 9.02 (1.753 m)   Wt 254 lb 9.6 oz (115.5 kg)   SpO2 96%   BMI 37.58 kg/m   Wt Readings from Last 3 Encounters:  12/17/23 254 lb 9.6 oz (115.5 kg)  07/14/23 254 lb 9.6 oz (115.5 kg)  01/13/23 261 lb 12.8 oz (118.8 kg)     Physical Exam Constitutional:      Appearance: Normal appearance.  Pulmonary:     Effort: Pulmonary effort is normal.  Musculoskeletal:        General: Normal range of motion.     Cervical back: Normal.     Thoracic back: Normal.     Lumbar back: Tenderness present. No bony tenderness. Negative right straight leg raise test and negative left straight leg raise test.       Back:  Skin:    Comments: Normal skin color  Neurological:     General: No focal deficit present.     Mental Status: He is alert. Mental status is at baseline.  Psychiatric:        Mood and Affect: Mood  normal.        Behavior: Behavior normal.        Thought Content: Thought content normal.         12/17/2023    9:54 AM 07/14/2023    8:55 AM 01/13/2023    8:54 AM 12/09/2022    9:17 AM 10/03/2022    7:59 AM  Depression screen PHQ 2/9  Decreased Interest 0 0 0 0 0  Down, Depressed, Hopeless 1 1 1  0 0  PHQ - 2 Score 1 1 1  0 0  Altered sleeping 2 0 2 2 0  Tired, decreased energy 0 0 0 0 0  Change in appetite 0 0 0 0 0  Feeling bad or failure about yourself  0 0 0 0 0  Trouble concentrating 0 0 0 0 0  Moving slowly or fidgety/restless 0 0 0 0 0  Suicidal thoughts 0 0 0 0 0  PHQ-9 Score 3 1  3  2   0   Difficult doing work/chores Not difficult at all Not difficult at all Not difficult at all Not difficult at all Not difficult at all     Data saved with a previous flowsheet row definition        12/17/2023    9:55 AM 07/14/2023    8:56 AM 01/13/2023    8:54 AM 12/09/2022    9:17 AM  GAD 7 : Generalized Anxiety Score  Nervous, Anxious, on Edge 0 0 0 0  Control/stop worrying 0 0 0 0  Worry too much - different things 0 1 0 0  Trouble relaxing 0 0 0 0  Restless 0 0 0 0  Easily annoyed or irritable 0 0 0 0  Afraid - awful might happen 0 0 0 0  Total GAD 7 Score 0 1 0 0  Anxiety Difficulty Not difficult at all Not difficult at all Not difficult at all Not difficult at all       Assessment & Plan:  Assessment & Plan   Chronic right-sided low back pain without sciatica Pain likely due to inflammation. No red flag sx though I am concerned severe enough to interfere with sleep. ER gave pred did not take tramadol . Meloxicam preferred for its anti-inflammatory properties. If not improving, consider xray.  - Prescribed meloxicam for 14 days. - Advised taking meloxicam with dinner. - Continue Tylenol  as needed. - Instructed to contact office if pain does not improve or worsens. - Scheduled follow-up with PCP in January.  -     Meloxicam; Take 1 tablet (15 mg total) by mouth daily.  Dispense: 14 tablet; Refill: 0  Flu vaccine need -     Flu vaccine HIGH DOSE PF(Fluzone Trivalent)   Follow up plan: Return if symptoms worsen or fail to improve.  Hadassah SHAUNNA Nett, MD    Approximately 30 minutes spent on patient encounter today including assessment, counseling, diagnosing, treatment plan development, and charting.

## 2023-12-17 NOTE — Patient Instructions (Signed)
 Take meloxicam 15mg  once daily with dinner for 7-14 days

## 2023-12-17 NOTE — Telephone Encounter (Signed)
Scheduled for 940am

## 2023-12-17 NOTE — Telephone Encounter (Signed)
 Noted

## 2024-01-09 NOTE — Patient Instructions (Signed)

## 2024-01-14 ENCOUNTER — Ambulatory Visit: Admitting: Nurse Practitioner

## 2024-01-14 ENCOUNTER — Encounter: Payer: Self-pay | Admitting: Nurse Practitioner

## 2024-01-14 VITALS — BP 118/70 | HR 90 | Temp 98.1°F | Resp 18 | Ht 69.02 in | Wt 254.0 lb

## 2024-01-14 DIAGNOSIS — Z7984 Long term (current) use of oral hypoglycemic drugs: Secondary | ICD-10-CM | POA: Diagnosis not present

## 2024-01-14 DIAGNOSIS — G709 Myoneural disorder, unspecified: Secondary | ICD-10-CM

## 2024-01-14 DIAGNOSIS — E1129 Type 2 diabetes mellitus with other diabetic kidney complication: Secondary | ICD-10-CM | POA: Diagnosis not present

## 2024-01-14 DIAGNOSIS — M79671 Pain in right foot: Secondary | ICD-10-CM | POA: Diagnosis not present

## 2024-01-14 DIAGNOSIS — E1159 Type 2 diabetes mellitus with other circulatory complications: Secondary | ICD-10-CM

## 2024-01-14 DIAGNOSIS — E119 Type 2 diabetes mellitus without complications: Secondary | ICD-10-CM

## 2024-01-14 DIAGNOSIS — E1169 Type 2 diabetes mellitus with other specified complication: Secondary | ICD-10-CM | POA: Diagnosis not present

## 2024-01-14 DIAGNOSIS — I340 Nonrheumatic mitral (valve) insufficiency: Secondary | ICD-10-CM

## 2024-01-14 DIAGNOSIS — E669 Obesity, unspecified: Secondary | ICD-10-CM

## 2024-01-14 DIAGNOSIS — I152 Hypertension secondary to endocrine disorders: Secondary | ICD-10-CM | POA: Diagnosis not present

## 2024-01-14 DIAGNOSIS — R809 Proteinuria, unspecified: Secondary | ICD-10-CM

## 2024-01-14 LAB — MICROALBUMIN, URINE WAIVED
Creatinine, Urine Waived: 300 mg/dL (ref 10–300)
Microalb, Ur Waived: 80 mg/L — ABNORMAL HIGH (ref 0–19)

## 2024-01-14 LAB — BAYER DCA HB A1C WAIVED: HB A1C (BAYER DCA - WAIVED): 6.8 % — ABNORMAL HIGH (ref 4.8–5.6)

## 2024-01-14 MED ORDER — MELOXICAM 15 MG PO TABS: 15.0000 mg | ORAL_TABLET | Freq: Every day | ORAL | 0 refills | Status: AC

## 2024-01-14 NOTE — Assessment & Plan Note (Signed)
Noted on echo, continue collaboration with cardiology as needed.  Asymptomatic.

## 2024-01-14 NOTE — Progress Notes (Signed)
 "  BP 118/70 (BP Location: Left Arm, Patient Position: Sitting, Cuff Size: Normal)   Pulse 90   Temp 98.1 F (36.7 C) (Oral)   Resp 18   Ht 5' 9.02 (1.753 m)   Wt 254 lb (115.2 kg)   SpO2 94%   BMI 37.49 kg/m    Subjective:    Patient ID: Ryan Schmidt, male    DOB: [birth year], 69 y.o.   MRN: [id number]  HPI: Ryan Schmidt is a 69 y.o. male  Chief Complaint  Patient presents with   Pain/Muscular Disorder    Lower back pain and pain in feet.     HTN/HLD   Diabetes    Also brought blood sugar chartings, states sugar is still high and he's not sure why.     DIABETES Diagnosed 02/21/21 when A1c was 9.9%. July A1c 6.9%.  Taking Metformin  XR 1000 MG BID. Sugars have been running high per patient, reports he has been trying with his diet over the holidays. Has cut back on sweets, stopped eating ice cream. Hypoglycemic episodes:no Polydipsia/polyuria: no Visual disturbance: no Chest pain: no Paresthesias: no Glucose Monitoring: yes             Accucheck frequency: daily             Fasting glucose: 170 to 218             Post prandial:             Evening: 176 to 387             Before meals: Taking Insulin?: no             Long acting insulin:             Short acting insulin: Blood Pressure Monitoring: a few times a week Retinal Examination: Up to Date = Dr. Newt New Exam: Up to Date Pneumovax: Up to Date Influenza: Up to Date Aspirin: no    HYPERTENSION / HYPERLIPIDEMIA Taking Valsartan  and Atorvastatin  daily. Last cardiology visit was 04/23/23, they did a stress test.  Echo 04/20/23 with trivial MR noted, but normal LV function and EF >50%.  Satisfied with current treatment? yes Duration of hypertension: chronic BP monitoring frequency: 1-2 times a week BP range: on average <130/80 BP medication side effects: no Duration of hyperlipidemia: chronic Cholesterol medication side effects: no Cholesterol supplements: none Medication compliance: good  compliance Aspirin: no Recent stressors: no Recurrent headaches: no Visual changes: no Palpitations: no Dyspnea: no Chest pain: no Lower extremity edema: no Dizzy/lightheaded: no  The ASCVD Risk score (Arnett DK, et al., 2019) failed to calculate for the following reasons:   The valid total cholesterol range is 130 to 320 mg/dL  NEUROMUSCULAR DISORDER Takes Baclofen  10 MG TID with benefit, takes at bedtime and in morning.  Continues Vitamin D  daily. His right foot has been hurting for two months and cannot do his walks. Last saw podiatry 07/30/22. Hurting on the front and the lateral aspect. No wounds reported. Reports pain to foot 6/10 at worst. Slippers cause pain to be worse and movement. Soaking them helps pain. Describes pain as sharp and aching. Pain control status: stable Duration: chronic Location: in calves and up into sides Quality: dull, aching, cramping, and throbbing Current Pain Level: 5/10 Previous Pain Level: 10/10 What Activities task can be accomplished with current medication? Can perform daily ADL's Stool softners/OTC fiber: no  Previous pain specialty evaluation: no Non-narcotic analgesic meds: yes  Relevant past  medical, surgical, family and social history reviewed and updated as indicated. Interim medical history since our last visit reviewed. Allergies and medications reviewed and updated.  Review of Systems  Constitutional:  Negative for activity change, diaphoresis, fatigue and fever.  Respiratory:  Negative for cough, chest tightness, shortness of breath and wheezing.   Cardiovascular:  Negative for chest pain, palpitations and leg swelling.  Gastrointestinal: Negative.   Musculoskeletal:  Positive for arthralgias.  Neurological: Negative.   Psychiatric/Behavioral: Negative.      Per HPI unless specifically indicated above     Objective:    BP 118/70 (BP Location: Left Arm, Patient Position: Sitting, Cuff Size: Normal)   Pulse 90   Temp 98.1 F  (36.7 C) (Oral)   Resp 18   Ht 5' 9.02 (1.753 m)   Wt 254 lb (115.2 kg)   SpO2 94%   BMI 37.49 kg/m   Wt Readings from Last 3 Encounters:  01/14/24 254 lb (115.2 kg)  12/17/23 254 lb 9.6 oz (115.5 kg)  07/14/23 254 lb 9.6 oz (115.5 kg)    Physical Exam Vitals and nursing note reviewed.  Constitutional:      General: He is awake. He is not in acute distress.    Appearance: He is well-developed and well-groomed. He is obese. He is not ill-appearing or toxic-appearing.  HENT:     Head: Normocephalic.     Right Ear: Hearing and external ear normal.     Left Ear: Hearing and external ear normal.  Eyes:     General: Lids are normal.     Extraocular Movements: Extraocular movements intact.     Conjunctiva/sclera: Conjunctivae normal.  Neck:     Thyroid : No thyromegaly.     Vascular: No carotid bruit.  Cardiovascular:     Rate and Rhythm: Normal rate and regular rhythm.     Heart sounds: Murmur heard.     Systolic murmur is present with a grade of 2/6.     No gallop.  Pulmonary:     Effort: No accessory muscle usage or respiratory distress.     Breath sounds: Normal breath sounds. No decreased breath sounds, wheezing or rales.  Abdominal:     General: Bowel sounds are normal. There is no distension.     Palpations: Abdomen is soft.     Tenderness: There is no abdominal tenderness.  Musculoskeletal:     Cervical back: Full passive range of motion without pain.     Right lower leg: No edema.     Left lower leg: No edema.  Lymphadenopathy:     Cervical: No cervical adenopathy.  Skin:    General: Skin is warm.     Capillary Refill: Capillary refill takes less than 2 seconds.  Neurological:     Mental Status: He is alert and oriented to person, place, and time.     Deep Tendon Reflexes: Reflexes are normal and symmetric.     Reflex Scores:      Brachioradialis reflexes are 2+ on the right side and 2+ on the left side.      Patellar reflexes are 2+ on the right side and 2+  on the left side. Psychiatric:        Attention and Perception: Attention normal.        Mood and Affect: Mood normal.        Speech: Speech normal.        Behavior: Behavior normal. Behavior is cooperative.  Thought Content: Thought content normal.    Diabetic Foot Exam - Simple   Simple Foot Form Visual Inspection See comments: Yes Sensation Testing Intact to touch and monofilament testing bilaterally: Yes Pulse Check Posterior Tibialis and Dorsalis pulse intact bilaterally: Yes Comments Pulses 2+ PT and DP. Sensation intact. Very dry skin and fungal disease to feet. Mild tenderness inner aspect of great toe, none to lateral aspect of foot. No wounds.     Results for orders placed or performed in visit on 07/14/23  Bayer DCA Hb A1c Waived   Collection Time: 07/14/23  8:54 AM  Result Value Ref Range   HB A1C (BAYER DCA - WAIVED) 6.9 (H) 4.8 - 5.6 %  CBC with Differential/Platelet   Collection Time: 07/14/23  8:55 AM  Result Value Ref Range   WBC 6.1 3.4 - 10.8 x10E3/uL   RBC 4.17 4.14 - 5.80 x10E6/uL   Hemoglobin 13.7 13.0 - 17.7 g/dL   Hematocrit 57.6 62.4 - 51.0 %   MCV 101 (H) 79 - 97 fL   MCH 32.9 26.6 - 33.0 pg   MCHC 32.4 31.5 - 35.7 g/dL   RDW 86.8 88.3 - 84.5 %   Platelets 214 150 - 450 x10E3/uL   Neutrophils 41 Not Estab. %   Lymphs 48 Not Estab. %   Monocytes 7 Not Estab. %   Eos 3 Not Estab. %   Basos 1 Not Estab. %   Neutrophils Absolute 2.5 1.4 - 7.0 x10E3/uL   Lymphocytes Absolute 3.0 0.7 - 3.1 x10E3/uL   Monocytes Absolute 0.4 0.1 - 0.9 x10E3/uL   EOS (ABSOLUTE) 0.2 0.0 - 0.4 x10E3/uL   Basophils Absolute 0.1 0.0 - 0.2 x10E3/uL   Immature Granulocytes 0 Not Estab. %   Immature Grans (Abs) 0.0 0.0 - 0.1 x10E3/uL  Comprehensive metabolic panel with GFR   Collection Time: 07/14/23  8:55 AM  Result Value Ref Range   Glucose 185 (H) 70 - 99 mg/dL   BUN 15 8 - 27 mg/dL   Creatinine, Ser 8.96 0.76 - 1.27 mg/dL   eGFR 79 >40 fO/fpw/8.26    BUN/Creatinine Ratio 15 10 - 24   Sodium 138 134 - 144 mmol/L   Potassium 4.4 3.5 - 5.2 mmol/L   Chloride 99 96 - 106 mmol/L   CO2 21 20 - 29 mmol/L   Calcium  9.6 8.6 - 10.2 mg/dL   Total Protein 7.1 6.0 - 8.5 g/dL   Albumin 4.4 3.9 - 4.9 g/dL   Globulin, Total 2.7 1.5 - 4.5 g/dL   Bilirubin Total 0.6 0.0 - 1.2 mg/dL   Alkaline Phosphatase 106 44 - 121 IU/L   AST 32 0 - 40 IU/L   ALT 45 (H) 0 - 44 IU/L  Lipid Panel w/o Chol/HDL Ratio   Collection Time: 07/14/23  8:55 AM  Result Value Ref Range   Cholesterol, Total 110 100 - 199 mg/dL   Triglycerides 873 0 - 149 mg/dL   HDL 30 (L) >60 mg/dL   VLDL Cholesterol Cal 23 5 - 40 mg/dL   LDL Chol Calc (NIH) 57 0 - 99 mg/dL  PSA   Collection Time: 07/14/23  8:55 AM  Result Value Ref Range   Prostate Specific Ag, Serum 0.7 0.0 - 4.0 ng/mL  TSH   Collection Time: 07/14/23  8:55 AM  Result Value Ref Range   TSH 2.170 0.450 - 4.500 uIU/mL  Gamma GT   Collection Time: 07/14/23  8:55 AM  Result Value Ref Range  GGT 55 0 - 65 IU/L      Assessment & Plan:   Problem List Items Addressed This Visit       Cardiovascular and Mediastinum   Nonrheumatic mitral valve regurgitation   Noted on echo, continue collaboration with cardiology as needed.  Asymptomatic.      Hypertension associated with diabetes (HCC)   Chronic, stable.  BP at goal in office today.  Valsartan  for kidney protection, urine ALB 05 February 2023.  Tolerating well, will continue this.  Recommend he monitor BP at least a few mornings a week at home and document.  DASH diet at home.  Continue current medication regimen and adjust as needed. Labs today: CMP and urine ALB.       Relevant Orders   Bayer DCA Hb A1c Waived   Microalbumin, Urine Waived   Comprehensive metabolic panel with GFR     Endocrine   Type 2 diabetes mellitus with proteinuria (HCC)   Diagnosed 02/21/21 with A1c 9.9%.  A1c 6.8% today, remaining stable although BS levels are elevated often.  Urine ALB  05 February 2023 (recheck today), continue Valsartan  for kidney protection. Recommend he check sugars twice a day, first thing in morning with goal <130 and one time two hours after a meal with goal <180.  Educated on diabetic diet and recommend focus on this.  Recommend he schedule with podiatry, who he is established with, to check on right foot and if orthotics are needed. - Eye exam up to date. - Foot exam up to date - Vaccinations up to date. - ARB and Statin on board      Relevant Orders   Bayer DCA Hb A1c Waived   Type 2 diabetes mellitus in patient with obesity (HCC) - Primary   Diagnosed 02/21/21 with A1c 9.9%.  A1c 6.8% today, remaining stable although BS levels are elevated often.  Urine ALB 05 February 2023 (recheck today), continue Valsartan  for kidney protection. Recommend he check sugars twice a day, first thing in morning with goal <130 and one time two hours after a meal with goal <180.  Educated on diabetic diet and recommend focus on this.  Recommend he schedule with podiatry, who he is established with, to check on right foot and if orthotics are needed. - Eye exam up to date. - Foot exam up to date - Vaccinations up to date. - ARB and Statin on board      Relevant Orders   Bayer DCA Hb A1c Waived   Hyperlipidemia associated with type 2 diabetes mellitus (HCC)   Chronic, ongoing.  Continue statin daily and adjust dosing as needed. Lipid panel today.      Relevant Orders   Bayer DCA Hb A1c Waived   Comprehensive metabolic panel with GFR   Lipid Panel w/o Chol/HDL Ratio   Diabetes mellitus treated with oral medication (HCC)   Refer to diabetes with obesity plan of care.      Relevant Orders   Bayer DCA Hb A1c Waived     Nervous and Auditory   Neuromuscular disorder (HCC)   Chronic, ongoing.  Continue Baclofen , recommend he take this as ordered to prevent issues while walking.  He declines referral to neurology or PT at this time.          Other   Right foot  pain   Overall reassuring exam. Will check uric acid level to ensure no gout. Suspect he may need orthotics, recommend he follow-up with podiatry and check on this.  He plans to do so. Meloxicam  sent in for 14 day supply only as this has helped his pain in past. Educated on this.      Relevant Medications   meloxicam  (MOBIC ) 15 MG tablet   Other Relevant Orders   Uric acid   Morbid obesity (HCC)   BMI 37.49 with T2DM, HTN, HLD.  Recommended eating smaller high protein, low fat meals more frequently and exercising 30 mins a day 5 times a week with a goal of 10-15lb weight loss in the next 3 months. Patient voiced their understanding and motivation to adhere to these recommendations.         Follow up plan: Return in about 6 months (around 07/13/2024) for Annual physical after 07/13/24.      "

## 2024-01-14 NOTE — Assessment & Plan Note (Signed)
Chronic, ongoing.  Continue statin daily and adjust dosing as needed. Lipid panel today. 

## 2024-01-14 NOTE — Assessment & Plan Note (Signed)
 Overall reassuring exam. Will check uric acid level to ensure no gout. Suspect he may need orthotics, recommend he follow-up with podiatry and check on this. He plans to do so. Meloxicam  sent in for 14 day supply only as this has helped his pain in past. Educated on this.

## 2024-01-14 NOTE — Assessment & Plan Note (Signed)
 BMI 37.49 with T2DM, HTN, HLD.  Recommended eating smaller high protein, low fat meals more frequently and exercising 30 mins a day 5 times a week with a goal of 10-15lb weight loss in the next 3 months. Patient voiced their understanding and motivation to adhere to these recommendations.

## 2024-01-14 NOTE — Assessment & Plan Note (Signed)
 Diagnosed 02/21/21 with A1c 9.9%.  A1c 6.8% today, remaining stable although BS levels are elevated often.  Urine ALB 05 February 2023 (recheck today), continue Valsartan  for kidney protection. Recommend he check sugars twice a day, first thing in morning with goal <130 and one time two hours after a meal with goal <180.  Educated on diabetic diet and recommend focus on this.  Recommend he schedule with podiatry, who he is established with, to check on right foot and if orthotics are needed. - Eye exam up to date. - Foot exam up to date - Vaccinations up to date. - ARB and Statin on board

## 2024-01-14 NOTE — Assessment & Plan Note (Signed)
Chronic, ongoing.  Continue Baclofen, recommend he take this as ordered to prevent issues while walking.  He declines referral to neurology or PT at this time.

## 2024-01-14 NOTE — Assessment & Plan Note (Signed)
 Chronic, stable.  BP at goal in office today.  Valsartan  for kidney protection, urine ALB 05 February 2023.  Tolerating well, will continue this.  Recommend he monitor BP at least a few mornings a week at home and document.  DASH diet at home.  Continue current medication regimen and adjust as needed. Labs today: CMP and urine ALB.

## 2024-01-14 NOTE — Assessment & Plan Note (Signed)
 Refer to diabetes with obesity plan of care.

## 2024-01-15 ENCOUNTER — Ambulatory Visit: Payer: Self-pay | Admitting: Nurse Practitioner

## 2024-01-15 LAB — COMPREHENSIVE METABOLIC PANEL WITH GFR
ALT: 49 IU/L — ABNORMAL HIGH (ref 0–44)
AST: 37 IU/L (ref 0–40)
Albumin: 4.5 g/dL (ref 3.9–4.9)
Alkaline Phosphatase: 129 IU/L — ABNORMAL HIGH (ref 47–123)
BUN/Creatinine Ratio: 19 (ref 10–24)
BUN: 17 mg/dL (ref 8–27)
Bilirubin Total: 0.5 mg/dL (ref 0.0–1.2)
CO2: 23 mmol/L (ref 20–29)
Calcium: 9.6 mg/dL (ref 8.6–10.2)
Chloride: 102 mmol/L (ref 96–106)
Creatinine, Ser: 0.89 mg/dL (ref 0.76–1.27)
Globulin, Total: 2.7 g/dL (ref 1.5–4.5)
Glucose: 167 mg/dL — ABNORMAL HIGH (ref 70–99)
Potassium: 4.1 mmol/L (ref 3.5–5.2)
Sodium: 142 mmol/L (ref 134–144)
Total Protein: 7.2 g/dL (ref 6.0–8.5)
eGFR: 93 mL/min/1.73

## 2024-01-15 LAB — LIPID PANEL W/O CHOL/HDL RATIO
Cholesterol, Total: 109 mg/dL (ref 100–199)
HDL: 28 mg/dL — ABNORMAL LOW
LDL Chol Calc (NIH): 38 mg/dL (ref 0–99)
Triglycerides: 283 mg/dL — ABNORMAL HIGH (ref 0–149)
VLDL Cholesterol Cal: 43 mg/dL — ABNORMAL HIGH (ref 5–40)

## 2024-01-15 LAB — URIC ACID: Uric Acid: 6.1 mg/dL (ref 3.8–8.4)

## 2024-01-15 NOTE — Progress Notes (Signed)
 Contacted via MyChart  Good morning Savvas, your labs have returned: - Kidney function, creatinine and eGFR, remains normal. Liver function shows mild elevation in ALT still which we will continue to monitor closely. - Lipid panel shows LDL at goal, triglycerides a little elevated. Continue statin and focus on healthy diet. - Uric acid level is normal, no gout. Any questions? Keep being stellar!!  Thank you for allowing me to participate in your care.  I appreciate you. Kindest regards, Xayden Linsey

## 2024-01-18 NOTE — Progress Notes (Signed)
 Ryan Schmidt                                          MRN: [id number]   01/18/2024   The [facility] Quality Team Specialist reviewed this patient medical record for the purposes of chart review for care gap closure. The following were reviewed: abstraction for care gap closure-glycemic status assessment.    [facility] Quality Team

## 2024-07-13 ENCOUNTER — Ambulatory Visit: Admitting: Nurse Practitioner
